# Patient Record
Sex: Male | Born: 1935 | Race: White | Hispanic: No | Marital: Married | Smoking: Current every day smoker
Health system: Southern US, Community
[De-identification: ages and names within clinical notes are randomized; demographics above are authoritative.]

## PROBLEM LIST (undated history)

## (undated) DIAGNOSIS — C801 Malignant (primary) neoplasm, unspecified: Secondary | ICD-10-CM

## (undated) DIAGNOSIS — E119 Type 2 diabetes mellitus without complications: Secondary | ICD-10-CM

## (undated) DIAGNOSIS — A491 Streptococcal infection, unspecified site: Secondary | ICD-10-CM

## (undated) DIAGNOSIS — I10 Essential (primary) hypertension: Secondary | ICD-10-CM

## (undated) DIAGNOSIS — E785 Hyperlipidemia, unspecified: Secondary | ICD-10-CM

## (undated) DIAGNOSIS — E079 Disorder of thyroid, unspecified: Secondary | ICD-10-CM

## (undated) DIAGNOSIS — I219 Acute myocardial infarction, unspecified: Secondary | ICD-10-CM

## (undated) HISTORY — PX: SPLENECTOMY: SUR1306

## (undated) HISTORY — PX: CORONARY ARTERY BYPASS GRAFT: SHX141

## (undated) HISTORY — PX: PANCREAS SURGERY: SHX731

## (undated) HISTORY — PX: CORONARY ANGIOPLASTY WITH STENT PLACEMENT: SHX49

## (undated) HISTORY — PX: CARDIAC SURGERY: SHX584

## (undated) HISTORY — PX: CARDIAC ELECTROPHYSIOLOGY STUDY AND ABLATION: SHX1294

---

## 2004-05-08 ENCOUNTER — Ambulatory Visit: Payer: Self-pay | Admitting: Physician Assistant

## 2004-05-21 ENCOUNTER — Ambulatory Visit: Payer: Self-pay | Admitting: Pain Medicine

## 2004-06-15 ENCOUNTER — Ambulatory Visit: Payer: Self-pay | Admitting: Physician Assistant

## 2004-07-12 ENCOUNTER — Ambulatory Visit: Payer: Self-pay | Admitting: Physician Assistant

## 2004-07-23 ENCOUNTER — Ambulatory Visit: Payer: Self-pay | Admitting: Pain Medicine

## 2004-07-24 ENCOUNTER — Ambulatory Visit: Payer: Self-pay | Admitting: Pain Medicine

## 2004-08-14 ENCOUNTER — Ambulatory Visit: Payer: Self-pay | Admitting: Physician Assistant

## 2004-08-21 ENCOUNTER — Ambulatory Visit: Payer: Self-pay | Admitting: Pain Medicine

## 2004-08-30 ENCOUNTER — Ambulatory Visit: Payer: Self-pay | Admitting: Pain Medicine

## 2004-10-01 ENCOUNTER — Ambulatory Visit: Payer: Self-pay | Admitting: Pain Medicine

## 2005-02-27 ENCOUNTER — Ambulatory Visit: Payer: Self-pay | Admitting: Pain Medicine

## 2005-03-11 ENCOUNTER — Ambulatory Visit: Payer: Self-pay | Admitting: Pain Medicine

## 2005-03-12 ENCOUNTER — Ambulatory Visit: Payer: Self-pay | Admitting: Pain Medicine

## 2005-04-03 ENCOUNTER — Ambulatory Visit: Payer: Self-pay | Admitting: Physician Assistant

## 2005-04-16 ENCOUNTER — Ambulatory Visit: Payer: Self-pay | Admitting: Pain Medicine

## 2005-05-02 ENCOUNTER — Ambulatory Visit: Payer: Self-pay | Admitting: Pain Medicine

## 2005-05-15 ENCOUNTER — Ambulatory Visit: Payer: Self-pay | Admitting: Physician Assistant

## 2005-08-29 ENCOUNTER — Ambulatory Visit: Payer: Self-pay | Admitting: Physician Assistant

## 2005-09-16 ENCOUNTER — Ambulatory Visit: Payer: Self-pay | Admitting: Pain Medicine

## 2005-09-17 ENCOUNTER — Ambulatory Visit: Payer: Self-pay | Admitting: Pain Medicine

## 2005-10-09 ENCOUNTER — Ambulatory Visit: Payer: Self-pay | Admitting: Physician Assistant

## 2006-01-13 ENCOUNTER — Ambulatory Visit: Payer: Self-pay | Admitting: Pain Medicine

## 2006-01-21 ENCOUNTER — Ambulatory Visit: Payer: Self-pay | Admitting: Pain Medicine

## 2006-02-10 ENCOUNTER — Ambulatory Visit: Payer: Self-pay | Admitting: Physician Assistant

## 2006-03-05 ENCOUNTER — Ambulatory Visit: Payer: Self-pay | Admitting: Internal Medicine

## 2006-04-17 ENCOUNTER — Ambulatory Visit: Payer: Self-pay | Admitting: Gastroenterology

## 2006-12-12 ENCOUNTER — Emergency Department: Payer: Self-pay | Admitting: Emergency Medicine

## 2006-12-12 ENCOUNTER — Other Ambulatory Visit: Payer: Self-pay

## 2007-05-12 ENCOUNTER — Other Ambulatory Visit: Payer: Self-pay

## 2007-05-12 ENCOUNTER — Emergency Department: Payer: Self-pay | Admitting: Emergency Medicine

## 2007-06-10 ENCOUNTER — Other Ambulatory Visit: Payer: Self-pay

## 2007-06-10 ENCOUNTER — Emergency Department: Payer: Self-pay

## 2007-06-23 ENCOUNTER — Ambulatory Visit: Payer: Self-pay

## 2007-06-24 ENCOUNTER — Ambulatory Visit: Payer: Self-pay | Admitting: Pain Medicine

## 2007-07-14 ENCOUNTER — Ambulatory Visit: Payer: Self-pay | Admitting: Pain Medicine

## 2007-07-27 ENCOUNTER — Ambulatory Visit: Payer: Self-pay | Admitting: Physician Assistant

## 2007-08-12 ENCOUNTER — Ambulatory Visit: Payer: Self-pay | Admitting: General Practice

## 2007-08-19 ENCOUNTER — Ambulatory Visit: Payer: Self-pay | Admitting: General Practice

## 2007-09-02 ENCOUNTER — Ambulatory Visit: Payer: Self-pay | Admitting: Physician Assistant

## 2007-09-09 IMAGING — CR DG ABDOMEN 3V
1 series · 5 of 5 positions shown · non-contrast
Comparison: none

REASON FOR EXAM: vomiting, hx multiple abd surgery, rm 21
COMMENTS:

[Series 1: view not recorded · 0.17mm/px · 5 of 5 slices shown]
[im 1/5]
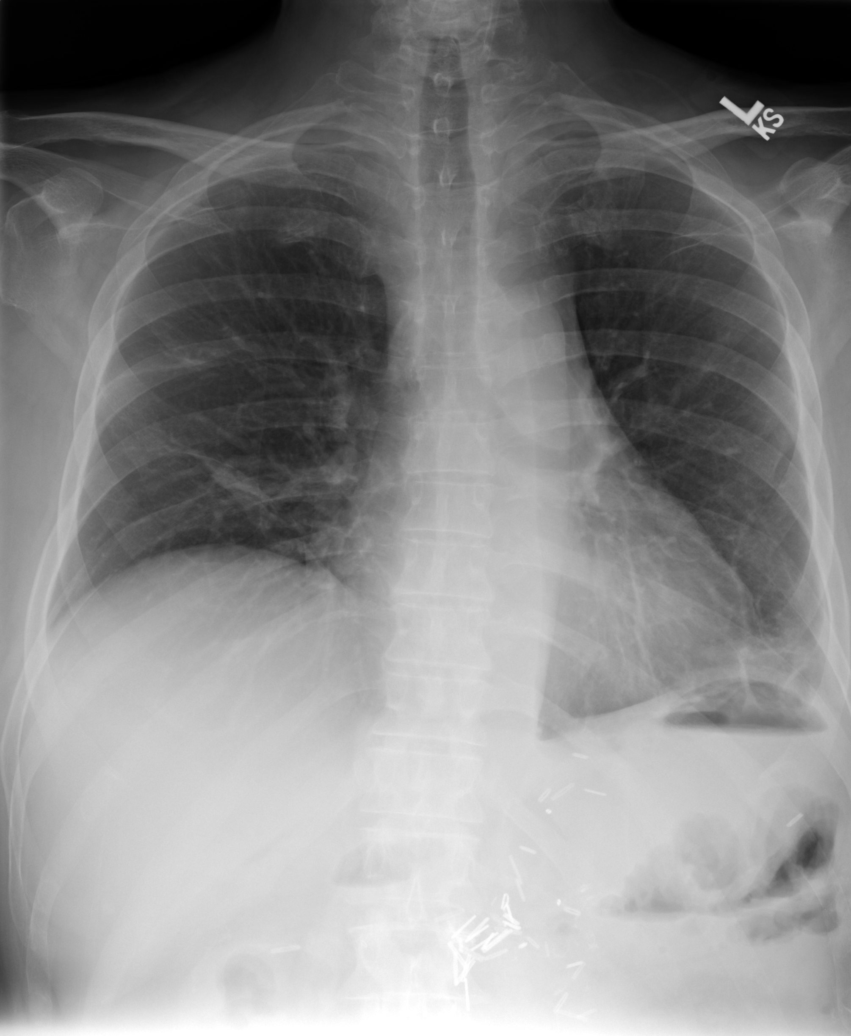
[im 2/5]
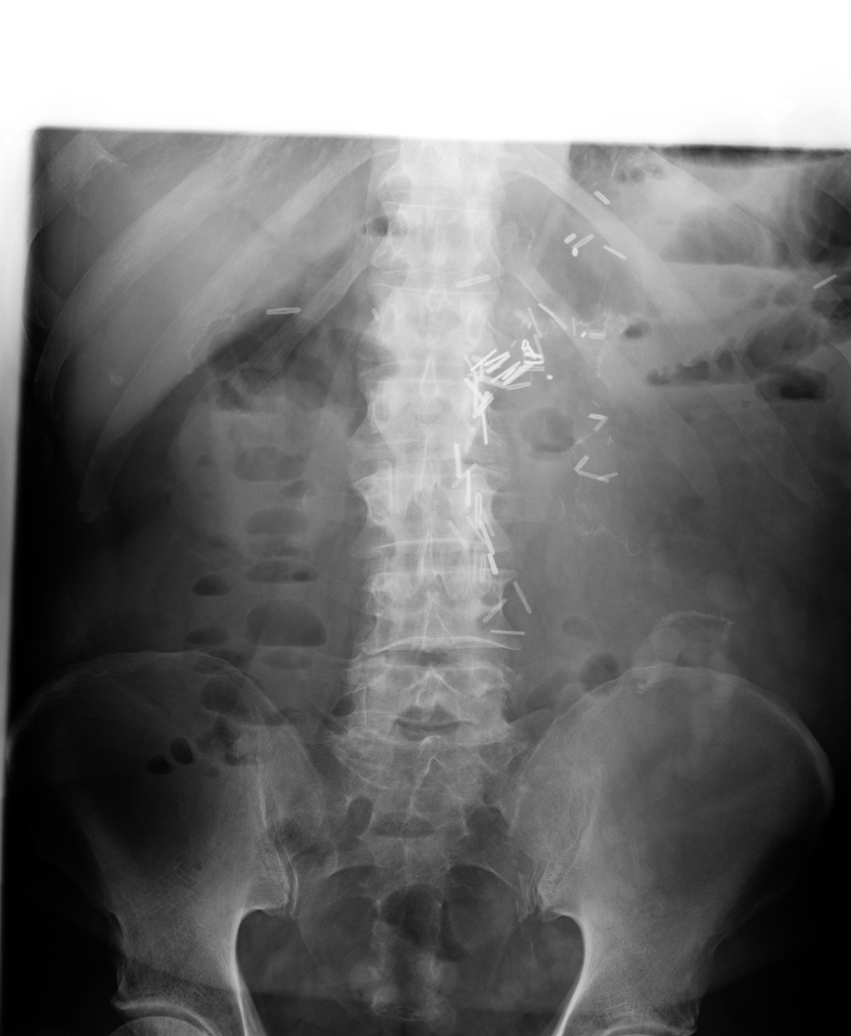
[im 3/5]
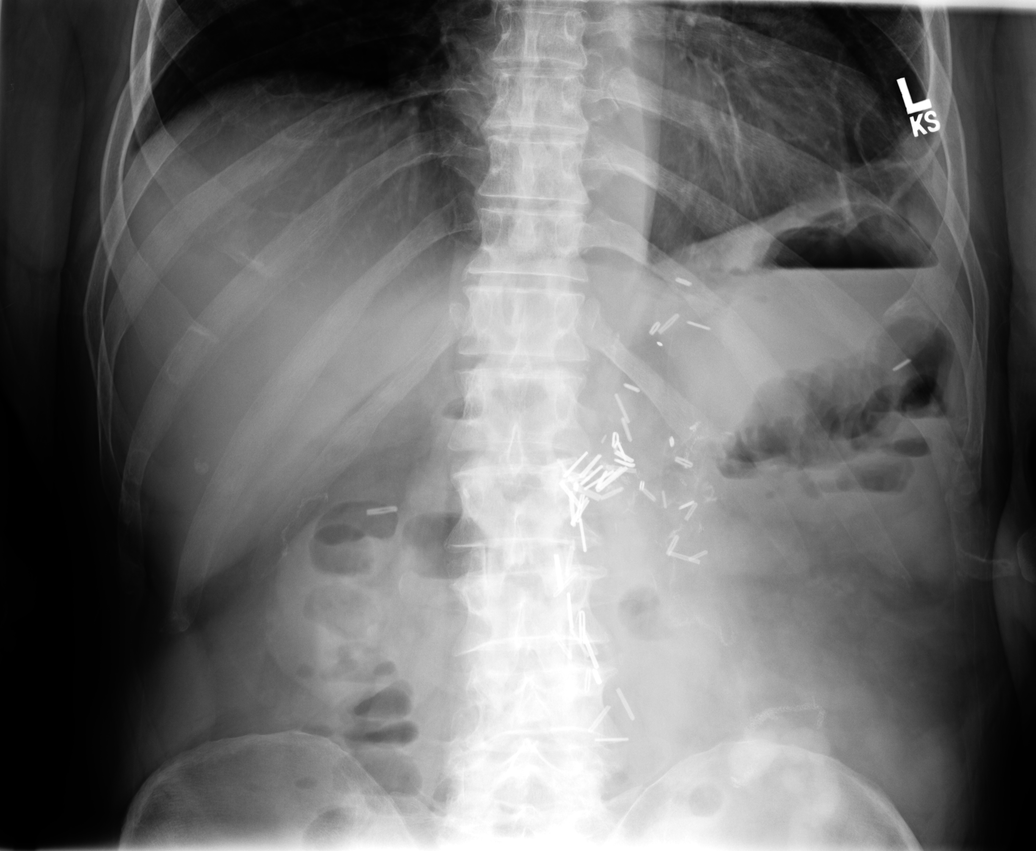
[im 4/5]
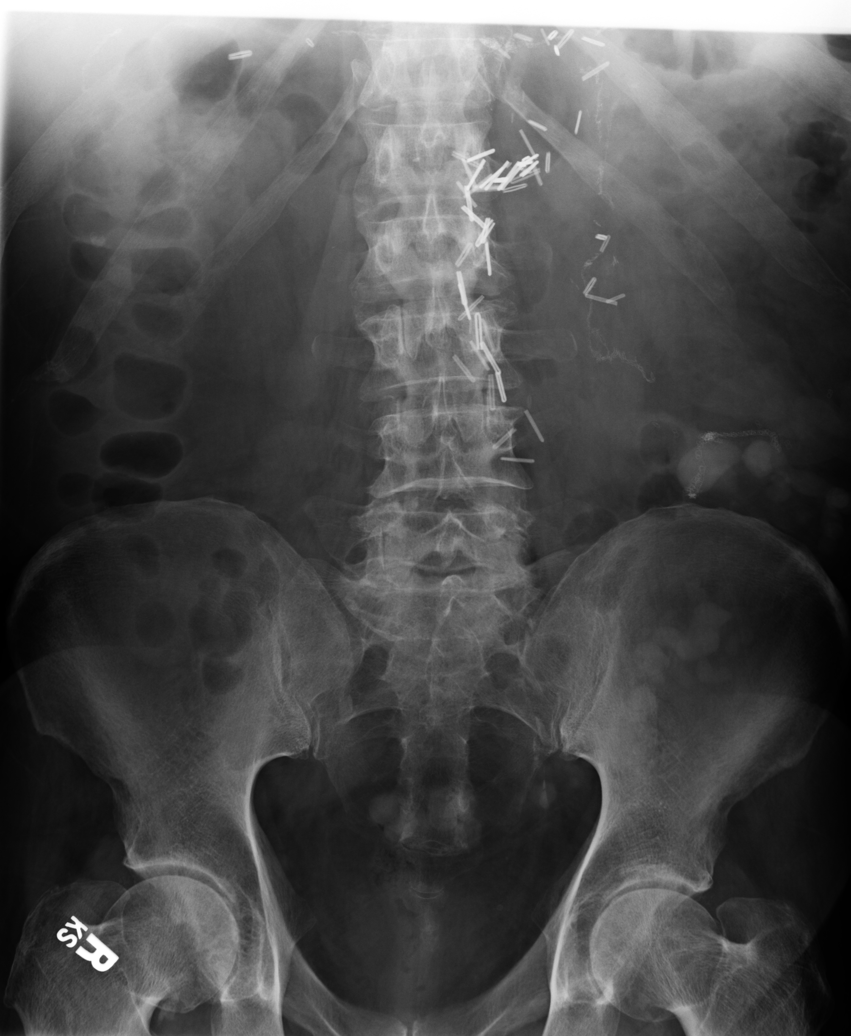
[im 5/5]
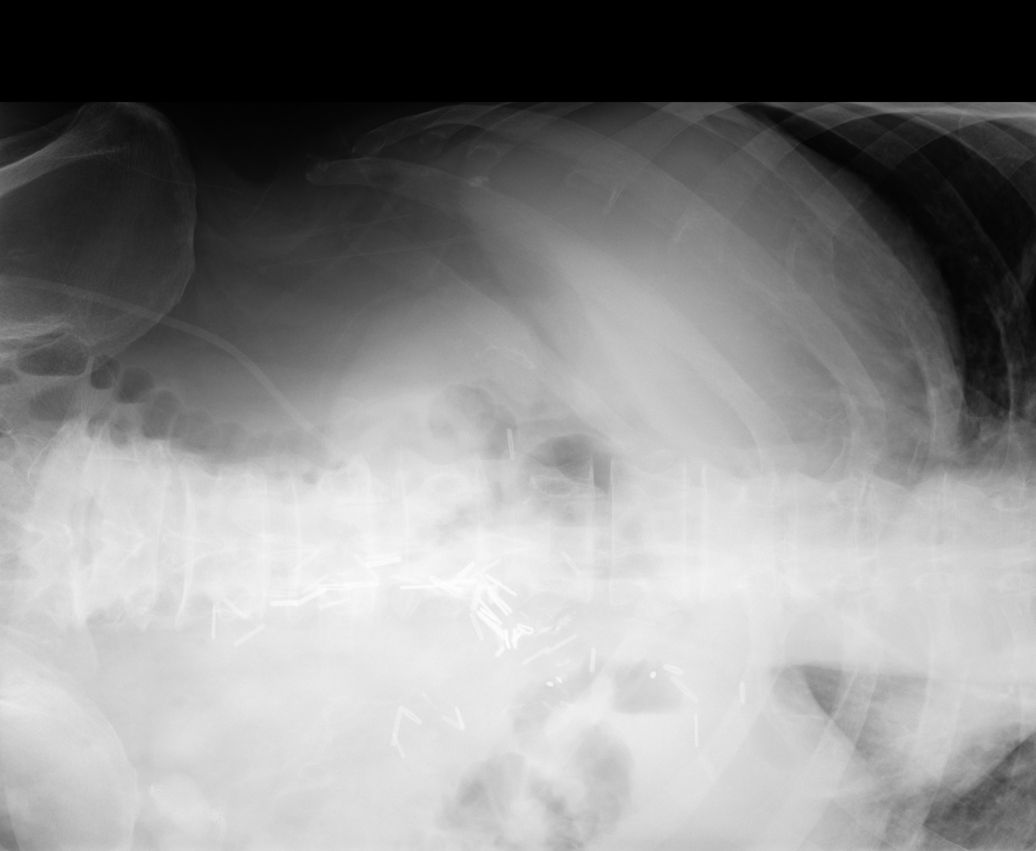

[5 of 5 positions shown; findings below may reference images not displayed]

PROCEDURE:     DXR - DXR ABDOMEN 3-WAY (INCL PA CXR)  - December 12, 2006  [DATE]

RESULT:      A single frontal view of the chest demonstrates elevation of
the RIGHT hemidiaphragm. There is increased density within the RIGHT and
LEFT lung bases.  No focal regions of consolidation are demonstrated.

Air is seen within nondilated loops of large and small bowel though there
are air-fluid levels appreciated. There does appear to be an element of
bowel gas in the region of the distal bowel.  The visualized bony skeleton
demonstrates no evidence of fracture or dislocation. There also appears to
be evidence of residual contrast in the region of the distal bowel. There
does not appear to be evidence of free air.
IMPRESSION: 1. Atelectasis versus infiltrate within the lung bases.
2. Nonspecific bowel gas pattern though there is evidence of air-fluid
levels and an early or mild ileus versus an early or partial small bowel
obstruction cannot be completely excluded and surveillance evaluation
recommended.

## 2007-09-26 ENCOUNTER — Ambulatory Visit: Payer: Self-pay | Admitting: Internal Medicine

## 2010-02-09 ENCOUNTER — Other Ambulatory Visit: Payer: Self-pay | Admitting: Internal Medicine

## 2011-03-12 DIAGNOSIS — I6529 Occlusion and stenosis of unspecified carotid artery: Secondary | ICD-10-CM | POA: Insufficient documentation

## 2011-03-12 DIAGNOSIS — K5909 Other constipation: Secondary | ICD-10-CM | POA: Insufficient documentation

## 2011-03-12 DIAGNOSIS — Z8679 Personal history of other diseases of the circulatory system: Secondary | ICD-10-CM | POA: Insufficient documentation

## 2011-03-12 DIAGNOSIS — I519 Heart disease, unspecified: Secondary | ICD-10-CM | POA: Insufficient documentation

## 2011-03-12 DIAGNOSIS — E7800 Pure hypercholesterolemia, unspecified: Secondary | ICD-10-CM | POA: Insufficient documentation

## 2011-03-12 DIAGNOSIS — E213 Hyperparathyroidism, unspecified: Secondary | ICD-10-CM | POA: Insufficient documentation

## 2011-03-12 DIAGNOSIS — N529 Male erectile dysfunction, unspecified: Secondary | ICD-10-CM | POA: Insufficient documentation

## 2011-12-07 ENCOUNTER — Ambulatory Visit: Payer: Self-pay | Admitting: Medical

## 2013-05-20 DIAGNOSIS — K8681 Exocrine pancreatic insufficiency: Secondary | ICD-10-CM | POA: Insufficient documentation

## 2013-08-12 DIAGNOSIS — M6289 Other specified disorders of muscle: Secondary | ICD-10-CM | POA: Insufficient documentation

## 2014-03-02 DIAGNOSIS — M7061 Trochanteric bursitis, right hip: Secondary | ICD-10-CM | POA: Insufficient documentation

## 2014-05-11 DIAGNOSIS — G2581 Restless legs syndrome: Secondary | ICD-10-CM | POA: Insufficient documentation

## 2014-12-27 DIAGNOSIS — M160 Bilateral primary osteoarthritis of hip: Secondary | ICD-10-CM | POA: Insufficient documentation

## 2015-02-07 ENCOUNTER — Encounter: Payer: Self-pay | Admitting: Emergency Medicine

## 2015-02-07 ENCOUNTER — Emergency Department
Admission: EM | Admit: 2015-02-07 | Discharge: 2015-02-07 | Disposition: A | Discharge status: Home / Self Care | Payer: Medicare Other | Attending: Emergency Medicine | Admitting: Emergency Medicine

## 2015-02-07 ENCOUNTER — Emergency Department: Payer: Medicare Other

## 2015-02-07 ENCOUNTER — Other Ambulatory Visit: Payer: Self-pay

## 2015-02-07 DIAGNOSIS — R55 Syncope and collapse: Secondary | ICD-10-CM | POA: Diagnosis not present

## 2015-02-07 DIAGNOSIS — Z7982 Long term (current) use of aspirin: Secondary | ICD-10-CM | POA: Diagnosis not present

## 2015-02-07 DIAGNOSIS — R531 Weakness: Secondary | ICD-10-CM | POA: Diagnosis present

## 2015-02-07 DIAGNOSIS — Z720 Tobacco use: Secondary | ICD-10-CM | POA: Diagnosis not present

## 2015-02-07 DIAGNOSIS — Z79899 Other long term (current) drug therapy: Secondary | ICD-10-CM | POA: Diagnosis not present

## 2015-02-07 DIAGNOSIS — I10 Essential (primary) hypertension: Secondary | ICD-10-CM | POA: Diagnosis not present

## 2015-02-07 DIAGNOSIS — E119 Type 2 diabetes mellitus without complications: Secondary | ICD-10-CM | POA: Insufficient documentation

## 2015-02-07 HISTORY — DX: Essential (primary) hypertension: I10

## 2015-02-07 HISTORY — DX: Streptococcal infection, unspecified site: A49.1

## 2015-02-07 HISTORY — DX: Acute myocardial infarction, unspecified: I21.9

## 2015-02-07 HISTORY — DX: Type 2 diabetes mellitus without complications: E11.9

## 2015-02-07 HISTORY — DX: Disorder of thyroid, unspecified: E07.9

## 2015-02-07 LAB — CBC WITH DIFFERENTIAL/PLATELET
Basophils Absolute: 0.1 10*3/uL (ref 0–0.1)
Basophils Relative: 1 %
Eosinophils Absolute: 0.1 10*3/uL (ref 0–0.7)
Eosinophils Relative: 1 %
HEMATOCRIT: 37.8 % — AB (ref 40.0–52.0)
HEMOGLOBIN: 12.4 g/dL — AB (ref 13.0–18.0)
LYMPHS ABS: 3.6 10*3/uL (ref 1.0–3.6)
Lymphocytes Relative: 21 %
MCH: 32.3 pg (ref 26.0–34.0)
MCHC: 32.7 g/dL (ref 32.0–36.0)
MCV: 98.8 fL (ref 80.0–100.0)
Monocytes Absolute: 1.7 10*3/uL — ABNORMAL HIGH (ref 0.2–1.0)
Monocytes Relative: 9 %
Neutro Abs: 12.1 10*3/uL — ABNORMAL HIGH (ref 1.4–6.5)
Neutrophils Relative %: 68 %
Platelets: 245 10*3/uL (ref 150–440)
RBC: 3.83 MIL/uL — AB (ref 4.40–5.90)
RDW: 12.9 % (ref 11.5–14.5)
WBC: 17.5 10*3/uL — AB (ref 3.8–10.6)

## 2015-02-07 LAB — URINALYSIS COMPLETE WITH MICROSCOPIC (ARMC ONLY)
BILIRUBIN URINE: NEGATIVE
Bacteria, UA: NONE SEEN
Glucose, UA: NEGATIVE mg/dL
HGB URINE DIPSTICK: NEGATIVE
Ketones, ur: NEGATIVE mg/dL
Leukocytes, UA: NEGATIVE
NITRITE: NEGATIVE
PROTEIN: 30 mg/dL — AB
Specific Gravity, Urine: 1.018 (ref 1.005–1.030)
pH: 5 (ref 5.0–8.0)

## 2015-02-07 LAB — COMPREHENSIVE METABOLIC PANEL
ALBUMIN: 3.6 g/dL (ref 3.5–5.0)
ALK PHOS: 73 U/L (ref 38–126)
ALT: 13 U/L — ABNORMAL LOW (ref 17–63)
ANION GAP: 8 (ref 5–15)
AST: 19 U/L (ref 15–41)
BUN: 28 mg/dL — AB (ref 6–20)
CALCIUM: 9.6 mg/dL (ref 8.9–10.3)
CO2: 23 mmol/L (ref 22–32)
Chloride: 108 mmol/L (ref 101–111)
Creatinine, Ser: 1.91 mg/dL — ABNORMAL HIGH (ref 0.61–1.24)
GFR calc non Af Amer: 32 mL/min — ABNORMAL LOW (ref >60)
GFR, EST AFRICAN AMERICAN: 37 mL/min — AB (ref >60)
GLUCOSE: 113 mg/dL — AB (ref 65–99)
Potassium: 4.2 mmol/L (ref 3.5–5.1)
Sodium: 139 mmol/L (ref 135–145)
Total Bilirubin: 0.6 mg/dL (ref 0.3–1.2)
Total Protein: 6.4 g/dL — ABNORMAL LOW (ref 6.5–8.1)

## 2015-02-07 LAB — TROPONIN I: Troponin I: <0.03 ng/mL (ref <0.031)

## 2015-02-07 LAB — CK: CK TOTAL: 33 U/L — AB (ref 49–397)

## 2015-02-07 MED ORDER — SODIUM CHLORIDE 0.9 % IV SOLN
Freq: Once | INTRAVENOUS | Status: AC
  Administered 2015-02-07: 19:00:00 via INTRAVENOUS

## 2015-02-07 NOTE — ED Provider Notes (Signed)
[facility] Emergency Department Provider Note  ____________________________________________  Time seen: Approximately 1:56 PM  I have reviewed the triage vital signs and the nursing notes.   HISTORY  Chief Complaint Weakness   HPI Daniel Bray is a 80 y.o. male who was mowing the yard. He took a break went up on the porch sat on his rocking chair and passed out. He felt very weak. She denied any nausea vomiting shortness of breath usual sweating or chest discomfort. A reportedly had a very low blood pressure when EMS got there he feels much better now. Fortunately been going for about half an hour. Since past history significant for diabetes hypothyroidism needing hip replacement and he said he had stents and a CABG last April but last month he saw his cardiologist to cleared him told him he was in good shape and could have surgery on his hip. Today it is very hot and humid outside.   Past Medical History  Diagnosis Date  . Diabetes mellitus without complication   . Hypertension   . Thyroid disease   . Streptococcal infection     Strep Bovis  . Myocardial infarction     There are no active problems to display for this patient.   Past Surgical History  Procedure Laterality Date  . Cardiac surgery    . Coronary artery bypass graft      x3  . Coronary angioplasty with stent placement      Current Outpatient Rx  Name  Route  Sig  Dispense  Refill  . amLODipine (NORVASC) 5 MG tablet   Oral   Take 5 mg by mouth daily.         Marland Kitchen aspirin EC 81 MG tablet   Oral   Take 81 mg by mouth daily.         . carvedilol (COREG) 25 MG tablet   Oral   Take 25 mg by mouth 2 (two) times daily.         Marland Kitchen FLORA-Q (FLORA-Q) CAPS capsule   Oral   Take 1 capsule by mouth daily.         Marland Kitchen glipiZIDE (GLUCOTROL) 5 MG tablet   Oral   Take by mouth daily before breakfast.         . levothyroxine (SYNTHROID, LEVOTHROID) 100 MCG tablet   Oral   Take  100 mcg by mouth daily before breakfast.         . lisinopril (PRINIVIL,ZESTRIL) 10 MG tablet   Oral   Take 10 mg by mouth 2 (two) times daily.         . Multiple Vitamin (MULTIVITAMIN) tablet   Oral   Take 1 tablet by mouth daily.         Marland Kitchen omeprazole (PRILOSEC) 40 MG capsule   Oral   Take 40 mg by mouth daily.         . simvastatin (ZOCOR) 40 MG tablet   Oral   Take 40 mg by mouth daily.           Allergies Review of patient's allergies indicates no known allergies.  Family History  Problem Relation Age of Onset  . Family history unknown: Yes    Social History History  Substance Use Topics  . Smoking status: Current Every Day Smoker -- 1.00 packs/day    Types: Cigars  . Smokeless tobacco: Current User    Types: Chew  . Alcohol Use: No    Review of Systems  Constitutional: No fever/chills Eyes: No visual changes. ENT: No sore throat. Cardiovascular: Denies chest pain. Respiratory: Denies shortness of breath. Gastrointestinal: No abdominal pain.  No nausea, no vomiting.  No diarrhea.  No constipation. Genitourinary: Negative for dysuria. Musculoskeletal: Negative for back pain. Skin: Negative for rash. Neurological: Negative for headaches, focal weakness or numbness.  10-point ROS otherwise negative.  ____________________________________________   PHYSICAL EXAM:  VITAL SIGNS: ED Triage Vitals  Enc Vitals Group     BP 02/07/15 1348 121/69 mmHg     Pulse Rate 02/07/15 1348 56     Resp 02/07/15 1348 18     Temp 02/07/15 1348 97.6 F (36.4 C)     Temp Source 02/07/15 1348 Oral     SpO2 02/07/15 1343 99 %     Weight --      Height --      Head Cir --      Peak Flow --      Pain Score --      Pain Loc --      Pain Edu? --      Excl. in [place]? --     Constitutional: Alert and oriented. Well appearing and in no acute distress. Eyes: Conjunctivae are normal. PERRL. EOMI. Head: Atraumatic. Nose: No congestion/rhinnorhea. Mouth/Throat:  Mucous membranes are moist.  Oropharynx non-erythematous. Neck: No stridor. Cardiovascular: Normal rate, regular rhythm. Grossly normal heart sounds.  Good peripheral circulation. Respiratory: Normal respiratory effort.  No retractions. Lungs CTAB. Gastrointestinal: Soft and nontender. No distention. No abdominal bruits. No CVA tenderness. Musculoskeletal: No lower extremity tenderness nor edema.  No joint effusions. Neurologic:  Normal speech and language. No gross focal neurologic deficits are appreciated. Speech is normal. No gait instability. Skin:  Skin is warm, dry and intact. No rash noted. Psychiatric: Mood and affect are normal. Speech and behavior are normal.  ____________________________________________   LABS (all labs ordered are listed, but only abnormal results are displayed)  Labs Reviewed  COMPREHENSIVE METABOLIC PANEL - Abnormal; Notable for the following:    Glucose, Bld 113 (*)    BUN 28 (*)    Creatinine, Ser 1.91 (*)    Total Protein 6.4 (*)    ALT 13 (*)    GFR calc non Af Amer 32 (*)    GFR calc Af Amer 37 (*)    All other components within normal limits  CBC WITH DIFFERENTIAL/PLATELET - Abnormal; Notable for the following:    WBC 17.5 (*)    RBC 3.83 (*)    Hemoglobin 12.4 (*)    HCT 37.8 (*)    Neutro Abs 12.1 (*)    Monocytes Absolute 1.7 (*)    All other components within normal limits  URINALYSIS COMPLETEWITH MICROSCOPIC ([facility] ONLY) - Abnormal; Notable for the following:    Color, Urine YELLOW (*)    APPearance CLEAR (*)    Protein, ur 30 (*)    Squamous Epithelial / LPF 0-5 (*)    All other components within normal limits  CK - Abnormal; Notable for the following:    Total CK 33 (*)    All other components within normal limits  TROPONIN I  TROPONIN I   ____________________________________________  EKG  EKG read and interpreted by me. Sinus bradycardia at a rate of 57 left axis no acute changes normal  intervals ____________________________________________  RADIOLOGY  Chest x-ray read and interpreted by me some atelectasis in the left base otherwise no acute disease ____________________________________________   PROCEDURES   ____________________________________________  INITIAL IMPRESSION / ASSESSMENT AND PLAN / ED COURSE  Pertinent labs & imaging results that were available during my care of the patient were reviewed by me and considered in my medical decision making (see chart for details).   ____________________________________________   FINAL CLINICAL IMPRESSION(S) / ED DIAGNOSES  Final diagnoses:  Collapse      Nena Polio, MD 02/07/15 2350

## 2015-02-07 NOTE — ED Notes (Addendum)
Pt arrived via EMS from home after he was outside mowing the grass.  He started feeling weak and over heated. Patient said he was sitting on the porch and felt weak.  Family reports he passed out. Pt does not remember passing out.  Pt reports blood pressure gets low sometimes and when he sits down it usually will go back up.  Family states he actually passed out x2.  Family reports they had trouble getting a blood pressure initially.  When patient was passed out he started to shake a bit.

## 2015-04-06 DIAGNOSIS — Z96641 Presence of right artificial hip joint: Secondary | ICD-10-CM | POA: Insufficient documentation

## 2015-05-01 DIAGNOSIS — M1711 Unilateral primary osteoarthritis, right knee: Secondary | ICD-10-CM | POA: Insufficient documentation

## 2015-06-28 DIAGNOSIS — M4807 Spinal stenosis, lumbosacral region: Secondary | ICD-10-CM | POA: Insufficient documentation

## 2015-08-06 DIAGNOSIS — R002 Palpitations: Secondary | ICD-10-CM | POA: Insufficient documentation

## 2016-06-24 ENCOUNTER — Emergency Department
Admission: EM | Admit: 2016-06-24 | Discharge: 2016-06-24 | Disposition: A | Discharge status: Home / Self Care | Payer: Medicare HMO | Attending: Emergency Medicine | Admitting: Emergency Medicine

## 2016-06-24 DIAGNOSIS — E119 Type 2 diabetes mellitus without complications: Secondary | ICD-10-CM | POA: Diagnosis not present

## 2016-06-24 DIAGNOSIS — Z7984 Long term (current) use of oral hypoglycemic drugs: Secondary | ICD-10-CM | POA: Diagnosis not present

## 2016-06-24 DIAGNOSIS — F17290 Nicotine dependence, other tobacco product, uncomplicated: Secondary | ICD-10-CM | POA: Insufficient documentation

## 2016-06-24 DIAGNOSIS — Z8679 Personal history of other diseases of the circulatory system: Secondary | ICD-10-CM | POA: Insufficient documentation

## 2016-06-24 DIAGNOSIS — Z79899 Other long term (current) drug therapy: Secondary | ICD-10-CM | POA: Diagnosis not present

## 2016-06-24 DIAGNOSIS — Z7982 Long term (current) use of aspirin: Secondary | ICD-10-CM | POA: Insufficient documentation

## 2016-06-24 DIAGNOSIS — F17220 Nicotine dependence, chewing tobacco, uncomplicated: Secondary | ICD-10-CM | POA: Insufficient documentation

## 2016-06-24 DIAGNOSIS — R55 Syncope and collapse: Secondary | ICD-10-CM | POA: Diagnosis present

## 2016-06-24 DIAGNOSIS — I252 Old myocardial infarction: Secondary | ICD-10-CM | POA: Diagnosis not present

## 2016-06-24 DIAGNOSIS — I959 Hypotension, unspecified: Secondary | ICD-10-CM | POA: Insufficient documentation

## 2016-06-24 LAB — CBC
HCT: 32.1 % — ABNORMAL LOW (ref 40.0–52.0)
Hemoglobin: 10.5 g/dL — ABNORMAL LOW (ref 13.0–18.0)
MCH: 28.3 pg (ref 26.0–34.0)
MCHC: 32.7 g/dL (ref 32.0–36.0)
MCV: 86.5 fL (ref 80.0–100.0)
PLATELETS: 313 10*3/uL (ref 150–440)
RBC: 3.71 MIL/uL — AB (ref 4.40–5.90)
RDW: 16.3 % — ABNORMAL HIGH (ref 11.5–14.5)
WBC: 13.3 10*3/uL — AB (ref 3.8–10.6)

## 2016-06-24 LAB — COMPREHENSIVE METABOLIC PANEL
ALK PHOS: 58 U/L (ref 38–126)
ALT: 13 U/L — ABNORMAL LOW (ref 17–63)
ANION GAP: 8 (ref 5–15)
AST: 24 U/L (ref 15–41)
Albumin: 3.5 g/dL (ref 3.5–5.0)
BILIRUBIN TOTAL: 0.3 mg/dL (ref 0.3–1.2)
BUN: 31 mg/dL — ABNORMAL HIGH (ref 6–20)
CALCIUM: 9.6 mg/dL (ref 8.9–10.3)
CO2: 20 mmol/L — ABNORMAL LOW (ref 22–32)
Chloride: 107 mmol/L (ref 101–111)
Creatinine, Ser: 2.07 mg/dL — ABNORMAL HIGH (ref 0.61–1.24)
GFR, EST AFRICAN AMERICAN: 34 mL/min — AB (ref >60)
GFR, EST NON AFRICAN AMERICAN: 29 mL/min — AB (ref >60)
Glucose, Bld: 152 mg/dL — ABNORMAL HIGH (ref 65–99)
POTASSIUM: 4.7 mmol/L (ref 3.5–5.1)
Sodium: 135 mmol/L (ref 135–145)
TOTAL PROTEIN: 6.3 g/dL — AB (ref 6.5–8.1)

## 2016-06-24 LAB — TROPONIN I
TROPONIN I: 0.03 ng/mL — AB (ref <0.03)
TROPONIN I: 0.03 ng/mL — AB (ref <0.03)

## 2016-06-24 MED ORDER — ONDANSETRON HCL 4 MG/2ML IJ SOLN
4.0000 mg | Freq: Once | INTRAMUSCULAR | Status: AC
  Administered 2016-06-24: 4 mg via INTRAVENOUS

## 2016-06-24 MED ORDER — SODIUM CHLORIDE 0.9 % IV BOLUS (SEPSIS)
500.0000 mL | Freq: Once | INTRAVENOUS | Status: AC
  Administered 2016-06-24: 500 mL via INTRAVENOUS

## 2016-06-24 NOTE — ED Notes (Signed)
Reported elevated troponin to Dr Archie Balboa

## 2016-06-24 NOTE — ED Provider Notes (Signed)
[facility] Emergency Department Provider Note  ____________________________________________   I have reviewed the triage vital signs and the nursing notes.   HISTORY  Chief Complaint Loss of Consciousness   History limited by: Not Limited   HPI Daniel Bray is a 81 y.o. male who presents to the emergency department today after an episode of weakness and hypotension. The patient states that for the past 2-3 weeks he has been having episodes of weakness. They appear to come and go at random. He states that he has seen a doctor since this started. He's been keeping along with his blood pressure. He was started on Flomax last week however these episodes started before he was put on the Flomax. Initially was taken off a number of his blood pressure medications. He was taken off his metoprolol but states he took half a tablet today when his heart rate was high. In addition he was instructed to decrease his dose of Synthroid. Patient denies any chest pain, shortness breath or fevers.   Past Medical History:  Diagnosis Date  . Diabetes mellitus without complication ([place])   . Hypertension   . Myocardial infarction   . Streptococcal infection    Strep Bovis  . Thyroid disease     There are no active problems to display for this patient.   Past Surgical History:  Procedure Laterality Date  . CARDIAC SURGERY    . CORONARY ANGIOPLASTY WITH STENT PLACEMENT    . CORONARY ARTERY BYPASS GRAFT     x3    Prior to Admission medications   Medication Sig Start Date End Date Taking? Authorizing Provider  amLODipine (NORVASC) 5 MG tablet Take 5 mg by mouth daily.    Historical Provider, MD  aspirin EC 81 MG tablet Take 81 mg by mouth daily.    Historical Provider, MD  carvedilol (COREG) 25 MG tablet Take 25 mg by mouth 2 (two) times daily.    Historical Provider, MD  FLORA-Q [facility]) CAPS capsule Take 1 capsule by mouth daily.    Historical Provider, MD   glipiZIDE (GLUCOTROL) 5 MG tablet Take by mouth daily before breakfast.    Historical Provider, MD  levothyroxine (SYNTHROID, LEVOTHROID) 100 MCG tablet Take 100 mcg by mouth daily before breakfast.    Historical Provider, MD  lisinopril (PRINIVIL,ZESTRIL) 10 MG tablet Take 10 mg by mouth 2 (two) times daily.    Historical Provider, MD  Multiple Vitamin (MULTIVITAMIN) tablet Take 1 tablet by mouth daily.    Historical Provider, MD  omeprazole (PRILOSEC) 40 MG capsule Take 40 mg by mouth daily.    Historical Provider, MD  simvastatin (ZOCOR) 40 MG tablet Take 40 mg by mouth daily.    Historical Provider, MD    Allergies Patient has no known allergies.  Family History  Problem Relation Age of Onset  . Family history unknown: Yes    Social History Social History  Substance Use Topics  . Smoking status: Current Every Day Smoker    Packs/day: 1.00    Types: Cigars  . Smokeless tobacco: Current User    Types: Chew  . Alcohol use No    Review of Systems  Constitutional: Negative for fever. Cardiovascular: Negative for chest pain. Respiratory: Negative for shortness of breath. Gastrointestinal: Negative for abdominal pain, vomiting and diarrhea. Neurological: Negative for headaches, focal weakness or numbness.  10-point ROS otherwise negative.  ____________________________________________   PHYSICAL EXAM:  VITAL SIGNS: ED Triage Vitals  Enc Vitals Group  BP 06/24/16 1430 (!) 156/89     Pulse Rate 06/24/16 1430 79     Resp 06/24/16 1430 16     Temp --      Temp src --      SpO2 06/24/16 1425 99 %     Weight 06/24/16 1431 180 lb (81.6 kg)     Height 06/24/16 1431 5\' 9"  (1.753 m)     Head Circumference --      Peak Flow --      Pain Score 06/24/16 1431 0   Constitutional: Alert and oriented. Well appearing and in no distress. Eyes: Conjunctivae are normal. Normal extraocular movements. ENT   Head: Normocephalic and atraumatic.   Nose: No  congestion/rhinnorhea.   Mouth/Throat: Mucous membranes are moist.   Neck: No stridor. Hematological/Lymphatic/Immunilogical: No cervical lymphadenopathy. Cardiovascular: Normal rate, regular rhythm.  No murmurs, rubs, or gallops.  Respiratory: Normal respiratory effort without tachypnea nor retractions. Breath sounds are clear and equal bilaterally. No wheezes/rales/rhonchi. Gastrointestinal: Soft and nontender. No distention.  Genitourinary: Deferred Musculoskeletal: Normal range of motion in all extremities. No lower extremity edema. Neurologic:  Normal speech and language. No gross focal neurologic deficits are appreciated.  Skin:  Skin is warm, dry and intact. No rash noted. Psychiatric: Mood and affect are normal. Speech and behavior are normal. Patient exhibits appropriate insight and judgment.  ____________________________________________    LABS (pertinent positives/negatives)  Labs Reviewed  CBC - Abnormal; Notable for the following:       Result Value   WBC 13.3 (*)    RBC 3.71 (*)    Hemoglobin 10.5 (*)    HCT 32.1 (*)    RDW 16.3 (*)    All other components within normal limits  COMPREHENSIVE METABOLIC PANEL - Abnormal; Notable for the following:    CO2 20 (*)    Glucose, Bld 152 (*)    BUN 31 (*)    Creatinine, Ser 2.07 (*)    Total Protein 6.3 (*)    ALT 13 (*)    GFR calc non Af Amer 29 (*)    GFR calc Af Amer 34 (*)    All other components within normal limits  TROPONIN I - Abnormal; Notable for the following:    Troponin I 0.03 (*)    All other components within normal limits  TROPONIN I - Abnormal; Notable for the following:    Troponin I 0.03 (*)    All other components within normal limits     ____________________________________________   EKG  I, Nance Pear, attending physician, personally viewed and interpreted this EKG  EKG Time: 1436 Rate: 76 Rhythm: normal sinus rhythm with 1st degree AV block Axis: normal Intervals: qtc  438, 1st degree av block QRS: narrow, q waves V1 ST changes: no st elevation Impression: abnormal ekg ____________________________________________    RADIOLOGY  None   ____________________________________________   PROCEDURES  Procedures  ____________________________________________   INITIAL IMPRESSION / ASSESSMENT AND PLAN / ED COURSE  Pertinent labs & imaging results that were available during my care of the patient were reviewed by me and considered in my medical decision making (see chart for details).  Patient here after an episode of weakness and low blood pressure. Patient denies any infectious type symptoms. Patient does have a number of medication issues that could be causing some weakness including Flomax, decrease of his Synthroid and that he took some metoprolol today. Blood work with a mild leukocytosis of unclear significance. Additionally creatinine minimally elevated over  baseline. Troponin 0.03. Will plan on giving small fluid bolus, observing him, checking second troponin.  Clinical Course    Second troponin unchanged. At this point I doubt that it represents ACS. Think likely patient's episode related to medication use. Advised patient to stop flomax. Will have patient follow up with PCP. ____________________________________________   FINAL CLINICAL IMPRESSION(S) / ED DIAGNOSES  Final diagnoses:  Hypotension, unspecified hypotension type     Note: This dictation was prepared with Dragon dictation. Any transcriptional errors that result from this process are unintentional d   Nance Pear, MD 06/24/16 1901

## 2016-06-24 NOTE — ED Notes (Signed)
Pt brought in by ems for c/o syncope - episode lasted approx 15 minutes - pt has significant cardiac history so he takes his BP and pulse frequently - this am HR 120's - when ems arrived BP 80's/-, pulse 40's - pt reports being weak and this is off and on since medication change last Friday - cardiologist decreased Metoprolol from 25mg  to 12.5mg  and placed him on Flomax for prostate swelling - pt states taking these two medications together drops his BP - at this time pt is A&O x4 - respirations even and unlabored - denies any pain

## 2016-06-24 NOTE — ED Triage Notes (Signed)
Pt brought in by ems for c/o syncope - episode lasted approx 15 minutes - pt has significant cardiac history so he takes his BP and pulse frequently - this am HR 120's - when ems arrived BP 80's/-, pulse 40's - pt reports being weak and this is off and on since medication change last Friday - cardiologist decreased Metoprolol from 25mg  to 12.5mg  and placed him on Flomax for prostate swelling - pt states taking these two medications together drops his BP

## 2016-06-24 NOTE — ED Notes (Signed)
Pt given water 

## 2016-06-24 NOTE — Discharge Instructions (Signed)
As we discussed I would stop your Flomax. Instead I would continue the antibiotics (Levaquin). Please seek medical attention for any high fevers, chest pain, shortness of breath, change in behavior, persistent vomiting, bloody stool or any other new or concerning symptoms.

## 2016-08-13 ENCOUNTER — Emergency Department: Payer: Medicare HMO

## 2016-08-13 ENCOUNTER — Encounter: Payer: Self-pay | Admitting: Emergency Medicine

## 2016-08-13 ENCOUNTER — Emergency Department
Admission: EM | Admit: 2016-08-13 | Discharge: 2016-08-13 | Disposition: A | Discharge status: Home / Self Care | Payer: Medicare HMO | Attending: Emergency Medicine | Admitting: Emergency Medicine

## 2016-08-13 DIAGNOSIS — R55 Syncope and collapse: Secondary | ICD-10-CM

## 2016-08-13 DIAGNOSIS — Z7984 Long term (current) use of oral hypoglycemic drugs: Secondary | ICD-10-CM | POA: Insufficient documentation

## 2016-08-13 DIAGNOSIS — Z85528 Personal history of other malignant neoplasm of kidney: Secondary | ICD-10-CM | POA: Insufficient documentation

## 2016-08-13 DIAGNOSIS — I10 Essential (primary) hypertension: Secondary | ICD-10-CM | POA: Diagnosis not present

## 2016-08-13 DIAGNOSIS — E119 Type 2 diabetes mellitus without complications: Secondary | ICD-10-CM | POA: Diagnosis not present

## 2016-08-13 DIAGNOSIS — R197 Diarrhea, unspecified: Secondary | ICD-10-CM

## 2016-08-13 DIAGNOSIS — Z7982 Long term (current) use of aspirin: Secondary | ICD-10-CM | POA: Diagnosis not present

## 2016-08-13 DIAGNOSIS — R112 Nausea with vomiting, unspecified: Secondary | ICD-10-CM

## 2016-08-13 DIAGNOSIS — Z85038 Personal history of other malignant neoplasm of large intestine: Secondary | ICD-10-CM | POA: Diagnosis not present

## 2016-08-13 DIAGNOSIS — F17290 Nicotine dependence, other tobacco product, uncomplicated: Secondary | ICD-10-CM | POA: Diagnosis not present

## 2016-08-13 DIAGNOSIS — E860 Dehydration: Secondary | ICD-10-CM | POA: Diagnosis not present

## 2016-08-13 HISTORY — DX: Malignant (primary) neoplasm, unspecified: C80.1

## 2016-08-13 LAB — URINALYSIS, COMPLETE (UACMP) WITH MICROSCOPIC
BACTERIA UA: NONE SEEN
Bilirubin Urine: NEGATIVE
GLUCOSE, UA: NEGATIVE mg/dL
Hgb urine dipstick: NEGATIVE
KETONES UR: NEGATIVE mg/dL
Leukocytes, UA: NEGATIVE
Nitrite: NEGATIVE
PROTEIN: NEGATIVE mg/dL
SQUAMOUS EPITHELIAL / LPF: NONE SEEN
Specific Gravity, Urine: 1.015 (ref 1.005–1.030)
WBC UA: NONE SEEN WBC/hpf (ref 0–5)
pH: 5 (ref 5.0–8.0)

## 2016-08-13 LAB — CBC
HEMATOCRIT: 37.5 % — AB (ref 40.0–52.0)
HEMOGLOBIN: 11.6 g/dL — AB (ref 13.0–18.0)
MCH: 26.1 pg (ref 26.0–34.0)
MCHC: 30.8 g/dL — AB (ref 32.0–36.0)
MCV: 84.6 fL (ref 80.0–100.0)
Platelets: 319 10*3/uL (ref 150–440)
RBC: 4.43 MIL/uL (ref 4.40–5.90)
RDW: 16.8 % — AB (ref 11.5–14.5)
WBC: 22 10*3/uL — ABNORMAL HIGH (ref 3.8–10.6)

## 2016-08-13 LAB — COMPREHENSIVE METABOLIC PANEL
ALBUMIN: 4 g/dL (ref 3.5–5.0)
ALT: 25 U/L (ref 17–63)
ANION GAP: 9 (ref 5–15)
AST: 27 U/L (ref 15–41)
Alkaline Phosphatase: 48 U/L (ref 38–126)
BILIRUBIN TOTAL: 0.3 mg/dL (ref 0.3–1.2)
BUN: 42 mg/dL — AB (ref 6–20)
CO2: 22 mmol/L (ref 22–32)
Calcium: 9.4 mg/dL (ref 8.9–10.3)
Chloride: 105 mmol/L (ref 101–111)
Creatinine, Ser: 2.48 mg/dL — ABNORMAL HIGH (ref 0.61–1.24)
GFR calc Af Amer: 27 mL/min — ABNORMAL LOW (ref >60)
GFR calc non Af Amer: 23 mL/min — ABNORMAL LOW (ref >60)
GLUCOSE: 188 mg/dL — AB (ref 65–99)
POTASSIUM: 4.3 mmol/L (ref 3.5–5.1)
SODIUM: 136 mmol/L (ref 135–145)
Total Protein: 6.8 g/dL (ref 6.5–8.1)

## 2016-08-13 LAB — TROPONIN I: TROPONIN I: 0.03 ng/mL — AB (ref <0.03)

## 2016-08-13 LAB — LIPASE, BLOOD: Lipase: 14 U/L (ref 11–51)

## 2016-08-13 MED ORDER — GI COCKTAIL ~~LOC~~
30.0000 mL | Freq: Once | ORAL | Status: AC
  Administered 2016-08-13: 30 mL via ORAL
  Filled 2016-08-13: qty 30

## 2016-08-13 MED ORDER — ONDANSETRON 4 MG PO TBDP: 4.0000 mg | ORAL_TABLET | Freq: Three times a day (TID) | ORAL | 0 refills | Status: DC | PRN

## 2016-08-13 MED ORDER — SODIUM CHLORIDE 0.9 % IV BOLUS (SEPSIS)
1000.0000 mL | Freq: Once | INTRAVENOUS | Status: AC
  Administered 2016-08-13: 1000 mL via INTRAVENOUS

## 2016-08-13 NOTE — ED Notes (Signed)
Patient transported to CT 

## 2016-08-13 NOTE — ED Triage Notes (Signed)
Per [facility]: family reported N/V/D since 11p. Family states few moments of "unconscious with low BP and pulse".   4 zofran in route, states feeling better after last vomiting episode  Extensive heart hx

## 2016-08-13 NOTE — ED Provider Notes (Signed)
[facility] Emergency Department Provider Note   ____________________________________________   First MD Initiated Contact with Patient 08/13/16 [phone]     (approximate)  I have reviewed the triage vital signs and the nursing notes.   HISTORY  Chief Complaint Nausea; Emesis; and Diarrhea    HPI Daniel Bray is a 82 y.o. male who comes into the hospital today with some vomiting and diarrhea. The patient thinks it may be something that he ate. He reports that he's been having vomiting, diarrhea, gas and belching which woke him up out of sleep. The patient vomited 3 times and reports it was mostly nonbloody and nonbilious fluid. It was brown and he reports that he did have meatloaf for dinner. The patient reports that he's had 3 episodes of nonbloody diarrhea as well. He denies any abdominal pain. He reports that he was sitting on the toilet seat and felt he was given a pass out and then he just did. His wife did tell me he passed out he is unsure exactly how long he was for and if anything else occurred. The patient denies any chest pain, shortness of breath, dizziness prior to the syncopal event. He is here for evaluation.The patient's family reports that he became unresponsive and his heart rate blood pressure were low.   Past Medical History:  Diagnosis Date  . Cancer ([place])    kidney  . Cancer ([place])    pancreas  . Cancer ([place])    colon   . Diabetes mellitus without complication ([place])   . Hypertension   . Myocardial infarction   . Streptococcal infection    Strep Bovis  . Thyroid disease     There are no active problems to display for this patient.   Past Surgical History:  Procedure Laterality Date  . CARDIAC ELECTROPHYSIOLOGY STUDY AND ABLATION    . CARDIAC SURGERY    . CORONARY ANGIOPLASTY WITH STENT PLACEMENT    . CORONARY ARTERY BYPASS GRAFT     x3  . SPLENECTOMY      Prior to Admission medications   Medication Sig Start Date End  Date Taking? Authorizing Provider  ALPRAZolam (XANAX) 0.25 MG tablet Take 0.25 mg by mouth at bedtime.   Yes Historical Provider, MD  aspirin EC 81 MG tablet Take 81 mg by mouth daily.   Yes Historical Provider, MD  cholecalciferol (VITAMIN D) 1000 units tablet Take 1,000 Units by mouth daily.   Yes Historical Provider, MD  FLORA-Q [facility]) CAPS capsule Take 1 capsule by mouth daily.   Yes Historical Provider, MD  glipiZIDE (GLUCOTROL) 5 MG tablet Take by mouth daily before breakfast.   Yes Historical Provider, MD  levothyroxine (SYNTHROID, LEVOTHROID) 88 MCG tablet Take 88 mcg by mouth daily before breakfast.   Yes Historical Provider, MD  lisinopril (PRINIVIL,ZESTRIL) 5 MG tablet Take 5 mg by mouth 2 (two) times daily.   Yes Historical Provider, MD  omeprazole (PRILOSEC) 40 MG capsule Take 40 mg by mouth daily.   Yes Historical Provider, MD  simvastatin (ZOCOR) 40 MG tablet Take 40 mg by mouth daily.   Yes Historical Provider, MD  sulfamethoxazole-trimethoprim (BACTRIM DS,SEPTRA DS) 800-160 MG tablet Take 1 tablet by mouth once.   Yes Historical Provider, MD  tamsulosin (FLOMAX) 0.4 MG CAPS capsule Take 0.4 mg by mouth daily after supper.   Yes Historical Provider, MD  ondansetron (ZOFRAN ODT) 4 MG disintegrating tablet Take 1 tablet (4 mg total) by mouth every 8 (eight) hours  as needed for nausea or vomiting. 08/13/16   Loney Hering, MD    Allergies Levaquin [levofloxacin in d5w] and Januvia [sitagliptin]  Family History  Problem Relation Age of Onset  . Family history unknown: Yes    Social History Social History  Substance Use Topics  . Smoking status: Current Every Day Smoker    Packs/day: 1.00    Types: Cigars  . Smokeless tobacco: Current User    Types: Chew  . Alcohol use No    Review of Systems Constitutional: No fever/chills Eyes: No visual changes. ENT: No sore throat. Cardiovascular: Denies chest pain. Respiratory: Denies shortness of breath. Gastrointestinal:  Nausea and vomiting with No abdominal pain.    No diarrhea.  No constipation. Genitourinary: Negative for dysuria. Musculoskeletal: Negative for back pain. Skin: Negative for rash. Neurological: Syncope  10-point ROS otherwise negative.  ____________________________________________   PHYSICAL EXAM:  VITAL SIGNS: ED Triage Vitals  Enc Vitals Group     BP 08/13/16 0426 115/63     Pulse Rate 08/13/16 0430 60     Resp 08/13/16 0426 14     Temp 08/13/16 0426 97.4 F (36.3 C)     Temp Source 08/13/16 0426 Oral     SpO2 08/13/16 0420 98 %     Weight 08/13/16 0426 170 lb (77.1 kg)     Height 08/13/16 0426 5\' 10"  (1.778 m)     Head Circumference --      Peak Flow --      Pain Score --      Pain Loc --      Pain Edu? --      Excl. in [place]? --     Constitutional: Alert and oriented. Well appearing and in mild distress. Eyes: Conjunctivae are normal. PERRL. EOMI. Head: Atraumatic. Nose: No congestion/rhinnorhea. Mouth/Throat: Mucous membranes are moist.  Oropharynx non-erythematous. Cardiovascular: Normal rate, regular rhythm. Grossly normal heart sounds.  Good peripheral circulation. Respiratory: Normal respiratory effort.  No retractions. Lungs CTAB. Gastrointestinal: Soft and nontender. No distention. Positive bowel sounds Musculoskeletal: No lower extremity tenderness nor edema.   Neurologic:  Normal speech and language.  Skin:  Skin is warm, dry and intact. Psychiatric: Mood and affect are normal.   ____________________________________________   LABS (all labs ordered are listed, but only abnormal results are displayed)  Labs Reviewed  COMPREHENSIVE METABOLIC PANEL - Abnormal; Notable for the following:       Result Value   Glucose, Bld 188 (*)    BUN 42 (*)    Creatinine, Ser 2.48 (*)    GFR calc non Af Amer 23 (*)    GFR calc Af Amer 27 (*)    All other components within normal limits  CBC - Abnormal; Notable for the following:    WBC 22.0 (*)    Hemoglobin 11.6  (*)    HCT 37.5 (*)    MCHC 30.8 (*)    RDW 16.8 (*)    All other components within normal limits  TROPONIN I - Abnormal; Notable for the following:    Troponin I 0.03 (*)    All other components within normal limits  LIPASE, BLOOD  URINALYSIS, COMPLETE (UACMP) WITH MICROSCOPIC   ____________________________________________  EKG  none ____________________________________________  RADIOLOGY  CT head and cervical spine ____________________________________________   PROCEDURES  Procedure(s) performed: None  Procedures  Critical Care performed: No  ____________________________________________   INITIAL IMPRESSION / ASSESSMENT AND PLAN / ED COURSE  Pertinent labs & imaging results that were  available during my care of the patient were reviewed by me and considered in my medical decision making (see chart for details).  This is a 82 year old male who comes into the hospital today with vomiting and diarrhea. I will give him a liter of normal saline. As the patient passed out I will also send him for a CT scan of his head for evaluation of the trauma.  Clinical Course as of Aug 13 814  Tue Aug 13, 2016  0815 No acute intracranial abnormalities. Chronic atrophy and small vessel ischemic changes.  Nonspecific straightening of usual cervical lordosis. Diffuse degenerative changes in the cervical spine. No acute displaced fractures identified.    CT Head Wo Contrast [AW]    Clinical Course User Index [AW] Loney Hering, MD   The patient received 2 L of normal saline. The patient received some Zofran at home. He does have an elevated white blood cell count but he is adamant that he does not have any abdominal pain. He did state he had some aching to his left upper quadrant but it was not very severe or significant. I did give the patient a GI cocktail and he was able to drink some water without vomiting. The remainder of the patient's blood work is unremarkable. He  has some mild increase in his creatinine but it is not dull. After his fluids the patient reports he feels much better and would like to go home. The patient be discharged to home. I encouraged him to follow back up with his doctor and to return here with any worsening symptoms over the next couple of days.  ____________________________________________   FINAL CLINICAL IMPRESSION(S) / ED DIAGNOSES  Final diagnoses:  Nausea vomiting and diarrhea  Vasovagal syncope  Dehydration      NEW MEDICATIONS STARTED DURING THIS VISIT:  New Prescriptions   ONDANSETRON (ZOFRAN ODT) 4 MG DISINTEGRATING TABLET    Take 1 tablet (4 mg total) by mouth every 8 (eight) hours as needed for nausea or vomiting.     Note:  This document was prepared using Dragon voice recognition software and may include unintentional dictation errors.    Loney Hering, MD 08/13/16 [phone]

## 2016-09-13 DIAGNOSIS — D500 Iron deficiency anemia secondary to blood loss (chronic): Secondary | ICD-10-CM | POA: Insufficient documentation

## 2016-09-15 DIAGNOSIS — M7611 Psoas tendinitis, right hip: Secondary | ICD-10-CM | POA: Insufficient documentation

## 2016-10-03 ENCOUNTER — Emergency Department
Admission: EM | Admit: 2016-10-03 | Discharge: 2016-10-04 | Disposition: A | Discharge status: Other Acute Inpatient Hospital | Payer: Medicare HMO | Attending: Emergency Medicine | Admitting: Emergency Medicine

## 2016-10-03 ENCOUNTER — Emergency Department: Payer: Medicare HMO

## 2016-10-03 DIAGNOSIS — Z7984 Long term (current) use of oral hypoglycemic drugs: Secondary | ICD-10-CM | POA: Diagnosis not present

## 2016-10-03 DIAGNOSIS — Z8507 Personal history of malignant neoplasm of pancreas: Secondary | ICD-10-CM | POA: Diagnosis not present

## 2016-10-03 DIAGNOSIS — R55 Syncope and collapse: Secondary | ICD-10-CM

## 2016-10-03 DIAGNOSIS — Z951 Presence of aortocoronary bypass graft: Secondary | ICD-10-CM | POA: Insufficient documentation

## 2016-10-03 DIAGNOSIS — E119 Type 2 diabetes mellitus without complications: Secondary | ICD-10-CM | POA: Insufficient documentation

## 2016-10-03 DIAGNOSIS — Z7982 Long term (current) use of aspirin: Secondary | ICD-10-CM | POA: Diagnosis not present

## 2016-10-03 DIAGNOSIS — F17220 Nicotine dependence, chewing tobacco, uncomplicated: Secondary | ICD-10-CM | POA: Insufficient documentation

## 2016-10-03 DIAGNOSIS — Z85038 Personal history of other malignant neoplasm of large intestine: Secondary | ICD-10-CM | POA: Diagnosis not present

## 2016-10-03 DIAGNOSIS — Z85528 Personal history of other malignant neoplasm of kidney: Secondary | ICD-10-CM | POA: Insufficient documentation

## 2016-10-03 DIAGNOSIS — F17290 Nicotine dependence, other tobacco product, uncomplicated: Secondary | ICD-10-CM | POA: Diagnosis not present

## 2016-10-03 DIAGNOSIS — I10 Essential (primary) hypertension: Secondary | ICD-10-CM | POA: Diagnosis not present

## 2016-10-03 DIAGNOSIS — Z79899 Other long term (current) drug therapy: Secondary | ICD-10-CM | POA: Diagnosis not present

## 2016-10-03 DIAGNOSIS — D62 Acute posthemorrhagic anemia: Secondary | ICD-10-CM | POA: Diagnosis not present

## 2016-10-03 LAB — CBC WITH DIFFERENTIAL/PLATELET
BAND NEUTROPHILS: 0 %
BASOS PCT: 0 %
BLASTS: 0 %
Basophils Absolute: 0 10*3/uL (ref 0–0.1)
EOS ABS: 0 10*3/uL (ref 0–0.7)
EOS PCT: 0 %
HCT: 25.3 % — ABNORMAL LOW (ref 40.0–52.0)
HEMOGLOBIN: 7.8 g/dL — AB (ref 13.0–18.0)
LYMPHS ABS: 11.3 10*3/uL — AB (ref 1.0–3.6)
LYMPHS PCT: 51 %
MCH: 26.6 pg (ref 26.0–34.0)
MCHC: 30.7 g/dL — AB (ref 32.0–36.0)
MCV: 86.7 fL (ref 80.0–100.0)
MONO ABS: 1.8 10*3/uL — AB (ref 0.2–1.0)
MONOS PCT: 8 %
Metamyelocytes Relative: 0 %
Myelocytes: 0 %
NEUTROS ABS: 9.1 10*3/uL — AB (ref 1.4–6.5)
Neutrophils Relative %: 41 %
OTHER: 0 %
Platelets: 259 10*3/uL (ref 150–440)
Promyelocytes Absolute: 0 %
RBC: 2.92 MIL/uL — ABNORMAL LOW (ref 4.40–5.90)
RDW: 18.5 % — ABNORMAL HIGH (ref 11.5–14.5)
WBC: 22.2 10*3/uL — ABNORMAL HIGH (ref 3.8–10.6)
nRBC: 0 /100 WBC

## 2016-10-03 LAB — COMPREHENSIVE METABOLIC PANEL
ALK PHOS: 40 U/L (ref 38–126)
ALT: 12 U/L — ABNORMAL LOW (ref 17–63)
ANION GAP: 6 (ref 5–15)
AST: 22 U/L (ref 15–41)
Albumin: 3.4 g/dL — ABNORMAL LOW (ref 3.5–5.0)
BUN: 34 mg/dL — ABNORMAL HIGH (ref 6–20)
CALCIUM: 9 mg/dL (ref 8.9–10.3)
CO2: 26 mmol/L (ref 22–32)
Chloride: 105 mmol/L (ref 101–111)
Creatinine, Ser: 1.96 mg/dL — ABNORMAL HIGH (ref 0.61–1.24)
GFR calc non Af Amer: 31 mL/min — ABNORMAL LOW (ref >60)
GFR, EST AFRICAN AMERICAN: 36 mL/min — AB (ref >60)
Glucose, Bld: 209 mg/dL — ABNORMAL HIGH (ref 65–99)
Potassium: 4.3 mmol/L (ref 3.5–5.1)
SODIUM: 137 mmol/L (ref 135–145)
TOTAL PROTEIN: 5.5 g/dL — AB (ref 6.5–8.1)
Total Bilirubin: 0.7 mg/dL (ref 0.3–1.2)

## 2016-10-03 LAB — TROPONIN I: TROPONIN I: 0.05 ng/mL — AB (ref <0.03)

## 2016-10-03 LAB — PREPARE RBC (CROSSMATCH)

## 2016-10-03 LAB — ABO/RH: ABO/RH(D): A POS

## 2016-10-03 MED ORDER — SODIUM CHLORIDE 0.9 % IV SOLN
1000.0000 mL | Freq: Once | INTRAVENOUS | Status: AC
  Administered 2016-10-03: 1000 mL via INTRAVENOUS

## 2016-10-03 MED ORDER — SODIUM CHLORIDE 0.9 % IV SOLN
10.0000 mL/h | Freq: Once | INTRAVENOUS | Status: AC
Start: 2016-10-03 — End: 2016-10-04
  Administered 2016-10-03: 10 mL/h via INTRAVENOUS

## 2016-10-03 NOTE — ED Notes (Signed)
Patient transported to CT at this time. 

## 2016-10-03 NOTE — ED Triage Notes (Signed)
Pt arrives to ED via [facility] from home d/t syncopal episodes following RIGHT hip surgery this morning. EMS reports pt had procedure done to remove scar tissue from his hip. EMS reports pt was clammy with altered alertness and pinpoint pupils upon their arrival to pt's residence; pt denies any use of painkillers besides what was given during the procedure. EMS reports giving 2mg  Narcan intranasal and 1mg  PIV without noticeable improvement. Pt states the last time he was given Fentanyl it took him "5-6 weeks" to fully recover to his baseline mental status. Dr Jimmye Norman at bedside upon pt's arrival to ED.

## 2016-10-03 NOTE — ED Provider Notes (Addendum)
[facility] Emergency Department Provider Note        Time seen: ----------------------------------------- 7:56 PM on 10/03/2016 -----------------------------------------    I have reviewed the triage vital signs and the nursing notes.   HISTORY  Chief Complaint Loss of Consciousness    HPI Daniel Bray is a 82 y.o. male who presents to ER after having had a syncopal event. Patient has syncopal event at home. Patient reports not feeling well, he had right hip surgery this morning for right hip bursitis and to remove scar tissue EMS reports patient was clammy with altered alertness and pinpoint pupils upon their arrival. Patient denies any use of the painkillers besides what he was given during the procedure. He was given Narcan without significant improvement prehospital. Patient notes he was given fentanyl in the past and it took him 5-6 weeks to fully recover after that episode. He denies syncope in the past.   Past Medical History:  Diagnosis Date  . Cancer ([place])    kidney  . Cancer ([place])    pancreas  . Cancer ([place])    colon   . Diabetes mellitus without complication ([place])   . Hypertension   . Myocardial infarction   . Streptococcal infection    Strep Bovis  . Thyroid disease     There are no active problems to display for this patient.   Past Surgical History:  Procedure Laterality Date  . CARDIAC ELECTROPHYSIOLOGY STUDY AND ABLATION    . CARDIAC SURGERY    . CORONARY ANGIOPLASTY WITH STENT PLACEMENT    . CORONARY ARTERY BYPASS GRAFT     x3  . SPLENECTOMY      Allergies Levaquin [levofloxacin in d5w] and Januvia [sitagliptin]  Social History Social History  Substance Use Topics  . Smoking status: Current Every Day Smoker    Packs/day: 1.00    Types: Cigars  . Smokeless tobacco: Current User    Types: Chew  . Alcohol use No    Review of Systems Constitutional: Negative for fever. Cardiovascular: Negative for chest  pain. Respiratory: Negative for shortness of breath. Gastrointestinal: Negative for abdominal pain, vomiting and diarrhea. Genitourinary: Negative for dysuria. Musculoskeletal: Positive for right hip pain Skin: Positive for diaphoresis Neurological: Negative for headaches, focal weakness or numbness.  10-point ROS otherwise negative.  ____________________________________________   PHYSICAL EXAM:  VITAL SIGNS: ED Triage Vitals  Enc Vitals Group     BP 10/03/16 1952 (!) 178/96     Pulse Rate 10/03/16 1952 61     Resp 10/03/16 1952 18     Temp --      Temp src --      SpO2 10/03/16 1945 97 %     Weight 10/03/16 1952 174 lb (78.9 kg)     Height 10/03/16 1952 5\' 10"  (1.778 m)     Head Circumference --      Peak Flow --      Pain Score 10/03/16 1952 0     Pain Loc --      Pain Edu? --      Excl. in [place]? --     Constitutional: Alert and oriented. Lethargy, mild distress Eyes: Conjunctivae are normal. PERRL. Normal extraocular movements. ENT   Head: Normocephalic and atraumatic.   Nose: No congestion/rhinnorhea.   Mouth/Throat: Mucous membranes are moist.   Neck: No stridor. Cardiovascular: Normal rate, regular rhythm. No murmurs, rubs, or gallops. Respiratory: Normal respiratory effort without tachypnea nor retractions. Breath sounds are clear and equal  bilaterally. No wheezes/rales/rhonchi. Gastrointestinal: Soft and nontender. Normal bowel sounds Musculoskeletal:  Mild pain with range of motion of the right hip, wound dressing is clean, no bleeding. Large right hip effusion Neurologic:  Normal speech and language. No gross focal neurologic deficits are appreciated.  Generalized weakness, nothing focal Skin:  Skin is warm, dry  with no rash noted ____________________________________________  EKG: Interpreted by me.Sinus rhythm rate of 56 bpm, prolonged PR interval, normal QT, possible septal infarct age indeterminate  Repeat EKG interpreted by me, sinus rhythm  rate of 61 bpm, prolonged PR interval, possible septal infarct, normal QT, normal axis. ____________________________________________  ED COURSE:  Pertinent labs & imaging results that were available during my care of the patient were reviewed by me and considered in my medical decision making (see chart for details).  Patient presents to ER for syncope and weakness. Symptoms are indeterminate in etiology. We will assess with labs and possible imaging.   Procedures ____________________________________________   LABS (pertinent positives/negatives)  Labs Reviewed  CBC WITH DIFFERENTIAL/PLATELET - Abnormal; Notable for the following:       Result Value   WBC 22.2 (*)    RBC 2.92 (*)    Hemoglobin 7.8 (*)    HCT 25.3 (*)    MCHC 30.7 (*)    RDW 18.5 (*)    Neutro Abs 9.1 (*)    Lymphs Abs 11.3 (*)    Monocytes Absolute 1.8 (*)    All other components within normal limits  COMPREHENSIVE METABOLIC PANEL - Abnormal; Notable for the following:    Glucose, Bld 209 (*)    BUN 34 (*)    Creatinine, Ser 1.96 (*)    Total Protein 5.5 (*)    Albumin 3.4 (*)    ALT 12 (*)    GFR calc non Af Amer 31 (*)    GFR calc Af Amer 36 (*)    All other components within normal limits  TROPONIN I - Abnormal; Notable for the following:    Troponin I 0.05 (*)    All other components within normal limits  URINALYSIS, COMPLETE (UACMP) WITH MICROSCOPIC  TROPONIN I  PREPARE RBC (CROSSMATCH)  TYPE AND SCREEN  ABO/RH    RADIOLOGY Images were viewed by me  CT right hip IMPRESSION: Postoperative change involving the subcutaneous soft tissues overlying the right hip and underlying gluteal muscles status recent right hip surgery. Edema and fluid tracks along the quadriceps muscles. No evidence of active hemorrhage given the limitations due to streak artifacts from the patient's right hip arthroplasty. No evidence of fracture or bone destruction. No  malalignment.  ____________________________________________  FINAL ASSESSMENT AND PLAN  Syncope, anemia, right hip effusion, elevated troponin  Plan: Patient with labs and imaging as dictated above. Patient presented to the ER after a syncopal event. Rectally he was heme negative for blood. No other explanation for blood loss other than due to his operation today. I have ordered a unit of blood for him. He needs to be monitored and have his troponin rechecked. I did discuss with the surgeon has agreed to take him back in transfer to [facility].   Earleen Newport, MD   Note: This note was generated in part or whole with voice recognition software. Voice recognition is usually quite accurate but there are transcription errors that can and very often do occur. I apologize for any typographical errors that were not detected and corrected.     Earleen Newport, MD 10/03/16 2136  Earleen Newport, MD 10/03/16 2200

## 2016-10-04 LAB — URINALYSIS, COMPLETE (UACMP) WITH MICROSCOPIC
BILIRUBIN URINE: NEGATIVE
Bacteria, UA: NONE SEEN
Glucose, UA: NEGATIVE mg/dL
Hgb urine dipstick: NEGATIVE
KETONES UR: NEGATIVE mg/dL
Leukocytes, UA: NEGATIVE
Nitrite: NEGATIVE
Protein, ur: NEGATIVE mg/dL
RBC / HPF: NONE SEEN RBC/hpf (ref 0–5)
SQUAMOUS EPITHELIAL / LPF: NONE SEEN
Specific Gravity, Urine: 1.013 (ref 1.005–1.030)
pH: 6 (ref 5.0–8.0)

## 2016-10-04 LAB — BPAM RBC
BLOOD PRODUCT EXPIRATION DATE: 201803072359
ISSUE DATE / TIME: 201803012201
UNIT TYPE AND RH: 9500

## 2016-10-04 LAB — TYPE AND SCREEN
ABO/RH(D): A POS
Antibody Screen: NEGATIVE
Unit division: 0

## 2016-11-15 DIAGNOSIS — Z9889 Other specified postprocedural states: Secondary | ICD-10-CM | POA: Insufficient documentation

## 2017-12-05 DIAGNOSIS — F458 Other somatoform disorders: Secondary | ICD-10-CM | POA: Insufficient documentation

## 2018-04-23 ENCOUNTER — Emergency Department
Admission: EM | Admit: 2018-04-23 | Discharge: 2018-04-23 | Disposition: A | Discharge status: Home / Self Care | Payer: Medicare HMO | Attending: Emergency Medicine | Admitting: Emergency Medicine

## 2018-04-23 ENCOUNTER — Other Ambulatory Visit: Payer: Self-pay

## 2018-04-23 DIAGNOSIS — Z7984 Long term (current) use of oral hypoglycemic drugs: Secondary | ICD-10-CM | POA: Diagnosis not present

## 2018-04-23 DIAGNOSIS — I10 Essential (primary) hypertension: Secondary | ICD-10-CM | POA: Diagnosis not present

## 2018-04-23 DIAGNOSIS — Z79899 Other long term (current) drug therapy: Secondary | ICD-10-CM | POA: Diagnosis not present

## 2018-04-23 DIAGNOSIS — R197 Diarrhea, unspecified: Secondary | ICD-10-CM | POA: Insufficient documentation

## 2018-04-23 DIAGNOSIS — F17210 Nicotine dependence, cigarettes, uncomplicated: Secondary | ICD-10-CM | POA: Insufficient documentation

## 2018-04-23 DIAGNOSIS — E119 Type 2 diabetes mellitus without complications: Secondary | ICD-10-CM | POA: Insufficient documentation

## 2018-04-23 LAB — COMPREHENSIVE METABOLIC PANEL
ALBUMIN: 4.3 g/dL (ref 3.5–5.0)
ALK PHOS: 62 U/L (ref 38–126)
ALT: 26 U/L (ref 0–44)
ANION GAP: 6 (ref 5–15)
AST: 21 U/L (ref 15–41)
BUN: 47 mg/dL — ABNORMAL HIGH (ref 8–23)
CO2: 20 mmol/L — AB (ref 22–32)
Calcium: 10.5 mg/dL — ABNORMAL HIGH (ref 8.9–10.3)
Chloride: 109 mmol/L (ref 98–111)
Creatinine, Ser: 2 mg/dL — ABNORMAL HIGH (ref 0.61–1.24)
GFR calc Af Amer: 35 mL/min — ABNORMAL LOW (ref >60)
GFR calc non Af Amer: 30 mL/min — ABNORMAL LOW (ref >60)
GLUCOSE: 130 mg/dL — AB (ref 70–99)
POTASSIUM: 4.8 mmol/L (ref 3.5–5.1)
SODIUM: 135 mmol/L (ref 135–145)
Total Bilirubin: 0.7 mg/dL (ref 0.3–1.2)
Total Protein: 7.5 g/dL (ref 6.5–8.1)

## 2018-04-23 LAB — C DIFFICILE QUICK SCREEN W PCR REFLEX
C DIFFICILE (CDIFF) TOXIN: NEGATIVE
C Diff antigen: NEGATIVE
C Diff interpretation: NOT DETECTED

## 2018-04-23 LAB — CBC
HEMATOCRIT: 45.2 % (ref 40.0–52.0)
HEMOGLOBIN: 15.2 g/dL (ref 13.0–18.0)
MCH: 35 pg — AB (ref 26.0–34.0)
MCHC: 33.7 g/dL (ref 32.0–36.0)
MCV: 103.6 fL — ABNORMAL HIGH (ref 80.0–100.0)
Platelets: 211 10*3/uL (ref 150–440)
RBC: 4.36 MIL/uL — ABNORMAL LOW (ref 4.40–5.90)
RDW: 13.3 % (ref 11.5–14.5)
WBC: 16.2 10*3/uL — ABNORMAL HIGH (ref 3.8–10.6)

## 2018-04-23 LAB — URINALYSIS, COMPLETE (UACMP) WITH MICROSCOPIC
BILIRUBIN URINE: NEGATIVE
Bacteria, UA: NONE SEEN
GLUCOSE, UA: 150 mg/dL — AB
Hgb urine dipstick: NEGATIVE
Ketones, ur: NEGATIVE mg/dL
Leukocytes, UA: NEGATIVE
Nitrite: NEGATIVE
PH: 5 (ref 5.0–8.0)
Protein, ur: NEGATIVE mg/dL
SPECIFIC GRAVITY, URINE: 1.02 (ref 1.005–1.030)

## 2018-04-23 LAB — GASTROINTESTINAL PANEL BY PCR, STOOL (REPLACES STOOL CULTURE)

## 2018-04-23 LAB — LIPASE, BLOOD: LIPASE: 25 U/L (ref 11–51)

## 2018-04-23 MED ORDER — SODIUM CHLORIDE 0.9 % IV SOLN
Freq: Once | INTRAVENOUS | Status: AC
Start: 2018-04-23 — End: 2018-04-23
  Administered 2018-04-23: 15:00:00 via INTRAVENOUS

## 2018-04-23 MED ORDER — DIPHENOXYLATE-ATROPINE 2.5-0.025 MG PO TABS: 1.0000 | ORAL_TABLET | Freq: Four times a day (QID) | ORAL | 0 refills | Status: AC | PRN

## 2018-04-23 NOTE — ED Triage Notes (Signed)
Pt c/o watery diarrhea since Sunday night, went to the urgent care on Monday and was given 2 bags of fluid. States he went back today because it was not getting any better and was referred to the ED for eval.

## 2018-04-23 NOTE — ED Provider Notes (Signed)
[facility] Emergency Department Provider Note   ____________________________________________   I have reviewed the triage vital signs and the nursing notes.   HISTORY  Chief Complaint Diarrhea   History limited by: Not Limited   HPI Daniel Bray is a 83 y.o. male who presents to the emergency department today because of concerns for profuse diarrhea.  He states it started 3 days ago.  He had multiple episodes of watery diarrhea.  Went to urgent care and received IV fluids.  He did feel somewhat better however the diarrhea returned.  He has been trying to keep up with his hydration.  He denies similar symptoms in the past.  Denies unusual ingestions.  Denies any recent travel.  He does however state that he was treated with Augmentin last month for small intestine bacterial overgrowth.  He denies any fevers or abdominal pain.   Per medical record review patient has a history of pancreatic cancer s/p resection and colectomy.  Past Medical History:  Diagnosis Date  . Cancer ([place])    kidney  . Cancer ([place])    pancreas  . Cancer ([place])    colon   . Diabetes mellitus without complication ([place])   . Hypertension   . Myocardial infarction ([place])   . Streptococcal infection    Strep Bovis  . Thyroid disease     There are no active problems to display for this patient.   Past Surgical History:  Procedure Laterality Date  . CARDIAC ELECTROPHYSIOLOGY STUDY AND ABLATION    . CARDIAC SURGERY    . CORONARY ANGIOPLASTY WITH STENT PLACEMENT    . CORONARY ARTERY BYPASS GRAFT     x3  . SPLENECTOMY      Prior to Admission medications   Medication Sig Start Date End Date Taking? Authorizing Provider  ALPRAZolam (XANAX) 0.25 MG tablet Take 0.25 mg by mouth at bedtime.    [provider]  aspirin EC 81 MG tablet Take 81 mg by mouth daily.    [provider]  cholecalciferol (VITAMIN D) 1000 units tablet Take 1,000 Units by mouth daily.     [provider]  FLORA-Q [facility]) CAPS capsule Take 1 capsule by mouth daily.    [provider]  glipiZIDE (GLUCOTROL) 5 MG tablet Take by mouth daily before breakfast.    [provider]  levothyroxine (SYNTHROID, LEVOTHROID) 88 MCG tablet Take 88 mcg by mouth daily before breakfast.    [provider]  lisinopril (PRINIVIL,ZESTRIL) 5 MG tablet Take 5 mg by mouth 2 (two) times daily.    [provider]  omeprazole (PRILOSEC) 40 MG capsule Take 40 mg by mouth daily.    [provider]  ondansetron (ZOFRAN ODT) 4 MG disintegrating tablet Take 1 tablet (4 mg total) by mouth every 8 (eight) hours as needed for nausea or vomiting. 08/13/16   Loney Hering, MD  simvastatin (ZOCOR) 40 MG tablet Take 40 mg by mouth daily.    [provider]  sulfamethoxazole-trimethoprim (BACTRIM DS,SEPTRA DS) 800-160 MG tablet Take 1 tablet by mouth once.    [provider]  tamsulosin (FLOMAX) 0.4 MG CAPS capsule Take 0.4 mg by mouth daily after supper.    [provider]    Allergies Levaquin [levofloxacin in d5w] and Januvia [sitagliptin]  Family History  Family history unknown: Yes    Social History Social History   Tobacco Use  . Smoking status: Current Every Day Smoker    Packs/day: 1.00  Types: Cigars  . Smokeless tobacco: Current User    Types: Chew  Substance Use Topics  . Alcohol use: No  . Drug use: No    Review of Systems Constitutional: No fever/chills Eyes: No visual changes. ENT: No sore throat. Cardiovascular: Denies chest pain. Respiratory: Denies shortness of breath. Gastrointestinal: No abdominal pain. Positive for diarrhea. Genitourinary: Negative for dysuria. Musculoskeletal: Negative for back pain. Skin: Negative for rash. Neurological: Negative for headaches, focal weakness or numbness.  ____________________________________________   PHYSICAL EXAM:  VITAL SIGNS: ED Triage Vitals   Enc Vitals Group     BP 04/23/18 1254 107/62     Pulse Rate 04/23/18 1254 68     Resp 04/23/18 1254 17     Temp --      Temp Source 04/23/18 1254 Oral     SpO2 04/23/18 1254 98 %     Weight 04/23/18 1255 160 lb (72.6 kg)     Height 04/23/18 1255 5\' 10"  (1.778 m)     Head Circumference --      Peak Flow --      Pain Score 04/23/18 1255 0   Constitutional: Alert and oriented.  Eyes: Conjunctivae are normal.  ENT      Head: Normocephalic and atraumatic.      Nose: No congestion/rhinnorhea.      Mouth/Throat: Mucous membranes are moist.      Neck: No stridor. Hematological/Lymphatic/Immunilogical: No cervical lymphadenopathy. Cardiovascular: Normal rate, regular rhythm.  No murmurs, rubs, or gallops.  Respiratory: Normal respiratory effort without tachypnea nor retractions. Breath sounds are clear and equal bilaterally. No wheezes/rales/rhonchi. Gastrointestinal: Soft and non tender. No rebound. No guarding.  Genitourinary: Deferred Musculoskeletal: Normal range of motion in all extremities. No lower extremity edema. Neurologic:  Normal speech and language. No gross focal neurologic deficits are appreciated.  Skin:  Skin is warm, dry and intact. No rash noted. Psychiatric: Mood and affect are normal. Speech and behavior are normal. Patient exhibits appropriate insight and judgment.  ____________________________________________    LABS (pertinent positives/negatives)  Lipase 25 CBC wbc 16.2, hgb 15.2, plt 211 CMP na 135, k 4.8, glu 130, cr 2.00 UA clear, glu 150 otherwise unremarkable C.dif and GI panel pending ____________________________________________   EKG  None  ____________________________________________    RADIOLOGY  None  ____________________________________________   PROCEDURES  Procedures  ____________________________________________   INITIAL IMPRESSION / ASSESSMENT AND PLAN / ED COURSE  Pertinent labs & imaging results that were available  during my care of the patient were reviewed by me and considered in my medical decision making (see chart for details).   Patient presents to the emergency department today because of concerns for watery diarrhea.  The patient did have a recent antibiotic use.  Patient with a mild leukocytosis.  Do have concerns for C. difficile.  Will start IV fluids and check for C. difficile as well as GI panel.   ____________________________________________   FINAL CLINICAL IMPRESSION(S) / ED DIAGNOSES  Diarrhea  Note: This dictation was prepared with Dragon dictation. Any transcriptional errors that result from this process are unintentional     Nance Pear, MD 04/23/18 1440

## 2018-04-23 NOTE — ED Provider Notes (Signed)
Signout from Dr. Archie Balboa in this 83 year old male with recent antibiotic use and diarrhea.  Pending stool studies at this time.  Physical Exam  BP 122/74   Pulse (!) 55   Resp 16   Ht 5\' 10"  (1.778 m)   Wt 72.6 kg   SpO2 (!) 88%   BMI 22.96 kg/m  ----------------------------------------- 6:30 PM on 04/23/2018 -----------------------------------------   Physical Exam Patient at this time tolerating food and drink.  Abdomen is soft and nontender throughout. ED Course/Procedures     Procedures  MDM  Patient with negative C. difficile as well as stool PCR panel.  To be discharged with Lomotil.  Patient advised to stay well-hydrated.  We will follow-up as an outpatient.       Daniel Pyo, MD 04/23/18 [phone]

## 2018-04-23 NOTE — ED Notes (Signed)
Pt placed in enteric precautions.

## 2018-06-08 ENCOUNTER — Other Ambulatory Visit: Payer: Self-pay | Admitting: Physician Assistant

## 2018-06-08 DIAGNOSIS — M5441 Lumbago with sciatica, right side: Principal | ICD-10-CM

## 2018-06-08 DIAGNOSIS — G8929 Other chronic pain: Secondary | ICD-10-CM

## 2018-06-24 ENCOUNTER — Ambulatory Visit
Admission: RE | Admit: 2018-06-24 | Discharge: 2018-06-24 | Disposition: A | Discharge status: Home / Self Care | Payer: Medicare HMO | Source: Ambulatory Visit | Attending: Physician Assistant | Admitting: Physician Assistant

## 2018-06-24 DIAGNOSIS — G8929 Other chronic pain: Secondary | ICD-10-CM | POA: Diagnosis present

## 2018-06-24 DIAGNOSIS — M5137 Other intervertebral disc degeneration, lumbosacral region: Secondary | ICD-10-CM | POA: Insufficient documentation

## 2018-06-24 DIAGNOSIS — M25561 Pain in right knee: Secondary | ICD-10-CM | POA: Insufficient documentation

## 2018-06-24 DIAGNOSIS — M5441 Lumbago with sciatica, right side: Secondary | ICD-10-CM | POA: Insufficient documentation

## 2018-06-24 DIAGNOSIS — M5136 Other intervertebral disc degeneration, lumbar region: Secondary | ICD-10-CM | POA: Diagnosis not present

## 2018-06-24 DIAGNOSIS — M48061 Spinal stenosis, lumbar region without neurogenic claudication: Secondary | ICD-10-CM | POA: Diagnosis not present

## 2018-06-24 DIAGNOSIS — M5126 Other intervertebral disc displacement, lumbar region: Secondary | ICD-10-CM | POA: Insufficient documentation

## 2019-07-18 ENCOUNTER — Ambulatory Visit
Admission: EM | Admit: 2019-07-18 | Discharge: 2019-07-18 | Disposition: A | Discharge status: Home / Self Care | Payer: Medicare HMO | Attending: Family Medicine | Admitting: Family Medicine

## 2019-07-18 ENCOUNTER — Other Ambulatory Visit: Payer: Self-pay

## 2019-07-18 ENCOUNTER — Encounter: Payer: Self-pay | Admitting: Emergency Medicine

## 2019-07-18 DIAGNOSIS — R5383 Other fatigue: Secondary | ICD-10-CM | POA: Diagnosis not present

## 2019-07-18 DIAGNOSIS — R519 Headache, unspecified: Secondary | ICD-10-CM | POA: Diagnosis not present

## 2019-07-18 DIAGNOSIS — Z20828 Contact with and (suspected) exposure to other viral communicable diseases: Secondary | ICD-10-CM | POA: Diagnosis not present

## 2019-07-18 DIAGNOSIS — Z20822 Contact with and (suspected) exposure to covid-19: Secondary | ICD-10-CM

## 2019-07-18 NOTE — Discharge Instructions (Signed)
It was very nice seeing you today in clinic. Thank you for entrusting me with your care.   Rest and make sure you are staying hydrated. May use Tylenol and/or Ibuprofen as needed for pain/fever.   You were tested for SARS-CoV-2 (novel coronavirus) today. Testing is performed by an outside lab ([facility]) and has variable turn around times ranging between 2-5 days. Current recommendations from the the CDC and [place] DHHS require that you remain out of work in order to quarantine at home until negative test results are have been received. In the event that your test results are positive, you will be contacted with further directives. These measures are being implemented out of an abundance of caution to prevent transmission and spread during the current SARS-CoV-2 pandemic.  Make arrangements to follow up with your regular doctor in 1 week for re-evaluation if not improving.  If your symptoms/condition worsens, please seek follow up care either here or in the ER. Please remember, our [facility] providers are "right here with you" when you need [place].   Again, it was my pleasure to take care of you today. Thank you for choosing our clinic. I hope that you start to feel better quickly.   Honor Loh, MSN, APRN, FNP-C, CEN Advanced Practice Provider [facility]

## 2019-07-18 NOTE — ED Triage Notes (Signed)
Patient c/o nasal congestion, fatigue and headaches for the past 3 days.  Patient denies fevers.  Patient states that his wife and daughter have tested positive for COVID.

## 2019-07-19 LAB — NOVEL CORONAVIRUS, NAA (HOSP ORDER, SEND-OUT TO REF LAB; TAT 18-24 HRS): SARS-CoV-2, NAA: NOT DETECTED

## 2019-07-19 NOTE — ED Provider Notes (Signed)
[place], [place]   Name: Daniel Bray DOB: [birth year] MRN: [id number] CSN: [id number] PCP: Hortencia Pilar, MD  Arrival date and time:  07/18/19 1329  Chief Complaint:  Headache, Nasal Congestion, and Fatigue   NOTE: Prior to seeing the patient today, I have reviewed the triage nursing documentation and vital signs. Clinical staff has updated patient's PMH/PSHx, current medication list, and drug allergies/intolerances to ensure comprehensive history available to assist in medical decision making.   History:   HPI: Daniel Bray is a 84 y.o. male who presents today with complaints of congestion, generalized headache, and fatigue that started approximately 3 - 5 days ago. Patient denies fevers. Cough is non-productive and reported to be mild. He denies any shortness of breath or wheezing. He has not had any sore throat, facial pain, or otalgia. He does not have a PMH significant for seasonal allergies. He denies that he has experienced any nausea, vomiting, diarrhea, or abdominal pain. He is eating and drinking well. Patient denies any perceived alterations to his sense of taste or smell. Patient presents today with concerns for his personal health as his wife and daughter both tested positive for SARS-CoV-2 (novel coronavirus) yesterday; has daily contact. He has never been tested for SARS-CoV-2 (novel coronavirus) in the past per his report. Patient has been vaccinated for influenza this season. Despite his symptoms, patient has not taken any over the counter interventions to help improve/relieve his reported symptoms at home.    Past Medical History:  Diagnosis Date  . Cancer ([place])    kidney  . Cancer ([place])    pancreas  . Cancer ([place])    colon   . Diabetes mellitus without complication ([place])   . Hypertension   . Myocardial infarction ([place])   . Streptococcal infection    Strep Bovis  . Thyroid disease     Past Surgical History:  Procedure Laterality Date  . CARDIAC  ELECTROPHYSIOLOGY STUDY AND ABLATION    . CARDIAC SURGERY    . CORONARY ANGIOPLASTY WITH STENT PLACEMENT    . CORONARY ARTERY BYPASS GRAFT     x3  . SPLENECTOMY      Family History  Family history unknown: Yes    Social History   Tobacco Use  . Smoking status: Current Every Day Smoker    Packs/day: 1.00    Types: Cigars  . Smokeless tobacco: Current User    Types: Chew  Substance Use Topics  . Alcohol use: No  . Drug use: No    There are no problems to display for this patient.   Home Medications:    Current Meds  Medication Sig  . aspirin EC 81 MG tablet Take 81 mg by mouth daily.  . cholecalciferol (VITAMIN D) 1000 units tablet Take 1,000 Units by mouth daily.  Marland Kitchen glipiZIDE (GLUCOTROL) 5 MG tablet Take by mouth daily before breakfast.  . levothyroxine (SYNTHROID, LEVOTHROID) 88 MCG tablet Take 88 mcg by mouth daily before breakfast.  . lisinopril (PRINIVIL,ZESTRIL) 5 MG tablet Take 5 mg by mouth 2 (two) times daily.  Marland Kitchen omeprazole (PRILOSEC) 40 MG capsule Take 40 mg by mouth daily.  . simvastatin (ZOCOR) 40 MG tablet Take 40 mg by mouth daily.  . tamsulosin (FLOMAX) 0.4 MG CAPS capsule Take 0.4 mg by mouth daily after supper.    Allergies:   Levaquin [levofloxacin in d5w] and Januvia [sitagliptin]  Review of Systems (ROS): Review of Systems  Constitutional: Positive for fatigue. Negative for fever.  HENT: Positive for  congestion. Negative for ear pain, postnasal drip, rhinorrhea, sinus pressure, sinus pain, sneezing and sore throat.   Eyes: Negative for pain, discharge and redness.  Respiratory: Negative for cough, chest tightness and shortness of breath.   Cardiovascular: Negative for chest pain and palpitations.  Gastrointestinal: Negative for abdominal pain, diarrhea, nausea and vomiting.  Musculoskeletal: Negative for arthralgias, back pain, myalgias and neck pain.  Skin: Negative for color change, pallor and rash.  Neurological: Positive for headaches.  Negative for dizziness, syncope and weakness.  Hematological: Negative for adenopathy.     Vital Signs: Today's Vitals   07/18/19 1343 07/18/19 1347 07/18/19 1404  BP:  (!) 151/80   Pulse:  73   Resp:  16   Temp:  98.4 F (36.9 C)   TempSrc:  Oral   SpO2:  99%   Weight: 162 lb (73.5 kg)    Height: 5' 10.5" (1.791 m)    PainSc: 0-No pain  0-No pain    Physical Exam: Physical Exam  Constitutional: He is oriented to person, place, and time and well-developed, well-nourished, and in no distress.  HENT:  Head: Normocephalic and atraumatic.  Nose: Mucosal edema (minor) and rhinorrhea present. No sinus tenderness.  Mouth/Throat: Uvula is midline and mucous membranes are normal. Posterior oropharyngeal erythema (mild with (+) clear PND) present. No oropharyngeal exudate or posterior oropharyngeal edema.  Eyes: Pupils are equal, round, and reactive to light.  Cardiovascular: Normal rate, regular rhythm, normal heart sounds and intact distal pulses.  Pulmonary/Chest: Effort normal and breath sounds normal.  Musculoskeletal:     Cervical back: Normal range of motion and neck supple.  Neurological: He is alert and oriented to person, place, and time. Gait normal.  Skin: Skin is warm and dry. No rash noted. He is not diaphoretic.  Psychiatric: Mood, memory, affect and judgment normal.  Nursing note and vitals reviewed.   Urgent Care Treatments / Results:  LABS: PLEASE NOTE: all labs that were ordered this encounter are listed, however only abnormal results are displayed. Labs Reviewed  NOVEL CORONAVIRUS, NAA (HOSP ORDER, SEND-OUT TO REF LAB; TAT 18-24 HRS)    EKG: -None  RADIOLOGY: No results found.  PROCEDURES: Procedures  MEDICATIONS RECEIVED THIS VISIT: Medications - No data to display  PERTINENT CLINICAL COURSE NOTES/UPDATES:   Initial Impression / Assessment and Plan / Urgent Care Course:  Pertinent labs & imaging results that were available during my care of the  patient were personally reviewed by me and considered in my medical decision making (see lab/imaging section of note for values and interpretations).  Daniel Bray is a 84 y.o. male who presents to [facility] today with complaints of Headache, Nasal Congestion, and Fatigue   Patient overall well appearing and in no acute distress today in clinic. Presenting symptoms (see HPI) and exam as documented above. He presents with symptoms associated with SARS-CoV-2 (novel coronavirus). He has had close exposure with two individuals (wife and daughter) who tested positive for the virus yesterday. Patient has had daily contact, and has had mild symptoms for the last 3-5 days. Discussed typical symptom constellation. Reviewed potential for infection and need for testing. Patient amenable to being tested. SARS-CoV-2 swab collected by certified clinical staff. Discussed variable turn around times associated with testing, as swabs are being processed at [facility], and have been taking between 2-5 days to come back. He was advised to self quarantine, per [facility] DHHS guidelines, until negative results received. These measures are being implemented out of an  abundance of caution to prevent transmission and spread during the current SARS-CoV-2 pandemic.  Presenting symptoms consistent with acute viral illness. Until ruled out with confirmatory lab testing, SARS-CoV-2 remains part of the differential. His testing is pending at this time. I discussed with him that his symptoms are felt to be viral in nature, thus antibiotics would not offer him any relief or improve his symptoms any faster than conservative symptomatic management. Discussed supportive care measures at home during acute phase of illness. Patient to rest as much as possible. He was encouraged to ensure adequate hydration (water and ORS) to prevent dehydration and electrolyte derangements. Patient may use APAP and/or IBU on an as needed basis for  pain/fever.    Discussed follow up with primary care physician in 1 week for re-evaluation. I have reviewed the follow up and strict return precautions for any new or worsening symptoms. Patient is aware of symptoms that would be deemed urgent/emergent, and would thus require further evaluation either here or in the emergency department. At the time of discharge, he verbalized understanding and consent with the discharge plan as it was reviewed with him. All questions were fielded by provider and/or clinic staff prior to patient discharge.    Final Clinical Impressions / Urgent Care Diagnoses:   Final diagnoses:  Fatigue, unspecified type  Exposure to COVID-19 virus  Encounter for laboratory testing for COVID-19 virus    New Prescriptions:  [place] Controlled Substance Registry consulted? Not Applicable  No orders of the defined types were placed in this encounter.   Recommended Follow up Care:  Patient encouraged to follow up with the following provider within the specified time frame, or sooner as dictated by the severity of his symptoms. As always, he was instructed that for any urgent/emergent care needs, he should seek care either here or in the emergency department for more immediate evaluation.  Follow-up Information    Hortencia Pilar, MD In 1 week.   Specialty: Family Medicine Why: General reassessment of symptoms if not improving Contact information: [place] [address] [phone]         NOTE: This note was prepared using Dragon dictation software along with smaller phrase technology. Despite my best ability to proofread, there is the potential that transcriptional errors may still occur from this process, and are completely unintentional.    Karen Kitchens, NP 07/19/19 1229

## 2019-09-07 ENCOUNTER — Emergency Department: Payer: Medicare HMO

## 2019-09-07 ENCOUNTER — Other Ambulatory Visit: Payer: Self-pay

## 2019-09-07 ENCOUNTER — Encounter: Payer: Self-pay | Admitting: Intensive Care

## 2019-09-07 ENCOUNTER — Emergency Department
Admission: EM | Admit: 2019-09-07 | Discharge: 2019-09-07 | Disposition: A | Discharge status: Home / Self Care | Payer: Medicare HMO | Attending: Emergency Medicine | Admitting: Emergency Medicine

## 2019-09-07 DIAGNOSIS — I10 Essential (primary) hypertension: Secondary | ICD-10-CM | POA: Diagnosis not present

## 2019-09-07 DIAGNOSIS — R0789 Other chest pain: Secondary | ICD-10-CM | POA: Insufficient documentation

## 2019-09-07 DIAGNOSIS — I2510 Atherosclerotic heart disease of native coronary artery without angina pectoris: Secondary | ICD-10-CM | POA: Diagnosis not present

## 2019-09-07 DIAGNOSIS — Z951 Presence of aortocoronary bypass graft: Secondary | ICD-10-CM | POA: Diagnosis not present

## 2019-09-07 DIAGNOSIS — F17220 Nicotine dependence, chewing tobacco, uncomplicated: Secondary | ICD-10-CM | POA: Insufficient documentation

## 2019-09-07 DIAGNOSIS — Z7984 Long term (current) use of oral hypoglycemic drugs: Secondary | ICD-10-CM | POA: Insufficient documentation

## 2019-09-07 DIAGNOSIS — E119 Type 2 diabetes mellitus without complications: Secondary | ICD-10-CM | POA: Diagnosis not present

## 2019-09-07 DIAGNOSIS — F17210 Nicotine dependence, cigarettes, uncomplicated: Secondary | ICD-10-CM | POA: Insufficient documentation

## 2019-09-07 DIAGNOSIS — Z79899 Other long term (current) drug therapy: Secondary | ICD-10-CM | POA: Diagnosis not present

## 2019-09-07 HISTORY — DX: Hyperlipidemia, unspecified: E78.5

## 2019-09-07 LAB — CBC
HCT: 45.1 % (ref 39.0–52.0)
Hemoglobin: 14.3 g/dL (ref 13.0–17.0)
MCH: 32.1 pg (ref 26.0–34.0)
MCHC: 31.7 g/dL (ref 30.0–36.0)
MCV: 101.3 fL — ABNORMAL HIGH (ref 80.0–100.0)
Platelets: 245 10*3/uL (ref 150–400)
RBC: 4.45 MIL/uL (ref 4.22–5.81)
RDW: 13.2 % (ref 11.5–15.5)
WBC: 18.3 10*3/uL — ABNORMAL HIGH (ref 4.0–10.5)
nRBC: 0 % (ref 0.0–0.2)

## 2019-09-07 LAB — BASIC METABOLIC PANEL
Anion gap: 8 (ref 5–15)
BUN: 44 mg/dL — ABNORMAL HIGH (ref 8–23)
CO2: 24 mmol/L (ref 22–32)
Calcium: 10.4 mg/dL — ABNORMAL HIGH (ref 8.9–10.3)
Chloride: 104 mmol/L (ref 98–111)
Creatinine, Ser: 1.88 mg/dL — ABNORMAL HIGH (ref 0.61–1.24)
GFR calc Af Amer: 38 mL/min — ABNORMAL LOW (ref >60)
GFR calc non Af Amer: 33 mL/min — ABNORMAL LOW (ref >60)
Glucose, Bld: 165 mg/dL — ABNORMAL HIGH (ref 70–99)
Potassium: 4.2 mmol/L (ref 3.5–5.1)
Sodium: 136 mmol/L (ref 135–145)

## 2019-09-07 LAB — TROPONIN I (HIGH SENSITIVITY)
Troponin I (High Sensitivity): 26 ng/L — ABNORMAL HIGH (ref <18)
Troponin I (High Sensitivity): 23 ng/L — ABNORMAL HIGH (ref <18)

## 2019-09-07 MED ORDER — LIDOCAINE 5 % EX PTCH
1.0000 | MEDICATED_PATCH | CUTANEOUS | Status: DC
  Administered 2019-09-07: 15:00:00 1 via TRANSDERMAL
  Filled 2019-09-07: qty 1

## 2019-09-07 NOTE — ED Triage Notes (Signed)
Patient reports left sided chest "soreness" X2 weeks that radiates into left arm. A&O x4 in triage. NAD noted

## 2019-09-07 NOTE — Discharge Instructions (Addendum)
You should follow-up with your cardiologist as scheduled for Monday.  Your cardiac markers were slightly elevated but stable most likely they were elevated from your kidney disease as we discussed.  I do not think that this is a sign of a heart attack.  You can take the Tylenol 1 g every 8 hours and use patches to help with the pain.  Return to the ER for shortness of breath, worsening pain or any other concerns

## 2019-09-07 NOTE — ED Triage Notes (Signed)
FIRST NURSE NOTE- here for chest pain.  Pulled for EKG

## 2019-09-07 NOTE — ED Provider Notes (Signed)
[facility] Emergency Department Provider Note  ____________________________________________   First MD Initiated Contact with Patient 09/07/19 1412     (approximate)  I have reviewed the triage vital signs and the nursing notes.   HISTORY  Chief Complaint Chest Pain    HPI Daniel Bray is a 85 y.o. male with kidney disease, diabetes, hypertension, hyperlipidemia CAD who comes in with chest discomfort.  Patient states that he has been caring 2 large bags of trash out 2 weeks ago and the next day he started having some point tenderness on his left chest wall.  He can point to the spot with one finger.  Although the triage note states that he said the pain radiates down his left arm he is adamant that it does not.  He states he can point to it with one finger.  The pain has been constant, worse with pushing on it, worse with coughing, has been taking Tylenol for the pain which helps, mild in nature, worse with certain movements.  He denies any shortness of breath, leg swelling, history of blood clots.  Patient had prior stents as well as prior CABG.  Patient states that this does not feel anything like when he had his prior heart attacks given that was more pain in his jaw.  He denies any exertional chest pain.          Past Medical History:  Diagnosis Date  . Cancer ([place])    kidney  . Cancer ([place])    pancreas  . Cancer ([place])    colon   . Diabetes mellitus without complication ([place])   . Hyperlipidemia   . Hypertension   . Myocardial infarction ([place])   . Streptococcal infection    Strep Bovis  . Thyroid disease     There are no problems to display for this patient.   Past Surgical History:  Procedure Laterality Date  . CARDIAC ELECTROPHYSIOLOGY STUDY AND ABLATION    . CARDIAC SURGERY    . CORONARY ANGIOPLASTY WITH STENT PLACEMENT    . CORONARY ARTERY BYPASS GRAFT     x3  . SPLENECTOMY      Prior to Admission medications   Medication  Sig Start Date End Date Taking? Authorizing Provider  ALPRAZolam (XANAX) 0.25 MG tablet Take 0.25 mg by mouth at bedtime.    [provider]  aspirin EC 81 MG tablet Take 81 mg by mouth daily.    [provider]  cholecalciferol (VITAMIN D) 1000 units tablet Take 1,000 Units by mouth daily.    [provider]  FLORA-Q [place] Mirza) CAPS capsule Take 1 capsule by mouth daily.    [provider]  glipiZIDE (GLUCOTROL) 5 MG tablet Take by mouth daily before breakfast.    [provider]  levothyroxine (SYNTHROID, LEVOTHROID) 88 MCG tablet Take 88 mcg by mouth daily before breakfast.    [provider]  lisinopril (PRINIVIL,ZESTRIL) 5 MG tablet Take 5 mg by mouth 2 (two) times daily.    [provider]  omeprazole (PRILOSEC) 40 MG capsule Take 40 mg by mouth daily.    [provider]  ondansetron (ZOFRAN ODT) 4 MG disintegrating tablet Take 1 tablet (4 mg total) by mouth every 8 (eight) hours as needed for nausea or vomiting. 08/13/16   Loney Hering, MD  simvastatin (ZOCOR) 40 MG tablet Take 40 mg by mouth daily.    [provider]  tamsulosin (FLOMAX) 0.4 MG CAPS capsule Take 0.4 mg  by mouth daily after supper.    [provider]    Allergies Levaquin [levofloxacin in d5w] and Januvia [sitagliptin]  Family History  Family history unknown: Yes    Social History Social History   Tobacco Use  . Smoking status: Current Every Day Smoker    Packs/day: 1.00    Types: Cigars  . Smokeless tobacco: Current User    Types: Chew  Substance Use Topics  . Alcohol use: No  . Drug use: No      Review of Systems Constitutional: No fever/chills Eyes: No visual changes. ENT: No sore throat. Cardiovascular: Positive chest pain Respiratory: Denies shortness of breath. Gastrointestinal: No abdominal pain.  No nausea, no vomiting.  No diarrhea.  No constipation. Genitourinary: Negative for  dysuria. Musculoskeletal: Negative for back pain. Skin: Negative for rash. Neurological: Negative for headaches, focal weakness or numbness. All other ROS negative ____________________________________________   PHYSICAL EXAM:  VITAL SIGNS: ED Triage Vitals  Enc Vitals Group     BP 09/07/19 1230 128/73     Pulse Rate 09/07/19 1230 60     Resp 09/07/19 1230 14     Temp 09/07/19 1230 98.5 F (36.9 C)     Temp Source 09/07/19 1230 Oral     SpO2 09/07/19 1230 98 %     Weight 09/07/19 1231 155 lb (70.3 kg)     Height 09/07/19 1231 5' 10.5" (1.791 m)     Head Circumference --      Peak Flow --      Pain Score 09/07/19 1231 0     Pain Loc --      Pain Edu? --      Excl. in [place]? --     Constitutional: Alert and oriented. Well appearing and in no acute distress. Eyes: Conjunctivae are normal. EOMI. Head: Atraumatic. Nose: No congestion/rhinnorhea. Mouth/Throat: Mucous membranes are moist.   Neck: No stridor. Trachea Midline. FROM Cardiovascular: Normal rate, regular rhythm. Grossly normal heart sounds.  Good peripheral circulation.  Point tenderness on his chest wall. Respiratory: Normal respiratory effort.  No retractions. Lungs CTAB. Gastrointestinal: Soft and nontender. No distention. No abdominal bruits.  Musculoskeletal: No lower extremity tenderness nor edema.  No joint effusions. Neurologic:  Normal speech and language. No gross focal neurologic deficits are appreciated.  Skin:  Skin is warm, dry and intact. No rash noted. Psychiatric: Mood and affect are normal. Speech and behavior are normal. GU: Deferred   ____________________________________________   LABS (all labs ordered are listed, but only abnormal results are displayed)  Labs Reviewed  BASIC METABOLIC PANEL - Abnormal; Notable for the following components:      Result Value   Glucose, Bld 165 (*)    BUN 44 (*)    Creatinine, Ser 1.88 (*)    Calcium 10.4 (*)    GFR calc non Af Amer 33 (*)    GFR calc Af  Amer 38 (*)    All other components within normal limits  CBC - Abnormal; Notable for the following components:   WBC 18.3 (*)    MCV 101.3 (*)    All other components within normal limits  TROPONIN I (HIGH SENSITIVITY) - Abnormal; Notable for the following components:   Troponin I (High Sensitivity) 26 (*)    All other components within normal limits  TROPONIN I (HIGH SENSITIVITY)   ____________________________________________   ED ECG REPORT I, Vanessa [place], the attending physician, personally viewed and interpreted this ECG.  EKG is normal sinus  rate of 61, no ST elevation, no T wave inversions, normal intervals ____________________________________________  RADIOLOGY Robert Bellow, personally viewed and evaluated these images (plain radiographs) as part of my medical decision making, as well as reviewing the written report by the radiologist.  ED MD interpretation: No acute findings.  Official radiology report(s): DG Chest 2 View  Result Date: 09/07/2019 CLINICAL DATA:  Chest pain. EXAM: CHEST - 2 VIEW COMPARISON:  February 07, 2015. FINDINGS: The heart size and mediastinal contours are within normal limits. Status post coronary bypass graft. No pneumothorax or pleural effusion is noted. Both lungs are clear. The visualized skeletal structures are unremarkable. IMPRESSION: No active cardiopulmonary disease. Electronically Signed   By: Marijo Conception M.D.   On: 09/07/2019 13:15    ____________________________________________   PROCEDURES  Procedure(s) performed (including Critical Care):  Procedures   ____________________________________________   INITIAL IMPRESSION / ASSESSMENT AND PLAN / ED COURSE   Daniel Bray was evaluated in Emergency Department on 09/07/2019 for the symptoms described in the history of present illness. He was evaluated in the context of the global COVID-19 pandemic, which necessitated consideration that the patient might be at risk for  infection with the SARS-CoV-2 virus that causes COVID-19. Institutional protocols and algorithms that pertain to the evaluation of patients at risk for COVID-19 are in a state of rapid change based on information released by regulatory bodies including the CDC and federal and state organizations. These policies and algorithms were followed during the patient's care in the ED.    Most Likely DDx:  -MSK (atypical chest pain) but will get cardiac markers to evaluate for ACS given risk factors/age   DDx that was also considered d/t potential to cause harm, but was found less likely based on history and physical (as detailed above): -PNA (no fevers, cough but CXR to evaluate) -PNX (reassured with equal b/l breath sounds, CXR to evaluate) -Symptomatic anemia (will get H&H) -Pulmonary embolism as no sob at rest, not pleuritic in nature, no hypoxia -Aortic Dissection as no tearing pain and no radiation to the mid back, pulses equal -Pericarditis no rub on exam, EKG changes or hx to suggest dx -Tamponade (no notable SOB, tachycardic, hypotensive) -Esophageal rupture (no h/o diffuse vomitting/no crepitus) -No pain in his abdomen to suggest abdominal process.   Discussed with patient that he does have risk factors for cardiac disease but his story does not sound consistent with pain from his heart.  Patient states that he was hoping that he could go home.  I did explain to patient that his for troponin was slightly elevated most likely from his ckd.  Patient is okay with repeating this marker to make sure it staying stable.  Patient states that the pain is very minimal in nature.  Will provide a lidocaine patch.  Patient's kidney functions at baseline.  No evidence of anemia.  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    Repeat troponin is stable.  Patient states he is follow-up with his cardiologist on Monday and would prefer to  be able to follow-up with him.  I discussed the provisional nature of ED diagnosis, the treatment so far, the ongoing plan of care, follow up appointments and return precautions with the patient and any family or support people present. They expressed understanding and agreed with the plan, discharged home.   ____________________________________________   FINAL CLINICAL IMPRESSION(S) / ED DIAGNOSES  Final diagnoses:  Chest wall pain     MEDICATIONS GIVEN DURING THIS VISIT:  Medications  lidocaine (LIDODERM) 5 % 1 patch (1 patch Transdermal Patch Applied 09/07/19 1526)     ED Discharge Orders    None       Note:  This document was prepared using Dragon voice recognition software and may include unintentional dictation errors.   Vanessa [place], MD 09/07/19 [phone]

## 2019-10-12 ENCOUNTER — Emergency Department: Payer: Medicare HMO

## 2019-10-12 ENCOUNTER — Observation Stay
Admission: EM | Admit: 2019-10-12 | Discharge: 2019-10-13 | Disposition: A | Discharge status: Home / Self Care | Payer: Medicare HMO | Attending: Internal Medicine | Admitting: Internal Medicine

## 2019-10-12 ENCOUNTER — Other Ambulatory Visit: Payer: Self-pay

## 2019-10-12 DIAGNOSIS — D72829 Elevated white blood cell count, unspecified: Secondary | ICD-10-CM | POA: Insufficient documentation

## 2019-10-12 DIAGNOSIS — S0181XA Laceration without foreign body of other part of head, initial encounter: Secondary | ICD-10-CM

## 2019-10-12 DIAGNOSIS — I2510 Atherosclerotic heart disease of native coronary artery without angina pectoris: Secondary | ICD-10-CM | POA: Diagnosis not present

## 2019-10-12 DIAGNOSIS — Z85528 Personal history of other malignant neoplasm of kidney: Secondary | ICD-10-CM

## 2019-10-12 DIAGNOSIS — I10 Essential (primary) hypertension: Secondary | ICD-10-CM | POA: Diagnosis present

## 2019-10-12 DIAGNOSIS — I252 Old myocardial infarction: Secondary | ICD-10-CM | POA: Insufficient documentation

## 2019-10-12 DIAGNOSIS — Z881 Allergy status to other antibiotic agents status: Secondary | ICD-10-CM | POA: Insufficient documentation

## 2019-10-12 DIAGNOSIS — R112 Nausea with vomiting, unspecified: Secondary | ICD-10-CM | POA: Diagnosis not present

## 2019-10-12 DIAGNOSIS — Z7984 Long term (current) use of oral hypoglycemic drugs: Secondary | ICD-10-CM | POA: Insufficient documentation

## 2019-10-12 DIAGNOSIS — I700 Atherosclerosis of aorta: Secondary | ICD-10-CM | POA: Diagnosis not present

## 2019-10-12 DIAGNOSIS — R197 Diarrhea, unspecified: Secondary | ICD-10-CM | POA: Insufficient documentation

## 2019-10-12 DIAGNOSIS — Z888 Allergy status to other drugs, medicaments and biological substances status: Secondary | ICD-10-CM | POA: Insufficient documentation

## 2019-10-12 DIAGNOSIS — Z955 Presence of coronary angioplasty implant and graft: Secondary | ICD-10-CM | POA: Insufficient documentation

## 2019-10-12 DIAGNOSIS — Z7982 Long term (current) use of aspirin: Secondary | ICD-10-CM | POA: Insufficient documentation

## 2019-10-12 DIAGNOSIS — E039 Hypothyroidism, unspecified: Secondary | ICD-10-CM | POA: Diagnosis not present

## 2019-10-12 DIAGNOSIS — W19XXXA Unspecified fall, initial encounter: Secondary | ICD-10-CM | POA: Diagnosis not present

## 2019-10-12 DIAGNOSIS — Z79899 Other long term (current) drug therapy: Secondary | ICD-10-CM | POA: Insufficient documentation

## 2019-10-12 DIAGNOSIS — E785 Hyperlipidemia, unspecified: Secondary | ICD-10-CM | POA: Diagnosis present

## 2019-10-12 DIAGNOSIS — Z96641 Presence of right artificial hip joint: Secondary | ICD-10-CM | POA: Insufficient documentation

## 2019-10-12 DIAGNOSIS — Z9081 Acquired absence of spleen: Secondary | ICD-10-CM | POA: Insufficient documentation

## 2019-10-12 DIAGNOSIS — N183 Chronic kidney disease, stage 3 unspecified: Secondary | ICD-10-CM | POA: Diagnosis not present

## 2019-10-12 DIAGNOSIS — R531 Weakness: Secondary | ICD-10-CM | POA: Diagnosis not present

## 2019-10-12 DIAGNOSIS — Z20822 Contact with and (suspected) exposure to covid-19: Secondary | ICD-10-CM | POA: Insufficient documentation

## 2019-10-12 DIAGNOSIS — I493 Ventricular premature depolarization: Secondary | ICD-10-CM | POA: Diagnosis not present

## 2019-10-12 DIAGNOSIS — K56609 Unspecified intestinal obstruction, unspecified as to partial versus complete obstruction: Secondary | ICD-10-CM

## 2019-10-12 DIAGNOSIS — K56600 Partial intestinal obstruction, unspecified as to cause: Secondary | ICD-10-CM | POA: Diagnosis present

## 2019-10-12 DIAGNOSIS — Z90411 Acquired partial absence of pancreas: Secondary | ICD-10-CM | POA: Insufficient documentation

## 2019-10-12 DIAGNOSIS — I255 Ischemic cardiomyopathy: Secondary | ICD-10-CM | POA: Insufficient documentation

## 2019-10-12 DIAGNOSIS — I6782 Cerebral ischemia: Secondary | ICD-10-CM | POA: Insufficient documentation

## 2019-10-12 DIAGNOSIS — F17290 Nicotine dependence, other tobacco product, uncomplicated: Secondary | ICD-10-CM | POA: Insufficient documentation

## 2019-10-12 DIAGNOSIS — R55 Syncope and collapse: Secondary | ICD-10-CM | POA: Diagnosis present

## 2019-10-12 DIAGNOSIS — I129 Hypertensive chronic kidney disease with stage 1 through stage 4 chronic kidney disease, or unspecified chronic kidney disease: Secondary | ICD-10-CM | POA: Insufficient documentation

## 2019-10-12 DIAGNOSIS — I4510 Unspecified right bundle-branch block: Secondary | ICD-10-CM | POA: Insufficient documentation

## 2019-10-12 DIAGNOSIS — E1169 Type 2 diabetes mellitus with other specified complication: Secondary | ICD-10-CM | POA: Diagnosis present

## 2019-10-12 DIAGNOSIS — K3189 Other diseases of stomach and duodenum: Secondary | ICD-10-CM | POA: Insufficient documentation

## 2019-10-12 DIAGNOSIS — Z951 Presence of aortocoronary bypass graft: Secondary | ICD-10-CM | POA: Insufficient documentation

## 2019-10-12 DIAGNOSIS — K4020 Bilateral inguinal hernia, without obstruction or gangrene, not specified as recurrent: Secondary | ICD-10-CM | POA: Diagnosis not present

## 2019-10-12 DIAGNOSIS — E1122 Type 2 diabetes mellitus with diabetic chronic kidney disease: Secondary | ICD-10-CM | POA: Insufficient documentation

## 2019-10-12 DIAGNOSIS — I672 Cerebral atherosclerosis: Secondary | ICD-10-CM | POA: Insufficient documentation

## 2019-10-12 DIAGNOSIS — Z905 Acquired absence of kidney: Secondary | ICD-10-CM | POA: Insufficient documentation

## 2019-10-12 LAB — COMPREHENSIVE METABOLIC PANEL
ALT: 37 U/L (ref 0–44)
AST: 37 U/L (ref 15–41)
Albumin: 4.4 g/dL (ref 3.5–5.0)
Alkaline Phosphatase: 61 U/L (ref 38–126)
Anion gap: 10 (ref 5–15)
BUN: 34 mg/dL — ABNORMAL HIGH (ref 8–23)
CO2: 24 mmol/L (ref 22–32)
Calcium: 10.4 mg/dL — ABNORMAL HIGH (ref 8.9–10.3)
Chloride: 101 mmol/L (ref 98–111)
Creatinine, Ser: 1.71 mg/dL — ABNORMAL HIGH (ref 0.61–1.24)
GFR calc Af Amer: 42 mL/min — ABNORMAL LOW (ref >60)
GFR calc non Af Amer: 36 mL/min — ABNORMAL LOW (ref >60)
Glucose, Bld: 226 mg/dL — ABNORMAL HIGH (ref 70–99)
Potassium: 4.8 mmol/L (ref 3.5–5.1)
Sodium: 135 mmol/L (ref 135–145)
Total Bilirubin: 1 mg/dL (ref 0.3–1.2)
Total Protein: 7.8 g/dL (ref 6.5–8.1)

## 2019-10-12 LAB — CBC WITH DIFFERENTIAL/PLATELET
Abs Immature Granulocytes: 0.22 10*3/uL — ABNORMAL HIGH (ref 0.00–0.07)
Basophils Absolute: 0.1 10*3/uL (ref 0.0–0.1)
Basophils Relative: 0 %
Eosinophils Absolute: 0.2 10*3/uL (ref 0.0–0.5)
Eosinophils Relative: 1 %
HCT: 48.9 % (ref 39.0–52.0)
Hemoglobin: 15.8 g/dL (ref 13.0–17.0)
Immature Granulocytes: 1 %
Lymphocytes Relative: 22 %
Lymphs Abs: 6.1 10*3/uL — ABNORMAL HIGH (ref 0.7–4.0)
MCH: 33.4 pg (ref 26.0–34.0)
MCHC: 32.3 g/dL (ref 30.0–36.0)
MCV: 103.4 fL — ABNORMAL HIGH (ref 80.0–100.0)
Monocytes Absolute: 2.6 10*3/uL — ABNORMAL HIGH (ref 0.1–1.0)
Monocytes Relative: 10 %
Neutro Abs: 18 10*3/uL — ABNORMAL HIGH (ref 1.7–7.7)
Neutrophils Relative %: 66 %
Platelets: 226 10*3/uL (ref 150–400)
RBC: 4.73 MIL/uL (ref 4.22–5.81)
RDW: 13.6 % (ref 11.5–15.5)
Smear Review: NORMAL
WBC Morphology: ABNORMAL
WBC: 27.2 10*3/uL — ABNORMAL HIGH (ref 4.0–10.5)
nRBC: 0 % (ref 0.0–0.2)

## 2019-10-12 LAB — LACTIC ACID, PLASMA
Lactic Acid, Venous: 1.6 mmol/L (ref 0.5–1.9)
Lactic Acid, Venous: 1.2 mmol/L (ref 0.5–1.9)

## 2019-10-12 LAB — URINALYSIS, COMPLETE (UACMP) WITH MICROSCOPIC
Bacteria, UA: NONE SEEN
Bilirubin Urine: NEGATIVE
Glucose, UA: 500 mg/dL — AB
Hgb urine dipstick: NEGATIVE
Ketones, ur: NEGATIVE mg/dL
Leukocytes,Ua: NEGATIVE
Nitrite: NEGATIVE
Protein, ur: 30 mg/dL — AB
Specific Gravity, Urine: 1.015 (ref 1.005–1.030)
Squamous Epithelial / HPF: NONE SEEN (ref 0–5)
pH: 6 (ref 5.0–8.0)

## 2019-10-12 LAB — HEMOGLOBIN A1C
Hgb A1c MFr Bld: 7.6 % — ABNORMAL HIGH (ref 4.8–5.6)
Mean Plasma Glucose: 171.42 mg/dL

## 2019-10-12 LAB — RESPIRATORY PANEL BY RT PCR (FLU A&B, COVID)
Influenza A by PCR: NEGATIVE
Influenza B by PCR: NEGATIVE
SARS Coronavirus 2 by RT PCR: NEGATIVE

## 2019-10-12 LAB — GLUCOSE, CAPILLARY
Glucose-Capillary: 195 mg/dL — ABNORMAL HIGH (ref 70–99)
Glucose-Capillary: 173 mg/dL — ABNORMAL HIGH (ref 70–99)
Glucose-Capillary: 219 mg/dL — ABNORMAL HIGH (ref 70–99)

## 2019-10-12 LAB — LIPASE, BLOOD: Lipase: 15 U/L (ref 11–51)

## 2019-10-12 LAB — TROPONIN I (HIGH SENSITIVITY)
Troponin I (High Sensitivity): 28 ng/L — ABNORMAL HIGH (ref <18)
Troponin I (High Sensitivity): 25 ng/L — ABNORMAL HIGH (ref <18)

## 2019-10-12 MED ORDER — ONDANSETRON HCL 4 MG/2ML IJ SOLN: 4.0000 mg | Freq: Four times a day (QID) | INTRAMUSCULAR | Status: DC | PRN

## 2019-10-12 MED ORDER — LABETALOL HCL 5 MG/ML IV SOLN
10.0000 mg | Freq: Four times a day (QID) | INTRAVENOUS | Status: DC | PRN
  Administered 2019-10-12: 10 mg via INTRAVENOUS
  Filled 2019-10-12: qty 4

## 2019-10-12 MED ORDER — CARVEDILOL 6.25 MG PO TABS
6.2500 mg | ORAL_TABLET | Freq: Two times a day (BID) | ORAL | Status: DC
  Administered 2019-10-13: 6.25 mg via ORAL
  Filled 2019-10-12: qty 1

## 2019-10-12 MED ORDER — LISINOPRIL 10 MG PO TABS
20.0000 mg | ORAL_TABLET | Freq: Every day | ORAL | Status: DC
  Administered 2019-10-12 – 2019-10-13 (×2): 20 mg via ORAL
  Filled 2019-10-12 (×2): qty 2

## 2019-10-12 MED ORDER — SODIUM CHLORIDE 0.9 % IV BOLUS
500.0000 mL | Freq: Once | INTRAVENOUS | Status: AC
  Administered 2019-10-12: 500 mL via INTRAVENOUS

## 2019-10-12 MED ORDER — SODIUM CHLORIDE 0.9% FLUSH
3.0000 mL | Freq: Two times a day (BID) | INTRAVENOUS | Status: DC
  Administered 2019-10-12: 3 mL via INTRAVENOUS

## 2019-10-12 MED ORDER — INSULIN ASPART 100 UNIT/ML ~~LOC~~ SOLN
0.0000 [IU] | Freq: Three times a day (TID) | SUBCUTANEOUS | Status: DC
  Administered 2019-10-12 – 2019-10-13 (×3): 3 [IU] via SUBCUTANEOUS
  Filled 2019-10-12 (×3): qty 1

## 2019-10-12 MED ORDER — HEPARIN SODIUM (PORCINE) 5000 UNIT/ML IJ SOLN
5000.0000 [IU] | Freq: Three times a day (TID) | INTRAMUSCULAR | Status: DC
  Administered 2019-10-12 – 2019-10-13 (×3): 5000 [IU] via SUBCUTANEOUS
  Filled 2019-10-12 (×3): qty 1

## 2019-10-12 MED ORDER — DEXTROSE-NACL 5-0.9 % IV SOLN: INTRAVENOUS | Status: DC

## 2019-10-12 MED ORDER — ONDANSETRON HCL 4 MG/2ML IJ SOLN
4.0000 mg | Freq: Once | INTRAMUSCULAR | Status: AC
  Administered 2019-10-12: 4 mg via INTRAVENOUS
  Filled 2019-10-12: qty 2

## 2019-10-12 MED ORDER — PANTOPRAZOLE SODIUM 40 MG IV SOLR
40.0000 mg | INTRAVENOUS | Status: DC
  Administered 2019-10-12 – 2019-10-13 (×2): 40 mg via INTRAVENOUS
  Filled 2019-10-12 (×2): qty 40

## 2019-10-12 MED ORDER — ONDANSETRON HCL 4 MG PO TABS: 4.0000 mg | ORAL_TABLET | Freq: Four times a day (QID) | ORAL | Status: DC | PRN

## 2019-10-12 NOTE — ED Triage Notes (Addendum)
Pt comes via [facility] from home with c/o LOC and fall. Pt states he had went to [place] and was sitting on toilet when he passed out and fell forward hitting his hand.  Pt states D/V that started this am. EMS stated he had another syncopal episode upon their arrival. EMS reports VSS, pt on 2L [place]. Pt is not on blood thinners. Pt has 2 inch laceration noted to mid of forehead.   Pt denies any pain at this time.

## 2019-10-12 NOTE — ED Notes (Signed)
Attempted report. Floor states will call this RN back once room ready. Name and ascom number left.

## 2019-10-12 NOTE — H&P (Signed)
History and Physical    Daniel Bray [id number] DOB: 1934/08/15 DOA: 10/12/2019  PCP: Hortencia Pilar, MD   Patient coming from: Home  I have personally briefly reviewed patient's old medical records in [place]  Chief Complaint: "I passed out"  HPI: Daniel Bray is a 85 y.o. male with medical history significant for renal cell carcinoma s/p left kidney resection, s/p resection of a portion of the pancreas and colon , diabetes mellitus with complications of stage III chronic kidney disease, hypertension, coronary artery disease and dyslipidemia who presented to the emergency room after he had a syncopal episode this morning.  Patient stated that he woke up around 4:30 AM with abdominal discomfort and diarrhea.  He was on the commode around 7 AM when he felt clammy and weak and then lost consciousness.  He fell and hit his head has a laceration on his forehead.  Abdominal discomfort is associated with nausea and he had one episode of emesis in the emergency room. He denies having any chest pain, shortness of breath, urinary symptoms, headache, fever or chills  ED Course: Patient presented to the emergency room after he had a syncopal episode at home.  He complained of feeling lightheaded and clammy prior to losing consciousness.  Patient had a CT scan of abdomen and pelvis which showed partial small bowel obstruction or ileus with gradual transition occurring in the posterior aspect of the splenectomy bed.  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  Patient will be admitted to the hospital for further evaluation  Review of Systems: As per HPI otherwise 10 point review of systems negative.    Past Medical History:  Diagnosis Date  . Cancer ([place])    kidney  . Cancer ([place])    pancreas  . Cancer ([place])    colon   . Diabetes mellitus without  complication ([place])   . Hyperlipidemia   . Hypertension   . Myocardial infarction ([place])   . Streptococcal infection    Strep Bovis  . Thyroid disease     Past Surgical History:  Procedure Laterality Date  . CARDIAC ELECTROPHYSIOLOGY STUDY AND ABLATION    . CARDIAC SURGERY    . CORONARY ANGIOPLASTY WITH STENT PLACEMENT    . CORONARY ARTERY BYPASS GRAFT     x3  . SPLENECTOMY       reports that he has been smoking cigars. He has been smoking about 1.00 pack per day. His smokeless tobacco use includes chew. He reports that he does not drink alcohol or use drugs.  Allergies  Allergen Reactions  . Levaquin [Levofloxacin In D5w]     Weakness, low blood pressure  . Januvia [Sitagliptin] Rash    Family History  Family history unknown: Yes     Prior to Admission medications   Medication Sig Start Date End Date Taking? Authorizing Provider  acetaminophen (TYLENOL) 650 MG CR tablet Take 650 mg by mouth 2 (two) times daily.   Yes [provider]  ALPRAZolam (XANAX) 0.25 MG tablet Take 0.125 mg by mouth at bedtime as needed for sleep.    Yes [provider]  aspirin EC 81 MG tablet Take 81 mg by mouth daily.   Yes [provider]  cholecalciferol (VITAMIN D) 1000 units tablet Take 2,500 Units by mouth daily.    Yes [provider]  DM-APAP-CPM (CORICIDIN HBP) 10-325-2 MG TABS  Take 10 mg by mouth 2 (two) times daily.   Yes [provider]  Dulaglutide (TRULICITY) A999333 [id number] SOPN Inject 0.75 mg into the skin once a week.   Yes [provider]  empagliflozin (JARDIANCE) 10 MG TABS tablet Take 10 mg by mouth daily.   Yes [provider]  gabapentin (NEURONTIN) 300 MG capsule Take 300 mg by mouth at bedtime.   Yes [provider]  hydrALAZINE (APRESOLINE) 25 MG tablet Take 25 mg by mouth 2 (two) times daily.   Yes [provider]  omeprazole (PRILOSEC) 40 MG capsule Take 20 mg by mouth daily.    Yes [provider]  pyridoxine (B-6) 100 MG tablet Take 100 mg by mouth daily.   Yes [provider]  rosuvastatin (CRESTOR) 40 MG tablet Take 40 mg by mouth daily.   Yes [provider]  glipiZIDE (GLUCOTROL) 5 MG tablet Take 10 mg by mouth 2 (two) times daily with a meal.     [provider]  levothyroxine (SYNTHROID) 75 MCG tablet Take 75 mcg by mouth daily before breakfast.     [provider]  lisinopril (PRINIVIL,ZESTRIL) 5 MG tablet Take 20 mg by mouth 2 (two) times daily.     [provider]    Physical Exam: Vitals:   10/12/19 1145 10/12/19 1200 10/12/19 1205 10/12/19 1215  BP:      Pulse: 71 70  68  Resp:   14 19  Temp:      SpO2: 100% 99%  99%  Weight:      Height:         Vitals:   10/12/19 1145 10/12/19 1200 10/12/19 1205 10/12/19 1215  BP:      Pulse: 71 70  68  Resp:   14 19  Temp:      SpO2: 100% 99%  99%  Weight:      Height:        Constitutional: NAD, alert and oriented to person, place and time.  Laceration on the forehead sutured in the ER Eyes: PERRL, lids and conjunctivae normal ENMT: Mucous membranes are moist.  Neck: normal, supple, no masses, no thyromegaly Respiratory: clear to auscultation bilaterally, no wheezing, no crackles. Normal respiratory effort. No accessory muscle use.  Cardiovascular: Regular rate and rhythm,no murmurs / rubs / gallops. No extremity edema. 2+ pedal pulses. No carotid bruits.  Abdomen: no tenderness, no masses palpated. No hepatosplenomegaly. Bowel sounds positive.  Musculoskeletal: no clubbing / cyanosis. No joint deformity upper and lower extremities.  Skin: no rashes, lesions, ulcers.  Neurologic: No gross focal neurologic deficit. Psychiatric: Normal mood and affect.   Labs on Admission: I have personally reviewed following labs and imaging studies  CBC: Recent Labs  Lab 10/12/19 0739  WBC 27.2*  NEUTROABS 18.0*  HGB 15.8  HCT 48.9  MCV 103.4*  PLT [id number]   Basic  Metabolic Panel: Recent Labs  Lab 10/12/19 0739  NA 135  K 4.8  CL 101  CO2 24  GLUCOSE 226*  BUN 34*  CREATININE 1.71*  CALCIUM 10.4*   GFR: Estimated Creatinine Clearance: 31.7 mL/min (A) (by C-G formula based on SCr of 1.71 mg/dL (H)). Liver Function Tests: Recent Labs  Lab 10/12/19 0739  AST 37  ALT 37  ALKPHOS 61  BILITOT 1.0  PROT 7.8  ALBUMIN 4.4   Recent Labs  Lab 10/12/19 0739  LIPASE 15   No results for input(s): AMMONIA in the last 168 hours. Coagulation  Profile: No results for input(s): INR, PROTIME in the last 168 hours. Cardiac Enzymes: No results for input(s): CKTOTAL, CKMB, CKMBINDEX, TROPONINI in the last 168 hours. BNP (last 3 results) No results for input(s): PROBNP in the last 8760 hours. HbA1C: No results for input(s): HGBA1C in the last 72 hours. CBG: No results for input(s): GLUCAP in the last 168 hours. Lipid Profile: No results for input(s): CHOL, HDL, LDLCALC, TRIG, CHOLHDL, LDLDIRECT in the last 72 hours. Thyroid Function Tests: No results for input(s): TSH, T4TOTAL, FREET4, T3FREE, THYROIDAB in the last 72 hours. Anemia Panel: No results for input(s): VITAMINB12, FOLATE, FERRITIN, TIBC, IRON, RETICCTPCT in the last 72 hours. Urine analysis:    Component Value Date/Time   COLORURINE YELLOW 10/12/2019 [place] 10/12/2019 0949   LABSPEC 1.015 10/12/2019 0949   PHURINE 6.0 10/12/2019 0949   GLUCOSEU 500 (A) 10/12/2019 0949   HGBUR NEGATIVE 10/12/2019 0949   BILIRUBINUR NEGATIVE 10/12/2019 [place] 10/12/2019 0949   PROTEINUR 30 (A) 10/12/2019 0949   NITRITE NEGATIVE 10/12/2019 0949   LEUKOCYTESUR NEGATIVE 10/12/2019 0949    Radiological Exams on Admission: CT ABDOMEN PELVIS WO CONTRAST  Result Date: 10/12/2019 CLINICAL DATA:  Left upper quadrant pain elevated white blood cell count EXAM: CT ABDOMEN AND PELVIS WITHOUT CONTRAST TECHNIQUE: Multidetector CT imaging of the abdomen and pelvis was  performed following the standard protocol without IV contrast. COMPARISON:  05/12/2007 FINDINGS: Lower chest: Lung bases with basilar atelectasis. Coronary artery disease with calcification, three-vessel disease. Post median sternotomy. Heart is incompletely imaged. No signs of pericardial or pleural effusion. Hepatobiliary: Signs of hepatic cysts. Mildly lobular hepatic contours. No focal, suspicious hepatic lesion. Gallbladder is normal without biliary ductal distension. Pancreas: Mass lesion in the pancreatic head. Postoperative changes of subtotal pancreatectomy and splenectomy also associated with atrophy of the pancreas. Mass lesion measuring 3.6 x 3.2 cm. Spleen: Surgically absent. Adrenals/Urinary Tract: A portion of the left adrenal may remain in place following left nephrectomy. Right adrenal is normal. Scarring of the right kidney, no signs of hydronephrosis. No significant perinephric stranding. Stomach/Bowel: Moderate gastric distension. No perigastric stranding. Small bowel with dilation in the left upper quadrant, dilated to approximately 3-1/2 cm transition occurring in the posterior aspect of the splenectomy bed. Distal small bowel loops are decompressed. Signs of subtotal colectomy with colonic anastomosis in the lower abdomen. Signs of colonic diverticulosis. Some gas and stool in the colon. Vascular/Lymphatic: Calcified atherosclerotic changes throughout the abdominal aorta. Postoperative changes of retroperitoneal lymph node dissection along the left periaortic chain. Reproductive: Prostate unremarkable by CT. Other: Fat containing small bilateral inguinal hernias. No signs of pelvic lymphadenopathy. Limited assessment of the pelvis due to streak artifact from hip arthroplasty changes on the right. Musculoskeletal: Post median sternotomy. No acute bone process. Spinal degenerative changes. Right hip arthroplasty. IMPRESSION: 1. Partial small bowel obstruction or ileus with gradual transition  occurring in the posterior aspect of the splenectomy bed. 2. 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 3. Signs of subtotal colectomy with small bowel to colonic anastomosis in the lower abdomen. 4. Fat containing bilateral inguinal hernias. 5. Coronary artery disease. Aortic Atherosclerosis (ICD10-I70.0). Electronically Signed   By: Zetta Bills M.D.   On: 10/12/2019 11:09   CT Head Wo Contrast  Result Date: 10/12/2019 CLINICAL DATA:  Syncope, fall, head injury EXAM: CT HEAD WITHOUT CONTRAST CT CERVICAL SPINE  WITHOUT CONTRAST TECHNIQUE: Multidetector CT imaging of the head and cervical spine was performed following the standard protocol without intravenous contrast. Multiplanar CT image reconstructions of the cervical spine were also generated. COMPARISON:  08/13/2016 FINDINGS: CT HEAD FINDINGS Brain: No evidence of acute infarction, hemorrhage, hydrocephalus, extra-axial collection or mass lesion/mass effect. Mild subcortical white matter and periventricular small vessel ischemic changes. Vascular: Mild intracranial atherosclerosis. Skull: Normal. Negative for fracture or focal lesion. Sinuses/Orbits: Partial opacification of the right ethmoid air cells. Visualized paranasal sinuses and mastoid air cells are otherwise clear. Other: None. CT CERVICAL SPINE FINDINGS Alignment: Straightening of the cervical spine. Skull base and vertebrae: No acute fracture. No primary bone lesion or focal pathologic process. Soft tissues and spinal canal: No prevertebral fluid or swelling. No visible canal hematoma. Disc levels: Moderate degenerative changes of the mid/lower cervical spine. Spinal canal is patent. Upper chest: Visualized lung apices are clear. Other: Visualized thyroid is unremarkable. IMPRESSION: No evidence of acute intracranial abnormality. Atrophy with small vessel ischemic changes. No  evidence of traumatic injury to the cervical spine. Moderate degenerative changes. Electronically Signed   By: Julian Hy M.D.   On: 10/12/2019 08:57   CT Cervical Spine Wo Contrast  Result Date: 10/12/2019 CLINICAL DATA:  Syncope, fall, head injury EXAM: CT HEAD WITHOUT CONTRAST CT CERVICAL SPINE WITHOUT CONTRAST TECHNIQUE: Multidetector CT imaging of the head and cervical spine was performed following the standard protocol without intravenous contrast. Multiplanar CT image reconstructions of the cervical spine were also generated. COMPARISON:  08/13/2016 FINDINGS: CT HEAD FINDINGS Brain: No evidence of acute infarction, hemorrhage, hydrocephalus, extra-axial collection or mass lesion/mass effect. Mild subcortical white matter and periventricular small vessel ischemic changes. Vascular: Mild cranial atherosclerosis. Skull: Normal. Negative for fracture or focal lesion. Sinuses/Orbits: Partial opacification of the right ethmoid air cells. Visualized paranasal sinuses and mastoid air cells are otherwise clear. Other: None. CT CERVICAL SPINE FINDINGS Alignment: Straightening of the cervical spine. Skull base and vertebrae: No acute fracture. No primary bone lesion or focal pathologic process. Soft tissues and spinal canal: No prevertebral fluid or swelling. No visible canal hematoma. Disc levels: Moderate degenerative changes of the mid/lower cervical spine. Spinal canal is patent. Upper chest: Visualized lung apices are clear. Other: Visualized thyroid is unremarkable. IMPRESSION: No evidence of acute intracranial abnormality. Atrophy with small vessel ischemic changes. No evidence of traumatic injury to the cervical spine. Moderate degenerative changes. Electronically Signed   By: Julian Hy M.D.   On: 10/12/2019 09:11    EKG: Independently reviewed.  Sinus rhythm Incomplete right bundle branch block PVCs  Assessment/Plan Active Problems:   Syncope and collapse   Type 2 diabetes mellitus  with stage 3 chronic kidney disease ([facility])   Essential hypertension   History of renal cell carcinoma   SBO (small bowel obstruction) ([facility])   Syncope and collapse Most likely vasovagal Patient with complaints of crampy abdominal discomfort, nausea, vomiting and diarrhea Orthostatic blood pressure checks 2D Echocardiogram to assess LVEF   Partial small bowel obstruction May be secondary to adhesions from prior abdominal surgery Keep patient n.p.o. Supportive care with IV fluids, pain control and antiemetics Gastric decompression if emesis becomes persistent Consult surgery   Type 2 diabetes mellitus with complications of stage III chronic kidney disease Renal function appears stable Keep patient n.p.o. for now IV fluid hydration Sliding scale coverage   Hypertension Place patient on IV labetalol for SBP > 14mmHg   History of renal cell carcinoma Patient is status post left nephrectomy  in 1987 and in 2007 had resection of metastatic disease to the pancreas well as well as a portion of the colon CT scan of the abdomen and pelvis shows mass in the pancreatic head consistent with renal cell carcinoma.  Findings discussed with patient, is to follow-up with his oncologist/GI as an outpatient   Leukocytosis ?? Leukemoid reaction Patient has leukocytosis but worse today and may be stress-induced No evidence of an acute infectious process at this time Will monitor   DVT prophylaxis: Heparin Code Status: Full code Family Communication: Plan of care discussed with patient and his wife at the bedside.  They verbalize understanding and agree with the plan Disposition Plan: Back to previous home environment Consults called: Cardiology    Lanora Reveron MD [facility]     10/12/2019, 12:33 PM

## 2019-10-12 NOTE — ED Notes (Signed)
Pt requesting food/water. Educated that he is to have nothing by mouth currently per EDP Siadecki verbal order.

## 2019-10-12 NOTE — ED Notes (Signed)
EDP Siadecki at bedside with lidocaine w/ epi. Pt pulled BP cuff off. Will collect covid swab once EDP done.

## 2019-10-12 NOTE — ED Notes (Signed)
Provider at bedside. Will check BG and give labetalol and heparin once provider finished.

## 2019-10-12 NOTE — ED Notes (Signed)
Introduced self to pt and family at bedside. Pt denies any needs currently. Given another warm blanket. Bed locked low. Rails up. Call bell within reach. Denies pain.

## 2019-10-12 NOTE — ED Notes (Signed)
Pt denies pain and nausea. Admitting provider at bedside.

## 2019-10-12 NOTE — ED Notes (Signed)
Pt's laceration bleeding at this time. 2x2 gauze and tape applied .bleeding controlled.

## 2019-10-12 NOTE — Consult Note (Signed)
CARDIOLOGY CONSULT NOTE               Patient ID: Daniel Bray MRN: [id number] DOB/AGE: 1935/03/28 85 y.o.  Admit date: 10/12/2019 Referring Physician Collier Bullock, MD Primary Physician Hortencia Pilar, MD Primary Cardiologist Mikle Bosworth, MD Reason for Consultation syncope and collapse  HPI: 85 year old male referred for evaluation of syncope and collapse. The patient has a history of coronary artery disease status post coronary stents and CABG x 3 in 2015, ischemic cardiomyopathy with recovery of EF to 50%, VT status post ablation, hypertension, hyperlipidemia, carotid artery disease with 50-69% stenosis bilateral ICA per carotid ultrasound in 04/2019, and renal cell carcinoma. The patient reports experiencing crampy abdominal discomfort and significant flatulence starting yesterday evening. In the morning, the patient had continued symptoms with vomiting and diarrhea. While having diarrhea on the toilet, the patient reports that he had brief prodromal lightheadedness, vision changes, and felt like he was going to pass out. He then awoke on the floor, and his wife came to his aid. The patient denies any chest pain, worsening shortness of breath, or palpitations. EMS was called, and the patient reportedly had recurrent syncope. In the ER, ECG revealed sinus rhythm with frequent PVCs with nonspecific ST-T wave abnormalities. Admission labs were notable for high sensitivity troponin 28 followed by 23, WBC 27K, creatinine 1.71, BUN 34. 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 2D echocardiogram in 2017 revealed LVEF 50% with global hypercontractility with trivial valvular insufficiencies. Currently, the patient denies chest pain, shortness of breath, palpitations, recent  peripheral edema, presyncope, lightheadedness, dizziness, but does complain of mild abdominal discomfort.      Review of systems complete and found to be negative unless listed above     Past Medical History:  Diagnosis Date  . Cancer ([place])    kidney  . Cancer ([place])    pancreas  . Cancer ([place])    colon   . Diabetes mellitus without complication ([place])   . Hyperlipidemia   . Hypertension   . Myocardial infarction ([place])   . Streptococcal infection    Strep Bovis  . Thyroid disease     Past Surgical History:  Procedure Laterality Date  . CARDIAC ELECTROPHYSIOLOGY STUDY AND ABLATION    . CARDIAC SURGERY    . CORONARY ANGIOPLASTY WITH STENT PLACEMENT    . CORONARY ARTERY BYPASS GRAFT     x3  . SPLENECTOMY      (Not in a hospital admission)  Social History   Socioeconomic History  . Marital status: Married    Spouse name: Not on file  . Number of children: Not on file  . Years of education: Not on file  . Highest education level: Not on file  Occupational History  . Not on file  Tobacco Use  . Smoking status: Current Every Day Smoker    Packs/day: 1.00    Types: Cigars  . Smokeless tobacco: Current User    Types: Chew  Substance and Sexual Activity  . Alcohol use: No  . Drug use: No  . Sexual activity: Not on file  Other Topics Concern  . Not on file  Social History Narrative  . Not on file   Social Determinants of Health   Financial Resource Strain:   . Difficulty of Paying Living Expenses: Not on  file  Food Insecurity:   . Worried About Charity fundraiser in the Last Year: Not on file  . Ran Out of Food in the Last Year: Not on file  Transportation Needs:   . Lack of Transportation (Medical): Not on file  . Lack of Transportation (Non-Medical): Not on file  Physical Activity:   . Days of Exercise per Week: Not on file  . Minutes of Exercise per Session: Not on file  Stress:   . Feeling of Stress : Not on file  Social Connections:   . Frequency  of Communication with Friends and Family: Not on file  . Frequency of Social Gatherings with Friends and Family: Not on file  . Attends Religious Services: Not on file  . Active Member of Clubs or Organizations: Not on file  . Attends Archivist Meetings: Not on file  . Marital Status: Not on file  Intimate Partner Violence:   . Fear of Current or Ex-Partner: Not on file  . Emotionally Abused: Not on file  . Physically Abused: Not on file  . Sexually Abused: Not on file    Family History  Family history unknown: Yes      Review of systems complete and found to be negative unless listed above      PHYSICAL EXAM  General: Well developed, well nourished, appears mildly uncomfortable, in no acute distress HEENT:  Normocephalic and atramatic Neck:  No JVD.  Lungs: Clear bilaterally to auscultation, normal effort of breathing on room air. Heart: HRRR . Normal S1 and S2 without gallops or murmurs.  Abdomen: not obviously distended Msk:  Back normal, gait not assessed. No obvious deformities. Extremities: No clubbing, cyanosis or edema.   Neuro: Alert and oriented X 3. Psych:  Good affect, responds appropriately  Labs:   Lab Results  Component Value Date   WBC 27.2 (H) 10/12/2019   HGB 15.8 10/12/2019   HCT 48.9 10/12/2019   MCV 103.4 (H) 10/12/2019   PLT 226 10/12/2019    Recent Labs  Lab 10/12/19 0739  NA 135  K 4.8  CL 101  CO2 24  BUN 34*  CREATININE 1.71*  CALCIUM 10.4*  PROT 7.8  BILITOT 1.0  ALKPHOS 61  ALT 37  AST 37  GLUCOSE 226*   Lab Results  Component Value Date   CKTOTAL 33 (L) 02/07/2015   TROPONINI 0.05 (HH) 10/03/2016   No results found for: CHOL No results found for: HDL No results found for: LDLCALC No results found for: TRIG No results found for: CHOLHDL No results found for: LDLDIRECT    Radiology: CT ABDOMEN PELVIS WO CONTRAST  Result Date: 10/12/2019 CLINICAL DATA:  Left upper quadrant pain elevated white blood cell  count EXAM: CT ABDOMEN AND PELVIS WITHOUT CONTRAST TECHNIQUE: Multidetector CT imaging of the abdomen and pelvis was performed following the standard protocol without IV contrast. COMPARISON:  05/12/2007 FINDINGS: Lower chest: Lung bases with basilar atelectasis. Coronary artery disease with calcification, three-vessel disease. Post median sternotomy. Heart is incompletely imaged. No signs of pericardial or pleural effusion. Hepatobiliary: Signs of hepatic cysts. Mildly lobular hepatic contours. No focal, suspicious hepatic lesion. Gallbladder is normal without biliary ductal distension. Pancreas: Mass lesion in the pancreatic head. Postoperative changes of subtotal pancreatectomy and splenectomy also associated with atrophy of the pancreas. Mass lesion measuring 3.6 x 3.2 cm. Spleen: Surgically absent. Adrenals/Urinary Tract: A portion of the left adrenal may remain in place following left nephrectomy. Right adrenal is normal. Scarring  of the right kidney, no signs of hydronephrosis. No significant perinephric stranding. Stomach/Bowel: Moderate gastric distension. No perigastric stranding. Small bowel with dilation in the left upper quadrant, dilated to approximately 3-1/2 cm transition occurring in the posterior aspect of the splenectomy bed. Distal small bowel loops are decompressed. Signs of subtotal colectomy with colonic anastomosis in the lower abdomen. Signs of colonic diverticulosis. Some gas and stool in the colon. Vascular/Lymphatic: Calcified atherosclerotic changes throughout the abdominal aorta. Postoperative changes of retroperitoneal lymph node dissection along the left periaortic chain. Reproductive: Prostate unremarkable by CT. Other: Fat containing small bilateral inguinal hernias. No signs of pelvic lymphadenopathy. Limited assessment of the pelvis due to streak artifact from hip arthroplasty changes on the right. Musculoskeletal: Post median sternotomy. No acute bone process. Spinal degenerative  changes. Right hip arthroplasty. IMPRESSION: 1. Partial small bowel obstruction or ileus with gradual transition occurring in the posterior aspect of the splenectomy bed. 2. 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 3. Signs of subtotal colectomy with small bowel to colonic anastomosis in the lower abdomen. 4. Fat containing bilateral inguinal hernias. 5. Coronary artery disease. Aortic Atherosclerosis (ICD10-I70.0). Electronically Signed   By: Zetta Bills M.D.   On: 10/12/2019 11:09   CT Head Wo Contrast  Result Date: 10/12/2019 CLINICAL DATA:  Syncope, fall, head injury EXAM: CT HEAD WITHOUT CONTRAST CT CERVICAL SPINE WITHOUT CONTRAST TECHNIQUE: Multidetector CT imaging of the head and cervical spine was performed following the standard protocol without intravenous contrast. Multiplanar CT image reconstructions of the cervical spine were also generated. COMPARISON:  08/13/2016 FINDINGS: CT HEAD FINDINGS Brain: No evidence of acute infarction, hemorrhage, hydrocephalus, extra-axial collection or mass lesion/mass effect. Mild subcortical white matter and periventricular small vessel ischemic changes. Vascular: Mild intracranial atherosclerosis. Skull: Normal. Negative for fracture or focal lesion. Sinuses/Orbits: Partial opacification of the right ethmoid air cells. Visualized paranasal sinuses and mastoid air cells are otherwise clear. Other: None. CT CERVICAL SPINE FINDINGS Alignment: Straightening of the cervical spine. Skull base and vertebrae: No acute fracture. No primary bone lesion or focal pathologic process. Soft tissues and spinal canal: No prevertebral fluid or swelling. No visible canal hematoma. Disc levels: Moderate degenerative changes of the mid/lower cervical spine. Spinal canal is patent. Upper chest: Visualized lung apices are clear. Other: Visualized thyroid is  unremarkable. IMPRESSION: No evidence of acute intracranial abnormality. Atrophy with small vessel ischemic changes. No evidence of traumatic injury to the cervical spine. Moderate degenerative changes. Electronically Signed   By: Julian Hy M.D.   On: 10/12/2019 08:57   CT Cervical Spine Wo Contrast  Result Date: 10/12/2019 CLINICAL DATA:  Syncope, fall, head injury EXAM: CT HEAD WITHOUT CONTRAST CT CERVICAL SPINE WITHOUT CONTRAST TECHNIQUE: Multidetector CT imaging of the head and cervical spine was performed following the standard protocol without intravenous contrast. Multiplanar CT image reconstructions of the cervical spine were also generated. COMPARISON:  08/13/2016 FINDINGS: CT HEAD FINDINGS Brain: No evidence of acute infarction, hemorrhage, hydrocephalus, extra-axial collection or mass lesion/mass effect. Mild subcortical white matter and periventricular small vessel ischemic changes. Vascular: Mild cranial atherosclerosis. Skull: Normal. Negative for fracture or focal lesion. Sinuses/Orbits: Partial opacification of the right ethmoid air cells. Visualized paranasal sinuses and mastoid air cells are otherwise clear. Other: None. CT CERVICAL SPINE FINDINGS Alignment: Straightening of the cervical spine. Skull base and vertebrae: No acute fracture. No primary bone lesion or focal pathologic process.  Soft tissues and spinal canal: No prevertebral fluid or swelling. No visible canal hematoma. Disc levels: Moderate degenerative changes of the mid/lower cervical spine. Spinal canal is patent. Upper chest: Visualized lung apices are clear. Other: Visualized thyroid is unremarkable. IMPRESSION: No evidence of acute intracranial abnormality. Atrophy with small vessel ischemic changes. No evidence of traumatic injury to the cervical spine. Moderate degenerative changes. Electronically Signed   By: Julian Hy M.D.   On: 10/12/2019 09:11    EKG: sinus rhythm, nonspecific ST-T wave abnormalities  with frequent PVCs  ASSESSMENT AND PLAN:  1. Syncope and collapse, likely vasovagal in nature in the setting of abdominal pain, vomiting, diarrhea, and small bowel obstructions with leukocytosis of 27,000. Prodromal lightheadedness and vision changes without chest pain, shortness of breath, or palpitations. 2. Small bowel obstruction or ileus with leukocytosis 3. Coronary artery disease, with history of 2 coronary stents and status post CABG x 3 in 2015, without recent chest pain, with nonspecific ST-T wave abnormalities on ECG without acute ischemia, with flat troponin without delta. 4. Ischemic cardiomyopathy, improved to 50%, currently on carvedilol and lisinopril as outpatient. 5. Hypertension, history of poorly controlled hypertension and medication intolerances. Was on hydralazine and amlodipine as outpatient, but those were reportedly discontinued recently. Blood pressure adequately controlled at this time. 6. Carotid artery disease with 50-69% stenosis bilateral ICA per carotid ultrasound in 04/2019, currently on Crestor and aspirin as outpatient  Plan: 1. Agree with current therapy 2. Obtain 2D echocardiogram 3. Continue to monitor on telemetry 4. Recommend resuming home carvedilol, lisinopril, aspirin, and Crestor 5. Recommend deferring repeat carotid ultrasound as patient recently had one in 04/2019. 6. Management of SBO per surgical team 7. Further recommendations pending patient's initial course  Signed: Sharolyn Douglas 10/12/2019, 5:12 PM

## 2019-10-12 NOTE — ED Notes (Signed)
MD at bedside. 

## 2019-10-12 NOTE — Consult Note (Signed)
[facility] SURGICAL CONSULTATION NOTE (initial) - [id number]   HISTORY OF PRESENT ILLNESS (HPI):  85 y.o. male presented to [facility] ED today for evaluation following a reported episode of syncope. Patient reports that he was on the toilet this morning and felt clammy and weak. He next thing he knew he had passed out and woke up on the bathroom floor. He believes he hit his head. He reports that prior to using the bathroom he noticed the acute onset of crampy abdominal pain and diarrhea. Once he arrived in the ED he noted resolution of his abdominal pain but he did have another syncopal episode. No history of similar in the past. Previous abdominal surgeries including splenectomy, left nephrectomy, and partial pancreatectomy. Work up in the ED revealed significant leukocytosis to 27k although history of splenectomy likely contributing to this, stable CKD, and SBO and pancreatic mass on CT Scan. He was admitted to medicine service.   Surgery is consulted by emergency medicine physician Dr. Cherylann Banas, MD in this context for evaluation and management of possible small bowel obstruction.   PAST MEDICAL HISTORY (PMH):  Past Medical History:  Diagnosis Date  . Cancer ([place])    kidney  . Cancer ([place])    pancreas  . Cancer ([place])    colon   . Diabetes mellitus without complication ([place])   . Hyperlipidemia   . Hypertension   . Myocardial infarction ([place])   . Streptococcal infection    Strep Bovis  . Thyroid disease      PAST SURGICAL HISTORY ([place]):  Past Surgical History:  Procedure Laterality Date  . CARDIAC ELECTROPHYSIOLOGY STUDY AND ABLATION    . CARDIAC SURGERY    . CORONARY ANGIOPLASTY WITH STENT PLACEMENT    . CORONARY ARTERY BYPASS GRAFT     x3  . SPLENECTOMY       MEDICATIONS:  Prior to Admission medications   Medication Sig Start Date End Date Taking? Authorizing Provider  acetaminophen (TYLENOL) 650 MG CR tablet Take 650 mg by mouth 2 (two) times daily.   Yes  [provider]  ALPRAZolam (XANAX) 0.25 MG tablet Take 0.125 mg by mouth at bedtime as needed for sleep.    Yes [provider]  aspirin EC 81 MG tablet Take 81 mg by mouth daily.   Yes [provider]  cholecalciferol (VITAMIN D) 1000 units tablet Take 2,500 Units by mouth daily.    Yes [provider]  DM-APAP-CPM (CORICIDIN HBP) 10-325-2 MG TABS Take 10 mg by mouth 2 (two) times daily.   Yes [provider]  Dulaglutide (TRULICITY) [id number] 0000000 SOPN Inject 0.75 mg into the skin once a week.   Yes [provider]  empagliflozin (JARDIANCE) 10 MG TABS tablet Take 10 mg by mouth daily.   Yes [provider]  gabapentin (NEURONTIN) 300 MG capsule Take 300 mg by mouth at bedtime.   Yes [provider]  hydrALAZINE (APRESOLINE) 25 MG tablet Take 25 mg by mouth 2 (two) times daily.   Yes [provider]  omeprazole (PRILOSEC) 40 MG capsule Take 20 mg by mouth daily.    Yes [provider]  pyridoxine (B-6) 100 MG tablet Take 100 mg by mouth daily.   Yes [provider]  rosuvastatin (CRESTOR) 40 MG tablet Take 40 mg by mouth daily.   Yes [provider]  glipiZIDE (GLUCOTROL) 5 MG tablet Take 10 mg by mouth 2 (two) times daily with a meal.  [provider]  levothyroxine (SYNTHROID) 75 MCG tablet Take 75 mcg by mouth daily before breakfast.     [provider]  lisinopril (PRINIVIL,ZESTRIL) 5 MG tablet Take 20 mg by mouth 2 (two) times daily.     [provider]     ALLERGIES:  Allergies  Allergen Reactions  . Levaquin [Levofloxacin In D5w]     Weakness, low blood pressure  . Januvia [Sitagliptin] Rash     SOCIAL HISTORY:  Social History   Socioeconomic History  . Marital status: Married    Spouse name: Not on file  . Number of children: Not on file  . Years of education: Not on file  . Highest education level: Not on file  Occupational History  .  Not on file  Tobacco Use  . Smoking status: Current Every Day Smoker    Packs/day: 1.00    Types: Cigars  . Smokeless tobacco: Current User    Types: Chew  Substance and Sexual Activity  . Alcohol use: No  . Drug use: No  . Sexual activity: Not on file  Other Topics Concern  . Not on file  Social History Narrative  . Not on file   Social Determinants of Health   Financial Resource Strain:   . Difficulty of Paying Living Expenses: Not on file  Food Insecurity:   . Worried About Charity fundraiser in the Last Year: Not on file  . Ran Out of Food in the Last Year: Not on file  Transportation Needs:   . Lack of Transportation (Medical): Not on file  . Lack of Transportation (Non-Medical): Not on file  Physical Activity:   . Days of Exercise per Week: Not on file  . Minutes of Exercise per Session: Not on file  Stress:   . Feeling of Stress : Not on file  Social Connections:   . Frequency of Communication with Friends and Family: Not on file  . Frequency of Social Gatherings with Friends and Family: Not on file  . Attends Religious Services: Not on file  . Active Member of Clubs or Organizations: Not on file  . Attends Archivist Meetings: Not on file  . Marital Status: Not on file  Intimate Partner Violence:   . Fear of Current or Ex-Partner: Not on file  . Emotionally Abused: Not on file  . Physically Abused: Not on file  . Sexually Abused: Not on file     FAMILY HISTORY:  Family History  Family history unknown: Yes      REVIEW OF SYSTEMS:  Review of Systems  Constitutional: Negative for chills and fever.  HENT: Negative for congestion and sore throat.   Respiratory: Negative for cough and shortness of breath.   Cardiovascular: Negative for chest pain and palpitations.  Gastrointestinal: Positive for abdominal pain, diarrhea and nausea. Negative for blood in stool, constipation and vomiting.  Genitourinary: Negative for dysuria and urgency.  All  other systems reviewed and are negative.   VITAL SIGNS:  Temp:  [97.6 F (36.4 C)] 97.6 F (36.4 C) (03/09 0738) Pulse Rate:  [62-87] 84 (03/09 1313) Resp:  [10-25] 21 (03/09 1313) BP: (143-157)/(65-96) 155/91 (03/09 1313) SpO2:  [89 %-100 %] 98 % (03/09 1313) Weight:  [70.3 kg] 70.3 kg (03/09 0740)     Height: 5' 7.5" (171.5 cm) Weight: 70.3 kg BMI (Calculated): 23.9   INTAKE/OUTPUT:  No intake/output data recorded.  PHYSICAL EXAM:  Physical Exam Vitals and nursing note reviewed.  Constitutional:  General: He is not in acute distress.    Appearance: Normal appearance. He is normal weight. He is not ill-appearing.  HENT:     Head: Normocephalic and atraumatic.  Eyes:     General: No scleral icterus.    Conjunctiva/sclera: Conjunctivae normal.  Cardiovascular:     Rate and Rhythm: Normal rate and regular rhythm.     Pulses: Normal pulses.     Heart sounds: No murmur.  Pulmonary:     Effort: Pulmonary effort is normal. No respiratory distress.     Breath sounds: Normal breath sounds.  Abdominal:     General: Abdomen is flat. A surgical scar is present. There is no distension.     Palpations: Abdomen is soft.     Tenderness: There is no abdominal tenderness. There is no guarding or rebound.     Comments: Multiple previous surgical scars including previous splenectomy, non-tender, non-distended, tympany to percussion in LUQ, no peritonitis   Genitourinary:    Comments: Deferred Musculoskeletal:     Right lower leg: No edema.     Left lower leg: No edema.  Skin:    General: Skin is warm and dry.     Coloration: Skin is not pale.     Findings: No erythema.  Neurological:     General: No focal deficit present.     Mental Status: He is alert and oriented to person, place, and time.  Psychiatric:        Mood and Affect: Mood normal.        Behavior: Behavior normal.      Labs:  CBC Latest Ref Rng & Units 10/12/2019 09/07/2019 04/23/2018  WBC 4.0 - 10.5 K/uL 27.2(H)  18.3(H) 16.2(H)  Hemoglobin 13.0 - 17.0 g/dL 15.8 14.3 15.2  Hematocrit 39.0 - 52.0 % 48.9 45.1 45.2  Platelets 150 - 400 K/uL 226 245 211   CMP Latest Ref Rng & Units 10/12/2019 09/07/2019 04/23/2018  Glucose 70 - 99 mg/dL 226(H) 165(H) 130(H)  BUN 8 - 23 mg/dL 34(H) 44(H) 47(H)  Creatinine 0.61 - 1.24 mg/dL 1.71(H) 1.88(H) 2.00(H)  Sodium 135 - 145 mmol/L 135 136 135  Potassium 3.5 - 5.1 mmol/L 4.8 4.2 4.8  Chloride 98 - 111 mmol/L 101 104 109  CO2 22 - 32 mmol/L 24 24 20(L)  Calcium 8.9 - 10.3 mg/dL 10.4(H) 10.4(H) 10.5(H)  Total Protein 6.5 - 8.1 g/dL 7.8 - 7.5  Total Bilirubin 0.3 - 1.2 mg/dL 1.0 - 0.7  Alkaline Phos 38 - 126 U/L 61 - 62  AST 15 - 41 U/L 37 - 21  ALT 0 - 44 U/L 37 - 26     Imaging studies:   CT Abdomen/Pelvis (10/12/2019) personally reviewed showing small bowel dilation primarily near previous splenectomy, and radiologist report reviewed:  IMPRESSION: 1. Partial small bowel obstruction or ileus with gradual transition occurring in the posterior aspect of the splenectomy bed. 2. 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 3. Signs of subtotal colectomy with small bowel to colonic anastomosis in the lower abdomen. 4. Fat containing bilateral inguinal hernias. 5. Coronary artery disease.    Assessment/Plan: (ICD-10's: K61.609) 85 y.o. male with syncopal episode following crampy abdominal pain and nausea which may be attributable to possible SBO from post-surgical adhesions -vs- possible gastroenteritis given diarrhea and nausea.   - Admit to medicine service  - Agree with holding off on NGT for  no given no nausea/emesis/pain. He understands if these were to develop then we would recommend NGT placement  - No surgical intervention. He should be referred back to his surgeon at [facility] at discharge for evaluation of this pancreatic mass  - NPO +  IVF for now  - pain control prn; antiemetics prn    - monitor abdominal pain; on-going bowel function  - can consider serial KUBs to assess for improvement if needed  - mobilization encouraged  - further management per primary service   All of the above findings and recommendations were discussed with the patient and his wife, and all of patient's questions were answered to his expressed satisfaction.  Thank you for the opportunity to participate in this patient's care.   -- Edison Simon, PA-C [facility] 10/12/2019, 1:24 PM [phone] M-F: 7am - 4pm

## 2019-10-12 NOTE — ED Provider Notes (Signed)
[facility] Emergency Department Provider Note ____________________________________________   First MD Initiated Contact with Patient 10/12/19 [phone]     (approximate)  I have reviewed the triage vital signs and the nursing notes.   HISTORY  Chief Complaint Fall and Loss of Consciousness    HPI Daniel Bray is a 85 y.o. male with PMH as noted below who presents with an episode of syncope, acute onset this morning when the patient was on the toilet.  Patient states around 4:30 AM he woke up with crampy abdominal pain and diarrhea.  He states when he was on the toilet around 7, he felt clammy and weak, and then passed out, causing him to hit his head.  Per EMS he passed out a second time while with them.  Patient denies any abdominal pain currently but does still have some nausea.  He denies any blood in stool.  Past Medical History:  Diagnosis Date  . Cancer ([place])    kidney  . Cancer ([place])    pancreas  . Cancer ([place])    colon   . Diabetes mellitus without complication ([place])   . Hyperlipidemia   . Hypertension   . Myocardial infarction ([place])   . Streptococcal infection    Strep Bovis  . Thyroid disease     Patient Active Problem List   Diagnosis Date Noted  . Syncope and collapse 10/12/2019  . Type 2 diabetes mellitus with stage 3 chronic kidney disease ([place]) 10/12/2019  . Essential hypertension 10/12/2019  . History of renal cell carcinoma 10/12/2019  . SBO (small bowel obstruction) ([place]) 10/12/2019  . Hypothyroidism 10/12/2019    Past Surgical History:  Procedure Laterality Date  . CARDIAC ELECTROPHYSIOLOGY STUDY AND ABLATION    . CARDIAC SURGERY    . CORONARY ANGIOPLASTY WITH STENT PLACEMENT    . CORONARY ARTERY BYPASS GRAFT     x3  . SPLENECTOMY      Prior to Admission medications   Medication Sig Start Date End Date Taking? Authorizing Provider  acetaminophen (TYLENOL) 650 MG CR tablet Take 650 mg by mouth 2 (two) times daily.    Yes [provider]  ALPRAZolam (XANAX) 0.25 MG tablet Take 0.125 mg by mouth at bedtime as needed for sleep.    Yes [provider]  aspirin EC 81 MG tablet Take 81 mg by mouth daily.   Yes [provider]  cholecalciferol (VITAMIN D) 1000 units tablet Take 2,500 Units by mouth daily.    Yes [provider]  DM-APAP-CPM (CORICIDIN HBP) 10-325-2 MG TABS Take 10 mg by mouth 2 (two) times daily.   Yes [provider]  Dulaglutide (TRULICITY) [id number] [id number] SOPN Inject 0.75 mg into the skin once a week.   Yes [provider]  empagliflozin (JARDIANCE) 10 MG TABS tablet Take 10 mg by mouth daily.   Yes [provider]  gabapentin (NEURONTIN) 300 MG capsule Take 300 mg by mouth at bedtime.   Yes [provider]  hydrALAZINE (APRESOLINE) 25 MG tablet Take 25 mg by mouth 2 (two) times daily.   Yes [provider]  omeprazole (PRILOSEC) 40 MG capsule Take 20 mg by mouth daily.    Yes [provider]  pyridoxine (B-6) 100 MG tablet Take 100 mg by mouth daily.   Yes [provider]  rosuvastatin (CRESTOR) 40 MG tablet Take 40 mg by mouth daily.   Yes [provider]  glipiZIDE (GLUCOTROL) 5 MG tablet Take 10 mg  by mouth 2 (two) times daily with a meal.     [provider]  levothyroxine (SYNTHROID) 75 MCG tablet Take 75 mcg by mouth daily before breakfast.     [provider]  lisinopril (PRINIVIL,ZESTRIL) 5 MG tablet Take 20 mg by mouth 2 (two) times daily.     [provider]    Allergies Levaquin [levofloxacin in d5w] and Januvia [sitagliptin]  Family History  Family history unknown: Yes    Social History Social History   Tobacco Use  . Smoking status: Current Every Day Smoker    Packs/day: 1.00    Types: Cigars  . Smokeless tobacco: Current User    Types: Chew  Substance Use Topics  . Alcohol use: No  . Drug use: No    Review of  Systems  Constitutional: No fever.  Positive for weakness. Eyes: No redness. ENT: No sore throat. Cardiovascular: Denies chest pain. Respiratory: Denies shortness of breath. Gastrointestinal: Positive for nausea, vomiting, diarrhea. Genitourinary: Negative for dysuria.  Musculoskeletal: Negative for back pain. Skin: Negative for rash. Neurological: Negative for headache.   ____________________________________________   PHYSICAL EXAM:  VITAL SIGNS: ED Triage Vitals  Enc Vitals Group     BP 10/12/19 0738 (!) 152/84     Pulse Rate 10/12/19 0738 62     Resp 10/12/19 0738 18     Temp 10/12/19 0738 97.6 F (36.4 C)     Temp src --      SpO2 10/12/19 0738 97 %     Weight 10/12/19 0740 155 lb (70.3 kg)     Height 10/12/19 0740 5' 7.5" (1.715 m)     Head Circumference --      Peak Flow --      Pain Score 10/12/19 0740 0     Pain Loc --      Pain Edu? --      Excl. in [place]? --     Constitutional: Alert and oriented.  Uncomfortable appearing but in no acute distress. Eyes: Conjunctivae are normal.  No scleral icterus. Head: 3 cm superficial laceration. Nose: No congestion/rhinnorhea. Mouth/Throat: Mucous membranes are moist.   Neck: Normal range of motion.  No midline cervical spinal tenderness. Cardiovascular: Normal rate, regular rhythm. Grossly normal heart sounds.  Good peripheral circulation. Respiratory: Normal respiratory effort.  No retractions. Lungs CTAB. Gastrointestinal: Soft and nontender. No distention.  Genitourinary: No flank tenderness. Musculoskeletal: No lower extremity edema.  Extremities warm and well perfused.  Neurologic:  Normal speech and language.  Motor and sensory intact in all extremities.  Normal coordination. Skin:  Skin is warm and dry. No rash noted. Psychiatric: Mood and affect are normal. Speech and behavior are normal.  ____________________________________________   LABS (all labs ordered are listed, but only abnormal results are  displayed)  Labs Reviewed  COMPREHENSIVE METABOLIC PANEL - Abnormal; Notable for the following components:      Result Value   Glucose, Bld 226 (*)    BUN 34 (*)    Creatinine, Ser 1.71 (*)    Calcium 10.4 (*)    GFR calc non Af Amer 36 (*)    GFR calc Af Amer 42 (*)    All other components within normal limits  CBC WITH DIFFERENTIAL/PLATELET - Abnormal; Notable for the following components:   WBC 27.2 (*)    MCV 103.4 (*)    Neutro Abs 18.0 (*)    Lymphs Abs 6.1 (*)    Monocytes Absolute 2.6 (*)    Abs  Immature Granulocytes 0.22 (*)    All other components within normal limits  URINALYSIS, COMPLETE (UACMP) WITH MICROSCOPIC - Abnormal; Notable for the following components:   Glucose, UA 500 (*)    Protein, ur 30 (*)    All other components within normal limits  TROPONIN I (HIGH SENSITIVITY) - Abnormal; Notable for the following components:   Troponin I (High Sensitivity) 28 (*)    All other components within normal limits  TROPONIN I (HIGH SENSITIVITY) - Abnormal; Notable for the following components:   Troponin I (High Sensitivity) 25 (*)    All other components within normal limits  RESPIRATORY PANEL BY RT PCR (FLU A&B, COVID)  LACTIC ACID, PLASMA  LACTIC ACID, PLASMA  LIPASE, BLOOD  CBC  HEMOGLOBIN A1C   ____________________________________________  EKG  ED ECG REPORT I, Arta Silence, the attending physician, personally viewed and interpreted this ECG.  Date: 10/12/2019 EKG Time: 0739 Rate: 65 Rhythm: normal sinus rhythm frequent PVCs QRS Axis: normal Intervals: Incomplete RBBB ST/T Wave abnormalities: LVH with repolarization abnormality, nonspecific T wave abnormalities inferior and lateral Narrative Interpretation: Nonspecific abnormalities with no significant change when compared to EKG of 09/07/2019; no evidence of acute  ischemia  ____________________________________________  RADIOLOGY    ____________________________________________   PROCEDURES  Procedure(s) performed: Yes   .Marland KitchenLaceration Repair  Date/Time: 10/12/2019 1:47 PM Performed by: Arta Silence, MD Authorized by: Arta Silence, MD   Consent:    Consent obtained:  Verbal   Consent given by:  Patient   Risks discussed:  Infection, pain, poor cosmetic result and retained foreign body   Alternatives discussed:  No treatment Anesthesia (see MAR for exact dosages):    Anesthesia method:  Local infiltration   Local anesthetic:  Lidocaine 2% WITH epi Laceration details:    Location:  Face   Face location:  Forehead   Length (cm):  3   Depth (mm):  3 Repair type:    Repair type:  Simple Exploration:    Hemostasis achieved with:  Direct pressure   Wound exploration: entire depth of wound probed and visualized     Wound extent: no fascia violation noted     Contaminated: no   Treatment:    Area cleansed with:  Betadine   Amount of cleaning:  Extensive   Irrigation method:  Syringe Skin repair:    Repair method:  Sutures   Suture size:  6-0   Suture material:  Nylon   Suture technique:  Simple interrupted   Number of sutures:  6 Approximation:    Approximation:  Close Post-procedure details:    Dressing:  Sterile dressing   Patient tolerance of procedure:  Tolerated well, no immediate complications    Critical Care performed: Yes  CRITICAL CARE Performed by: Arta Silence   Total critical care time: 30 minutes  Critical care time was exclusive of separately billable procedures and treating other patients.  Critical care was necessary to treat or prevent imminent or life-threatening deterioration.  Critical care was time spent personally by me on the following activities: development of treatment plan with patient and/or surrogate as well as nursing, discussions with consultants, evaluation of  patient's response to treatment, examination of patient, obtaining history from patient or surrogate, ordering and performing treatments and interventions, ordering and review of laboratory studies, ordering and review of radiographic studies, pulse oximetry and re-evaluation of patient's condition. ____________________________________________   INITIAL IMPRESSION / ASSESSMENT AND PLAN / ED COURSE  Pertinent labs & imaging results that were available during  my care of the patient were reviewed by me and considered in my medical decision making (see chart for details).  85 year old male with PMH as noted above including hypertension, diabetes, CAD, and cancer presents with syncope while on the toilet this morning after having diarrhea and crampy abdominal pain for several hours.  The patient did have a prodrome of feeling lightheaded and clammy.  The syncope resulted in a fall off the toilet and a laceration to the forehead.  I reviewed the past medical records in [place].  The patient was most recently seen in the ED for atypical chest pain with a negative work-up 1 month ago.  He has no recent admissions.  On exam, the patient is uncomfortable and slightly weak appearing but in no acute distress.  His vital signs are normal except for mild hypertension.  The abdomen is soft and nontender.  Neurologic exam is nonfocal.  The physical exam is otherwise unremarkable.  EKG shows LVH with repolarization abnormality and some nonspecific T wave findings with no significant change when compared with his most recent EKG, however the patient is having frequent PVCs around every 4 beats.  Overall presentation is most consistent with a vasovagal syncope related to the patient's diarrhea and abdominal cramping.  I suspect gastroenteritis.  However given the patient's history, differential includes ACS, gastroparesis, gastritis, or metabolic etiology.  We will give fluids, obtain a lab work-up, and reassess.  We  will also obtain a CT of the head and C-spine given the fall and repair the laceration.  ----------------------------------------- 1:42 PM on 10/12/2019 -----------------------------------------  CT of the head and C-spine were negative for acute traumatic findings.  I agree.  Laceration.  Patient was chronic leukocytosis, but his WBC count was more elevated than previously.  Because of the vomiting diarrhea and elevated WBC count, I obtained a CT of the abdomen which is concerning for partial small bowel obstruction.  At this time, the patient does not have further vomiting.  I sent a page to the general surgeon on-call for consultation, although he is currently in the operating room.  The patient will require admission, so I discussed his case with the hospitalist Dr. Francine Graven.  Since the patient is not currently vomiting anymore, I will hold off on an NG tube for now.  However, if he does have recurrent vomiting he will need one.  _________________________  Hubbard Robinson was evaluated in Emergency Department on 10/12/2019 for the symptoms described in the history of present illness. He was evaluated in the context of the global COVID-19 pandemic, which necessitated consideration that the patient might be at risk for infection with the SARS-CoV-2 virus that causes COVID-19. Institutional protocols and algorithms that pertain to the evaluation of patients at risk for COVID-19 are in a state of rapid change based on information released by regulatory bodies including the CDC and federal and state organizations. These policies and algorithms were followed during the patient's care in the ED.  ____________________________________________   FINAL CLINICAL IMPRESSION(S) / ED DIAGNOSES  Final diagnoses:  Partial small bowel obstruction (HCC)  Syncope, unspecified syncope type  Laceration of forehead, initial encounter      NEW MEDICATIONS STARTED DURING THIS VISIT:  New Prescriptions   No  medications on file     Note:  This document was prepared using Dragon voice recognition software and may include unintentional dictation errors.   Arta Silence, MD 10/12/19 1348

## 2019-10-12 NOTE — ED Notes (Signed)
Verbal okay from surgery to give pt "clears". Pt given cup of water. Delay on room assignment explained to wife.

## 2019-10-12 NOTE — ED Notes (Addendum)
Pt requesting food/drink again. Explained that surgery's note states to keep pt nothing by mouth for now. Pt given more warm blankets. Wife remains at bedside.

## 2019-10-12 NOTE — Progress Notes (Addendum)
Patient admitted to floor. Alert and oriented x4, no complaints of pain, nausea, emesis. No complaints of lightheaded or dizziness when standing up. Verified diet order with MD and General Surgery. Verbal orders from General Surgery to put patient on clear liquids. Orthostatics done on admission, box verified with [facility] NT, yellow socks on, bed in lowest position, call bell in reach.

## 2019-10-13 ENCOUNTER — Inpatient Hospital Stay
Admit: 2019-10-13 | Discharge: 2019-10-13 | Disposition: A | Discharge status: Home / Self Care | Payer: Medicare HMO | Attending: Internal Medicine | Admitting: Internal Medicine

## 2019-10-13 ENCOUNTER — Inpatient Hospital Stay: Payer: Medicare HMO

## 2019-10-13 DIAGNOSIS — Z85528 Personal history of other malignant neoplasm of kidney: Secondary | ICD-10-CM | POA: Diagnosis not present

## 2019-10-13 DIAGNOSIS — S0181XA Laceration without foreign body of other part of head, initial encounter: Secondary | ICD-10-CM | POA: Diagnosis not present

## 2019-10-13 DIAGNOSIS — R55 Syncope and collapse: Secondary | ICD-10-CM

## 2019-10-13 DIAGNOSIS — K56600 Partial intestinal obstruction, unspecified as to cause: Secondary | ICD-10-CM

## 2019-10-13 LAB — BASIC METABOLIC PANEL
Anion gap: 6 (ref 5–15)
BUN: 28 mg/dL — ABNORMAL HIGH (ref 8–23)
CO2: 23 mmol/L (ref 22–32)
Calcium: 8.9 mg/dL (ref 8.9–10.3)
Chloride: 106 mmol/L (ref 98–111)
Creatinine, Ser: 1.43 mg/dL — ABNORMAL HIGH (ref 0.61–1.24)
GFR calc Af Amer: 52 mL/min — ABNORMAL LOW (ref >60)
GFR calc non Af Amer: 45 mL/min — ABNORMAL LOW (ref >60)
Glucose, Bld: 190 mg/dL — ABNORMAL HIGH (ref 70–99)
Potassium: 4.6 mmol/L (ref 3.5–5.1)
Sodium: 135 mmol/L (ref 135–145)

## 2019-10-13 LAB — GLUCOSE, CAPILLARY
Glucose-Capillary: 152 mg/dL — ABNORMAL HIGH (ref 70–99)
Glucose-Capillary: 191 mg/dL — ABNORMAL HIGH (ref 70–99)
Glucose-Capillary: 153 mg/dL — ABNORMAL HIGH (ref 70–99)

## 2019-10-13 LAB — CBC
HCT: 40 % (ref 39.0–52.0)
Hemoglobin: 12.8 g/dL — ABNORMAL LOW (ref 13.0–17.0)
MCH: 32.8 pg (ref 26.0–34.0)
MCHC: 32 g/dL (ref 30.0–36.0)
MCV: 102.6 fL — ABNORMAL HIGH (ref 80.0–100.0)
Platelets: 188 10*3/uL (ref 150–400)
RBC: 3.9 MIL/uL — ABNORMAL LOW (ref 4.22–5.81)
RDW: 13.8 % (ref 11.5–15.5)
WBC: 26.2 10*3/uL — ABNORMAL HIGH (ref 4.0–10.5)
nRBC: 0 % (ref 0.0–0.2)

## 2019-10-13 LAB — PROTIME-INR
INR: 1.1 (ref 0.8–1.2)
Prothrombin Time: 14.1 seconds (ref 11.4–15.2)

## 2019-10-13 LAB — ECHOCARDIOGRAM COMPLETE
Height: 70 in
Weight: 2489.6 oz

## 2019-10-13 MED ORDER — SALINE SPRAY 0.65 % NA SOLN
1.0000 | NASAL | Status: DC | PRN
  Administered 2019-10-13: 1 via NASAL
  Filled 2019-10-13: qty 44

## 2019-10-13 MED ORDER — CARVEDILOL 6.25 MG PO TABS: 6.2500 mg | ORAL_TABLET | Freq: Two times a day (BID) | ORAL | 0 refills | Status: DC

## 2019-10-13 NOTE — Care Management Obs Status (Signed)
[facility] NOTIFICATION   Patient Details  Name: Daniel Bray MRN: [id number] Date of Birth: 10/06/1934   Medicare Observation Status Notification Given:  Yes    Gerrianne Scale Berdine Rasmusson, LCSW 10/13/2019, 2:47 PM

## 2019-10-13 NOTE — Progress Notes (Signed)
[facility] SURGICAL PROGRESS NOTE (cpt [phone])  Hospital Day(s): 1.   Interval History: Patient seen and examined, no acute events or new complaints overnight. Patient reports he is feeling better this morning. No abdominal pain, nausea,or emesis. Renal function improving, sCr - 1.43 this morning. Leukocytosis stable at 26.2, likely secondary to history of splenectomy, no fevers. Tolerated CLD, + Flatus.   Review of Systems:  Constitutional: denies fever, chills  HEENT: denies cough or congestion  Respiratory: denies any shortness of breath  Cardiovascular: denies chest pain or palpitations  Gastrointestinal: denies abdominal pain, N/V, or diarrhea/and bowel function as per interval history Genitourinary: denies burning with urination or urinary frequency   Vital signs in last 24 hours: [min-max] current  Temp:  [98.2 F (36.8 C)-99.1 F (37.3 C)] 98.3 F (36.8 C) (03/10 0738) Pulse Rate:  [62-96] 70 (03/10 0738) Resp:  [10-25] 19 (03/10 0738) BP: (89-165)/(58-96) 126/70 (03/10 0738) SpO2:  [95 %-100 %] 96 % (03/10 0738) Weight:  [69.7 kg-70.6 kg] 70.6 kg (03/10 0021)     Height: 5\' 10"  (177.8 cm) Weight: 70.6 kg BMI (Calculated): 22.33   Intake/Output last 2 shifts:  03/09 0701 - 03/10 0700 In: 1694 [I.V.:1194; IV Piggyback:500] Out: 425 [Urine:425]   Physical Exam:  Constitutional: alert, cooperative and no distress  HENT: normocephalic without obvious abnormality  Respiratory: breathing non-labored at rest  Cardiovascular: regular rate and sinus rhythm  Gastrointestinal: soft, non-tender, and non-distended, no rebound/guarding Musculoskeletal: no edema or wounds, motor and sensation grossly intact, NT    Labs:  CBC Latest Ref Rng & Units 10/13/2019 10/12/2019 09/07/2019  WBC 4.0 - 10.5 K/uL 26.2(H) 27.2(H) 18.3(H)  Hemoglobin 13.0 - 17.0 g/dL 12.8(L) 15.8 14.3  Hematocrit 39.0 - 52.0 % 40.0 48.9 45.1  Platelets 150 - 400 K/uL 188 226 245   CMP Latest Ref  Rng & Units 10/13/2019 10/12/2019 09/07/2019  Glucose 70 - 99 mg/dL 190(H) 226(H) 165(H)  BUN 8 - 23 mg/dL 28(H) 34(H) 44(H)  Creatinine 0.61 - 1.24 mg/dL 1.43(H) 1.71(H) 1.88(H)  Sodium 135 - 145 mmol/L 135 135 136  Potassium 3.5 - 5.1 mmol/L 4.6 4.8 4.2  Chloride 98 - 111 mmol/L 106 101 104  CO2 22 - 32 mmol/L 23 24 24   Calcium 8.9 - 10.3 mg/dL 8.9 10.4(H) 10.4(H)  Total Protein 6.5 - 8.1 g/dL - 7.8 -  Total Bilirubin 0.3 - 1.2 mg/dL - 1.0 -  Alkaline Phos 38 - 126 U/L - 61 -  AST 15 - 41 U/L - 37 -  ALT 0 - 44 U/L - 37 -     Imaging studies:   KUB (10/13/2019) personally reviewed improvement in small bowel dilation, and radiologist report pending     Assessment/Plan: (ICD-10's: K55.609) 85 y.o. male with return of bowel function attributable to possible partial SBO from post-surgical adhesions -vs- ileus -vs- possible gastroenteritis given diarrhea and nausea.   - ADAT  - No surgical intervention. He should be referred back to his surgeon at [facility] at discharge for evaluation of this pancreatic mass             - pain control prn; antiemetics prn               - monitor abdominal pain; on-going bowel function             - mobilization encouraged             - further management per primary service; no further surgical issues;  general surgery will sign off; discharge per pmedicine  All of the above findings and recommendations were discussed with the patient, and the medical team, and all of patient's questions were answered to his expressed satisfaction.  -- Edison Simon, PA-C [facility] 10/13/2019, 8:14 AM [phone] M-F: 7am - 4pm

## 2019-10-13 NOTE — Discharge Summary (Signed)
Physician Discharge Summary  PERCIVAL KURATA [id number] DOB: [birth year] DOA: 10/12/2019  PCP: Hortencia Pilar, MD  Admit date: 10/12/2019 Discharge date: 10/13/2019  Admitted From: Home Disposition: Home  Recommendations for Outpatient Follow-up:  1. Follow up with PCP in 1-2 weeks 2. Follow-up with your surgeon and oncologist at [facility]. 3. Please obtain BMP/CBC in one week 4. Please follow up on the following pending results: None  Home Health: No Equipment/Devices: None Discharge Condition: Stable CODE STATUS: Full Diet recommendation: Heart Healthy / Carb Modified    Brief/Interim Summary: TAIQUAN FRONDA is a 85 y.o. male with medical history significant for renal cell carcinoma s/p left kidney resection, s/p resection of a portion of the pancreas and colon , diabetes mellitus with complications of stage III chronic kidney disease, hypertension, coronary artery disease and dyslipidemia who presented to the emergency room after he had a syncopal episode.  He felt clammy and weak after having a bowel movement.  Having some abdominal discomfort and diarrhea.  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 Surgery was consulted and patient was managed conservatively, does not require any NG tube as he was not having any nausea or vomiting.  Symptoms improved and he was able to tolerate diet next day.  Patient was discharged home with instructions to follow-up with his oncologist and surgeon at [facility] for this possible recurrence of renal cell carcinoma. Patient also found to have leukocytosis which seems chronic.  He has an history of splenectomy, remained afebrile and no other sign of infection. His orthostatic vital signs were positive on admission which improved next day with IV fluid. Echocardiogram without any regional wall motion abnormalities, EF of 50 to 55% with grade 1 diastolic dysfunction.  Cardiology was  consulted and he can follow-up with them as an outpatient, and no need for further work-up at this time.  He will continue rest of his home meds.  Discharge Diagnoses:  Active Problems:   Syncope and collapse   Type 2 diabetes mellitus with stage 3 chronic kidney disease (HCC)   Essential hypertension   History of renal cell carcinoma   SBO (small bowel obstruction) (HCC)   Hypothyroidism  Discharge Instructions  Discharge Instructions    Diet - low sodium heart healthy   Complete by: As directed    Discharge instructions   Complete by: As directed    It was pleasure taking care of you. Please follow-up with your oncologist and surgeon at [facility] for this possible reoccurrence of your cancer. Keep yourself well-hydrated as your blood pressure was dropping before on admission with change in position and it has improved with some IV fluid which is an indication of dehydration.   Increase activity slowly   Complete by: As directed      Allergies as of 10/13/2019      Reactions   Levaquin [levofloxacin In D5w]    Weakness, low blood pressure   Januvia [sitagliptin] Rash      Medication List    TAKE these medications   acetaminophen 650 MG CR tablet Commonly known as: TYLENOL Take 650 mg by mouth 2 (two) times daily.   ALPRAZolam 0.25 MG tablet Commonly known as: XANAX Take 0.125 mg by mouth at bedtime as needed for sleep.   aspirin EC 81 MG tablet Take 81 mg by mouth daily.   carvedilol 6.25 MG tablet Commonly known as: COREG Take 1 tablet (6.25 mg total) by mouth  2 (two) times daily with a meal.   cholecalciferol 1000 units tablet Commonly known as: VITAMIN D Take 2,500 Units by mouth daily.   Coricidin HBP 10-325-2 MG Tabs Generic drug: DM-APAP-CPM Take 10 mg by mouth 2 (two) times daily.   gabapentin 300 MG capsule Commonly known as: NEURONTIN Take 300 mg by mouth at bedtime.   glipiZIDE 5 MG tablet Commonly known as: GLUCOTROL Take 10 mg by mouth 2 (two)  times daily with a meal.   hydrALAZINE 25 MG tablet Commonly known as: APRESOLINE Take 25 mg by mouth 2 (two) times daily.   Jardiance 10 MG Tabs tablet Generic drug: empagliflozin Take 10 mg by mouth daily.   levothyroxine 75 MCG tablet Commonly known as: SYNTHROID Take 75 mcg by mouth daily before breakfast.   lisinopril 5 MG tablet Commonly known as: ZESTRIL Take 20 mg by mouth 2 (two) times daily.   omeprazole 40 MG capsule Commonly known as: PRILOSEC Take 20 mg by mouth daily.   pyridoxine 100 MG tablet Commonly known as: B-6 Take 100 mg by mouth daily.   rosuvastatin 40 MG tablet Commonly known as: CRESTOR Take 40 mg by mouth daily.   Trulicity [id number] Sopn Generic drug: Dulaglutide Inject 0.75 mg into the skin once a week.       Allergies  Allergen Reactions  . Levaquin [Levofloxacin In D5w]     Weakness, low blood pressure  . Januvia [Sitagliptin] Rash    Consultations:  Cardiology  Surgery  Procedures/Studies: CT ABDOMEN PELVIS WO CONTRAST  Result Date: 10/12/2019 CLINICAL DATA:  Left upper quadrant pain elevated white blood cell count EXAM: CT ABDOMEN AND PELVIS WITHOUT CONTRAST TECHNIQUE: Multidetector CT imaging of the abdomen and pelvis was performed following the standard protocol without IV contrast. COMPARISON:  05/12/2007 FINDINGS: Lower chest: Lung bases with basilar atelectasis. Coronary artery disease with calcification, three-vessel disease. Post median sternotomy. Heart is incompletely imaged. No signs of pericardial or pleural effusion. Hepatobiliary: Signs of hepatic cysts. Mildly lobular hepatic contours. No focal, suspicious hepatic lesion. Gallbladder is normal without biliary ductal distension. Pancreas: Mass lesion in the pancreatic head. Postoperative changes of subtotal pancreatectomy and splenectomy also associated with atrophy of the pancreas. Mass lesion measuring 3.6 x 3.2 cm. Spleen: Surgically absent. Adrenals/Urinary  Tract: A portion of the left adrenal may remain in place following left nephrectomy. Right adrenal is normal. Scarring of the right kidney, no signs of hydronephrosis. No significant perinephric stranding. Stomach/Bowel: Moderate gastric distension. No perigastric stranding. Small bowel with dilation in the left upper quadrant, dilated to approximately 3-1/2 cm transition occurring in the posterior aspect of the splenectomy bed. Distal small bowel loops are decompressed. Signs of subtotal colectomy with colonic anastomosis in the lower abdomen. Signs of colonic diverticulosis. Some gas and stool in the colon. Vascular/Lymphatic: Calcified atherosclerotic changes throughout the abdominal aorta. Postoperative changes of retroperitoneal lymph node dissection along the left periaortic chain. Reproductive: Prostate unremarkable by CT. Other: Fat containing small bilateral inguinal hernias. No signs of pelvic lymphadenopathy. Limited assessment of the pelvis due to streak artifact from hip arthroplasty changes on the right. Musculoskeletal: Post median sternotomy. No acute bone process. Spinal degenerative changes. Right hip arthroplasty. IMPRESSION: 1. Partial small bowel obstruction or ileus with gradual transition occurring in the posterior aspect of the splenectomy bed. 2. 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 3. Signs of subtotal colectomy  with small bowel to colonic anastomosis in the lower abdomen. 4. Fat containing bilateral inguinal hernias. 5. Coronary artery disease. Aortic Atherosclerosis (ICD10-I70.0). Electronically Signed   By: Zetta Bills M.D.   On: 10/12/2019 11:09   CT Head Wo Contrast  Result Date: 10/12/2019 CLINICAL DATA:  Syncope, fall, head injury EXAM: CT HEAD WITHOUT CONTRAST CT CERVICAL SPINE WITHOUT CONTRAST TECHNIQUE: Multidetector CT imaging of the head and  cervical spine was performed following the standard protocol without intravenous contrast. Multiplanar CT image reconstructions of the cervical spine were also generated. COMPARISON:  08/13/2016 FINDINGS: CT HEAD FINDINGS Brain: No evidence of acute infarction, hemorrhage, hydrocephalus, extra-axial collection or mass lesion/mass effect. Mild subcortical white matter and periventricular small vessel ischemic changes. Vascular: Mild intracranial atherosclerosis. Skull: Normal. Negative for fracture or focal lesion. Sinuses/Orbits: Partial opacification of the right ethmoid air cells. Visualized paranasal sinuses and mastoid air cells are otherwise clear. Other: None. CT CERVICAL SPINE FINDINGS Alignment: Straightening of the cervical spine. Skull base and vertebrae: No acute fracture. No primary bone lesion or focal pathologic process. Soft tissues and spinal canal: No prevertebral fluid or swelling. No visible canal hematoma. Disc levels: Moderate degenerative changes of the mid/lower cervical spine. Spinal canal is patent. Upper chest: Visualized lung apices are clear. Other: Visualized thyroid is unremarkable. IMPRESSION: No evidence of acute intracranial abnormality. Atrophy with small vessel ischemic changes. No evidence of traumatic injury to the cervical spine. Moderate degenerative changes. Electronically Signed   By: Julian Hy M.D.   On: 10/12/2019 08:57   CT Cervical Spine Wo Contrast  Result Date: 10/12/2019 CLINICAL DATA:  Syncope, fall, head injury EXAM: CT HEAD WITHOUT CONTRAST CT CERVICAL SPINE WITHOUT CONTRAST TECHNIQUE: Multidetector CT imaging of the head and cervical spine was performed following the standard protocol without intravenous contrast. Multiplanar CT image reconstructions of the cervical spine were also generated. COMPARISON:  08/13/2016 FINDINGS: CT HEAD FINDINGS Brain: No evidence of acute infarction, hemorrhage, hydrocephalus, extra-axial collection or mass lesion/mass  effect. Mild subcortical white matter and periventricular small vessel ischemic changes. Vascular: Mild cranial atherosclerosis. Skull: Normal. Negative for fracture or focal lesion. Sinuses/Orbits: Partial opacification of the right ethmoid air cells. Visualized paranasal sinuses and mastoid air cells are otherwise clear. Other: None. CT CERVICAL SPINE FINDINGS Alignment: Straightening of the cervical spine. Skull base and vertebrae: No acute fracture. No primary bone lesion or focal pathologic process. Soft tissues and spinal canal: No prevertebral fluid or swelling. No visible canal hematoma. Disc levels: Moderate degenerative changes of the mid/lower cervical spine. Spinal canal is patent. Upper chest: Visualized lung apices are clear. Other: Visualized thyroid is unremarkable. IMPRESSION: No evidence of acute intracranial abnormality. Atrophy with small vessel ischemic changes. No evidence of traumatic injury to the cervical spine. Moderate degenerative changes. Electronically Signed   By: Julian Hy M.D.   On: 10/12/2019 09:11   DG Abd Portable 2V  Result Date: 10/13/2019 CLINICAL DATA:  Small bowel obstruction EXAM: PORTABLE ABDOMEN - 2 VIEW COMPARISON:  CT 10/12/2019 FINDINGS: Dilated small bowel loops in the left upper quadrant and mid to lower abdomen, stable since prior study. No free air or organomegaly. Extensive surgical clips in the abdomen. IMPRESSION: No significant change in the bowel-gas pattern since recent CT. Electronically Signed   By: Rolm Baptise M.D.   On: 10/13/2019 09:36   ECHOCARDIOGRAM COMPLETE  Result Date: 10/13/2019    ECHOCARDIOGRAM REPORT   Patient Name:   SELBY BRUECK Date of Exam: 10/13/2019 Medical Rec #:  [id number]          Height:       70.0 in Accession #:    [id number]         Weight:       155.6 lb Date of Birth:  [birth year]         BSA:          1.876 m Patient Age:    30 years           BP:           126/70 mmHg Patient Gender: M                  HR:            71 bpm. Exam Location:  [facility] Procedure: 2D Echo, Color Doppler and Cardiac Doppler Indications:     R55 Syncope  History:         Patient has no prior history of Echocardiogram examinations.                  Previous Myocardial Infarction, Prior CABG; Risk                  Factors:Hypertension, Diabetes and Dyslipidemia.  Sonographer:     Charmayne Sheer RDCS (AE) Referring Phys:  [id number] Royce Macadamia AGBATA Diagnosing Phys: Isaias Cowman MD IMPRESSIONS  1. Left ventricular ejection fraction, by estimation, is 50 to 55%. The left ventricle has low normal function. The left ventricle has no regional wall motion abnormalities. Left ventricular diastolic parameters are consistent with Grade I diastolic dysfunction (impaired relaxation).  2. Right ventricular systolic function is normal. The right ventricular size is normal.  3. The mitral valve is normal in structure. Trivial mitral valve regurgitation. No evidence of mitral stenosis.  4. The aortic valve is normal in structure. Aortic valve regurgitation is not visualized. No aortic stenosis is present.  5. The inferior vena cava is normal in size with greater than 50% respiratory variability, suggesting right atrial pressure of 3 mmHg. FINDINGS  Left Ventricle: Left ventricular ejection fraction, by estimation, is 50 to 55%. The left ventricle has low normal function. The left ventricle has no regional wall motion abnormalities. The left ventricular internal cavity size was normal in size. There is no left ventricular hypertrophy. Left ventricular diastolic parameters are consistent with Grade I diastolic dysfunction (impaired relaxation). Right Ventricle: The right ventricular size is normal. No increase in right ventricular wall thickness. Right ventricular systolic function is normal. Left Atrium: Left atrial size was normal in size. Right Atrium: Right atrial size was normal in size. Pericardium: There is no evidence of pericardial effusion. Mitral Valve:  The mitral valve is normal in structure. Normal mobility of the mitral valve leaflets. Trivial mitral valve regurgitation. No evidence of mitral valve stenosis. MV peak gradient, 3.6 mmHg. The mean mitral valve gradient is 1.0 mmHg. Tricuspid Valve: The tricuspid valve is normal in structure. Tricuspid valve regurgitation is trivial. No evidence of tricuspid stenosis. Aortic Valve: The aortic valve is normal in structure. Aortic valve regurgitation is not visualized. No aortic stenosis is present. Aortic valve mean gradient measures 3.0 mmHg. Aortic valve peak gradient measures 8.6 mmHg. Aortic valve area, by VTI measures 2.74 cm. Pulmonic Valve: The pulmonic valve was normal in structure. Pulmonic valve regurgitation is not visualized. No evidence of pulmonic stenosis. Aorta: The aortic root is normal in size and structure. Venous: The inferior vena cava is normal in size with greater than 50%  respiratory variability, suggesting right atrial pressure of 3 mmHg. IAS/Shunts: No atrial level shunt detected by color flow Doppler.  LEFT VENTRICLE PLAX 2D LVIDd:         4.22 cm  Diastology LVIDs:         3.15 cm  LV e' lateral:   9.36 cm/s LV PW:         1.26 cm  LV E/e' lateral: 6.2 LV IVS:        0.89 cm  LV e' medial:    7.72 cm/s LVOT diam:     2.20 cm  LV E/e' medial:  7.5 LV SV:         70 LV SV Index:   37 LVOT Area:     3.80 cm  RIGHT VENTRICLE RV Basal diam:  2.85 cm LEFT ATRIUM             Index       RIGHT ATRIUM           Index LA diam:        3.40 cm 1.81 cm/m  RA Area:     13.60 cm LA Vol (A2C):   43.1 ml 22.97 ml/m RA Volume:   33.90 ml  18.07 ml/m LA Vol (A4C):   43.2 ml 23.03 ml/m LA Biplane Vol: 45.2 ml 24.09 ml/m  AORTIC VALVE                   PULMONIC VALVE AV Area (Vmax):    2.35 cm    PV Vmax:       1.07 m/s AV Area (Vmean):   2.74 cm    PV Vmean:      70.700 cm/s AV Area (VTI):     2.74 cm    PV VTI:        0.199 m AV Vmax:           147.00 cm/s PV Peak grad:  4.6 mmHg AV Vmean:           81.100 cm/s PV Mean grad:  2.0 mmHg AV VTI:            0.254 m AV Peak Grad:      8.6 mmHg AV Mean Grad:      3.0 mmHg LVOT Vmax:         90.90 cm/s LVOT Vmean:        58.400 cm/s LVOT VTI:          0.183 m LVOT/AV VTI ratio: 0.72  AORTA Ao Root diam: 3.70 cm MITRAL VALVE MV Area (PHT): 3.03 cm    SHUNTS MV Peak grad:  3.6 mmHg    Systemic VTI:  0.18 m MV Mean grad:  1.0 mmHg    Systemic Diam: 2.20 cm MV Vmax:       0.95 m/s MV Vmean:      54.3 cm/s MV Decel Time: 250 msec MV E velocity: 58.20 cm/s MV A velocity: 80.00 cm/s MV E/A ratio:  0.73 Isaias Cowman MD Electronically signed by Isaias Cowman MD Signature Date/Time: 10/13/2019/1:46:23 PM    Final      Subjective: Patient was feeling better when seen this morning.  He was able to tolerate clear liquids well and wants to go home.  He was accompanied with his wife in the room.  Discharge Exam: Vitals:   10/13/19 1050 10/13/19 1138  BP: 116/69 134/71  Pulse: 74 (!) 58  Resp:  19  Temp:  98.3 F (36.8  C)  SpO2: 96% 95%   Vitals:   10/13/19 1046 10/13/19 1047 10/13/19 1050 10/13/19 1138  BP: 123/79 116/65 116/69 134/71  Pulse: 67 77 74 (!) 58  Resp:    19  Temp:    98.3 F (36.8 C)  TempSrc:    Oral  SpO2: 98% 98% 96% 95%  Weight:      Height:        General: Pt is alert, awake, not in acute distress Cardiovascular: RRR, S1/S2 +, no rubs, no gallops Respiratory: CTA bilaterally, no wheezing, no rhonchi Abdominal: Soft, NT, ND, bowel sounds + Extremities: no edema, no cyanosis   The results of significant diagnostics from this hospitalization (including imaging, microbiology, ancillary and laboratory) are listed below for reference.    Microbiology: Recent Results (from the past 240 hour(s))  Respiratory Panel by RT PCR (Flu A&B, Covid) - Nasopharyngeal Swab     Status: None   Collection Time: 10/12/19  1:10 PM   Specimen: Nasopharyngeal Swab  Result Value Ref Range Status   SARS Coronavirus 2 by RT PCR  NEGATIVE NEGATIVE Final    Comment: (NOTE) SARS-CoV-2 target nucleic acids are NOT DETECTED. The SARS-CoV-2 RNA is generally detectable in upper respiratoy specimens during the acute phase of infection. The lowest concentration of SARS-CoV-2 viral copies this assay can detect is 131 copies/mL. A negative result does not preclude SARS-Cov-2 infection and should not be used as the sole basis for treatment or other patient management decisions. A negative result may occur with  improper specimen collection/handling, submission of specimen other than nasopharyngeal swab, presence of viral mutation(s) within the areas targeted by this assay, and inadequate number of viral copies (<131 copies/mL). A negative result must be combined with clinical observations, patient history, and epidemiological information. The expected result is Negative. Fact Sheet for Patients:  PinkCheek.be Fact Sheet for Healthcare Providers:  GravelBags.it This test is not yet ap proved or cleared by the [place] FDA and  has been authorized for detection and/or diagnosis of SARS-CoV-2 by FDA under an Emergency Use Authorization (EUA). This EUA will remain  in effect (meaning this test can be used) for the duration of the COVID-19 declaration under Section 564(b)(1) of the Act, 21 U.S.C. section 360bbb-3(b)(1), unless the authorization is terminated or revoked sooner.    Influenza A by PCR NEGATIVE NEGATIVE Final   Influenza B by PCR NEGATIVE NEGATIVE Final    Comment: (NOTE) The Xpert Xpress SARS-CoV-2/FLU/RSV assay is intended as an aid in  the diagnosis of influenza from Nasopharyngeal swab specimens and  should not be used as a sole basis for treatment. Nasal washings and  aspirates are unacceptable for Xpert Xpress SARS-CoV-2/FLU/RSV  testing. Fact Sheet for Patients: PinkCheek.be Fact Sheet for Healthcare  Providers: GravelBags.it This test is not yet approved or cleared by the [place] FDA and  has been authorized for detection and/or diagnosis of SARS-CoV-2 by  FDA under an Emergency Use Authorization (EUA). This EUA will remain  in effect (meaning this test can be used) for the duration of the  Covid-19 declaration under Section 564(b)(1) of the Act, 21  U.S.C. section 360bbb-3(b)(1), unless the authorization is  terminated or revoked. Performed at [facility], [address] [place], [place] [address]      Labs: BNP (last 3 results) No results for input(s): BNP in the last 8760 hours. Basic Metabolic Panel: Recent Labs  Lab 10/12/19 0739 10/13/19 0439  NA 135 135  K 4.8 4.6  CL 101 106  CO2 24 23  GLUCOSE 226* 190*  BUN 34* 28*  CREATININE 1.71* 1.43*  CALCIUM 10.4* 8.9   Liver Function Tests: Recent Labs  Lab 10/12/19 0739  AST 37  ALT 37  ALKPHOS 61  BILITOT 1.0  PROT 7.8  ALBUMIN 4.4   Recent Labs  Lab 10/12/19 0739  LIPASE 15   No results for input(s): AMMONIA in the last 168 hours. CBC: Recent Labs  Lab 10/12/19 0739 10/13/19 0439  WBC 27.2* 26.2*  NEUTROABS 18.0*  --   HGB 15.8 12.8*  HCT 48.9 40.0  MCV 103.4* 102.6*  PLT 226 188   Cardiac Enzymes: No results for input(s): CKTOTAL, CKMB, CKMBINDEX, TROPONINI in the last 168 hours. BNP: Invalid input(s): POCBNP CBG: Recent Labs  Lab 10/12/19 1842 10/12/19 2103 10/13/19 0508 10/13/19 0740 10/13/19 1139  GLUCAP 173* 219* 152* 191* 153*   D-Dimer No results for input(s): DDIMER in the last 72 hours. Hgb A1c Recent Labs    10/12/19 0739  HGBA1C 7.6*   Lipid Profile No results for input(s): CHOL, HDL, LDLCALC, TRIG, CHOLHDL, LDLDIRECT in the last 72 hours. Thyroid function studies No results for input(s): TSH, T4TOTAL, T3FREE, THYROIDAB in the last 72 hours.  Invalid input(s): FREET3 Anemia work up No results for input(s):  VITAMINB12, FOLATE, FERRITIN, TIBC, IRON, RETICCTPCT in the last 72 hours. Urinalysis    Component Value Date/Time   COLORURINE YELLOW 10/12/2019 [place] 10/12/2019 0949   LABSPEC 1.015 10/12/2019 0949   PHURINE 6.0 10/12/2019 0949   GLUCOSEU 500 (A) 10/12/2019 0949   HGBUR NEGATIVE 10/12/2019 0949   BILIRUBINUR NEGATIVE 10/12/2019 0949   KETONESUR NEGATIVE 10/12/2019 0949   PROTEINUR 30 (A) 10/12/2019 0949   NITRITE NEGATIVE 10/12/2019 0949   LEUKOCYTESUR NEGATIVE 10/12/2019 0949   Sepsis Labs Invalid input(s): PROCALCITONIN,  WBC,  LACTICIDVEN Microbiology Recent Results (from the past 240 hour(s))  Respiratory Panel by RT PCR (Flu A&B, Covid) - Nasopharyngeal Swab     Status: None   Collection Time: 10/12/19  1:10 PM   Specimen: Nasopharyngeal Swab  Result Value Ref Range Status   SARS Coronavirus 2 by RT PCR NEGATIVE NEGATIVE Final    Comment: (NOTE) SARS-CoV-2 target nucleic acids are NOT DETECTED. The SARS-CoV-2 RNA is generally detectable in upper respiratoy specimens during the acute phase of infection. The lowest concentration of SARS-CoV-2 viral copies this assay can detect is 131 copies/mL. A negative result does not preclude SARS-Cov-2 infection and should not be used as the sole basis for treatment or other patient management decisions. A negative result may occur with  improper specimen collection/handling, submission of specimen other than nasopharyngeal swab, presence of viral mutation(s) within the areas targeted by this assay, and inadequate number of viral copies (<131 copies/mL). A negative result must be combined with clinical observations, patient history, and epidemiological information. The expected result is Negative. Fact Sheet for Patients:  PinkCheek.be Fact Sheet for Healthcare Providers:  GravelBags.it This test is not yet ap proved or cleared by the [place] FDA  and  has been authorized for detection and/or diagnosis of SARS-CoV-2 by FDA under an Emergency Use Authorization (EUA). This EUA will remain  in effect (meaning this test can be used) for the duration of the COVID-19 declaration under Section 564(b)(1) of the Act, 21 U.S.C. section 360bbb-3(b)(1), unless the authorization is terminated or revoked sooner.    Influenza A by PCR NEGATIVE NEGATIVE Final   Influenza B by PCR  NEGATIVE NEGATIVE Final    Comment: (NOTE) The Xpert Xpress SARS-CoV-2/FLU/RSV assay is intended as an aid in  the diagnosis of influenza from Nasopharyngeal swab specimens and  should not be used as a sole basis for treatment. Nasal washings and  aspirates are unacceptable for Xpert Xpress SARS-CoV-2/FLU/RSV  testing. Fact Sheet for Patients: PinkCheek.be Fact Sheet for Healthcare Providers: GravelBags.it This test is not yet approved or cleared by the [place] FDA and  has been authorized for detection and/or diagnosis of SARS-CoV-2 by  FDA under an Emergency Use Authorization (EUA). This EUA will remain  in effect (meaning this test can be used) for the duration of the  Covid-19 declaration under Section 564(b)(1) of the Act, 21  U.S.C. section 360bbb-3(b)(1), unless the authorization is  terminated or revoked. Performed at [facility], [address] [place], [place] [address]     Time coordinating discharge: Over 30 minutes  SIGNED:  Lorella Nimrod, MD  [facility] 10/13/2019, 2:26 PM  If 7PM-7AM, please contact night-coverage [email or web address]  This record has been created using Systems analyst. Errors have been sought and corrected,but may not always be located. Such creation errors do not reflect on the standard of care.

## 2019-10-13 NOTE — Progress Notes (Signed)
Patient possibly discharging after lunch if he tolerates soft diet per MD. Orthostatic vitals completed and MD notified of those numbers and in flowsheet.  Verbal orders to d/c fluids. Patient to has tolerated liquids all morning.

## 2019-10-13 NOTE — Care Management CC44 (Signed)
Condition Code 44 Documentation Completed  Patient Details  Name: Daniel Bray MRN: [id number] Date of Birth: 1934-08-16   Condition Code 44 given:  Yes Patient signature on Condition Code 44 notice:  Yes Documentation of 2 MD's agreement:  Yes Code 44 added to claim:  Yes    Gerrianne Scale Cambrie Sonnenfeld, LCSW 10/13/2019, 2:47 PM

## 2019-10-13 NOTE — Progress Notes (Signed)
Discharge instructions gone over with patient and wife in the room. Pt and wife verbalized understanding saying he was going to make follow up appointments with cardiology and his other doctors. IV taken out and tele monitor off. Patient discharged home in stable condition.

## 2019-10-13 NOTE — Progress Notes (Signed)
*  PRELIMINARY RESULTS* Echocardiogram 2D Echocardiogram has been performed.  Daniel Bray 10/13/2019, 9:34 AM

## 2019-10-13 NOTE — Plan of Care (Signed)
  Problem: Education: Goal: Knowledge of condition and prescribed therapy will improve Outcome: Adequate for Discharge   Problem: Cardiac: Goal: Will achieve and/or maintain adequate cardiac output Outcome: Adequate for Discharge   Problem: Physical Regulation: Goal: Complications related to the disease process, condition or treatment will be avoided or minimized Outcome: Adequate for Discharge   Problem: Education: Goal: Knowledge of General Education information will improve Description: Including pain rating scale, medication(s)/side effects and non-pharmacologic comfort measures Outcome: Adequate for Discharge   Problem: Health Behavior/Discharge Planning: Goal: Ability to manage health-related needs will improve Outcome: Adequate for Discharge   Problem: Clinical Measurements: Goal: Ability to maintain clinical measurements within normal limits will improve Outcome: Adequate for Discharge Goal: Will remain free from infection Outcome: Adequate for Discharge Goal: Diagnostic test results will improve Outcome: Adequate for Discharge Goal: Respiratory complications will improve Outcome: Adequate for Discharge Goal: Cardiovascular complication will be avoided Outcome: Adequate for Discharge   Problem: Activity: Goal: Risk for activity intolerance will decrease Outcome: Adequate for Discharge   Problem: Nutrition: Goal: Adequate nutrition will be maintained Outcome: Adequate for Discharge   Problem: Coping: Goal: Level of anxiety will decrease Outcome: Adequate for Discharge   Problem: Elimination: Goal: Will not experience complications related to bowel motility Outcome: Adequate for Discharge Goal: Will not experience complications related to urinary retention Outcome: Adequate for Discharge   Problem: Pain Managment: Goal: General experience of comfort will improve Outcome: Adequate for Discharge   Problem: Safety: Goal: Ability to remain free from injury  will improve Outcome: Adequate for Discharge   Problem: Skin Integrity: Goal: Risk for impaired skin integrity will decrease Outcome: Adequate for Discharge   

## 2019-10-16 ENCOUNTER — Emergency Department
Admission: EM | Admit: 2019-10-16 | Discharge: 2019-10-16 | Disposition: A | Discharge status: Home / Self Care | Payer: Medicare HMO | Attending: Emergency Medicine | Admitting: Emergency Medicine

## 2019-10-16 ENCOUNTER — Other Ambulatory Visit: Payer: Self-pay

## 2019-10-16 ENCOUNTER — Encounter: Payer: Self-pay | Admitting: Emergency Medicine

## 2019-10-16 DIAGNOSIS — I252 Old myocardial infarction: Secondary | ICD-10-CM | POA: Insufficient documentation

## 2019-10-16 DIAGNOSIS — Z4802 Encounter for removal of sutures: Secondary | ICD-10-CM | POA: Diagnosis not present

## 2019-10-16 DIAGNOSIS — E119 Type 2 diabetes mellitus without complications: Secondary | ICD-10-CM | POA: Diagnosis not present

## 2019-10-16 DIAGNOSIS — Z7984 Long term (current) use of oral hypoglycemic drugs: Secondary | ICD-10-CM | POA: Insufficient documentation

## 2019-10-16 DIAGNOSIS — Z85038 Personal history of other malignant neoplasm of large intestine: Secondary | ICD-10-CM | POA: Diagnosis not present

## 2019-10-16 DIAGNOSIS — S0181XD Laceration without foreign body of other part of head, subsequent encounter: Secondary | ICD-10-CM | POA: Insufficient documentation

## 2019-10-16 DIAGNOSIS — X58XXXD Exposure to other specified factors, subsequent encounter: Secondary | ICD-10-CM | POA: Diagnosis not present

## 2019-10-16 DIAGNOSIS — F17290 Nicotine dependence, other tobacco product, uncomplicated: Secondary | ICD-10-CM | POA: Diagnosis not present

## 2019-10-16 DIAGNOSIS — Z79899 Other long term (current) drug therapy: Secondary | ICD-10-CM | POA: Insufficient documentation

## 2019-10-16 DIAGNOSIS — I10 Essential (primary) hypertension: Secondary | ICD-10-CM | POA: Insufficient documentation

## 2019-10-16 DIAGNOSIS — E039 Hypothyroidism, unspecified: Secondary | ICD-10-CM | POA: Diagnosis not present

## 2019-10-16 DIAGNOSIS — Z955 Presence of coronary angioplasty implant and graft: Secondary | ICD-10-CM | POA: Insufficient documentation

## 2019-10-16 DIAGNOSIS — Z85528 Personal history of other malignant neoplasm of kidney: Secondary | ICD-10-CM | POA: Diagnosis not present

## 2019-10-16 DIAGNOSIS — Z8507 Personal history of malignant neoplasm of pancreas: Secondary | ICD-10-CM | POA: Insufficient documentation

## 2019-10-16 DIAGNOSIS — Z7982 Long term (current) use of aspirin: Secondary | ICD-10-CM | POA: Diagnosis not present

## 2019-10-16 NOTE — ED Notes (Signed)
See triage note. Pt ambulatory to room; steady. Pt has bandaide applied over site on forehead. Denies fever or any bleeding/symptoms of infection at site.

## 2019-10-16 NOTE — ED Provider Notes (Signed)
[facility] Emergency Department Provider Note   ____________________________________________   First MD Initiated Contact with Patient 10/16/19 1121     (approximate)  I have reviewed the triage vital signs and the nursing notes.   HISTORY  Chief Complaint Suture / Staple Removal    HPI Daniel Bray is a 85 y.o. male patient presents for suture removal from forehead secondary to laceration 5 days ago.  Patient voices no complaints.         Past Medical History:  Diagnosis Date  . Cancer ([place])    kidney  . Cancer ([place])    pancreas  . Cancer ([place])    colon   . Diabetes mellitus without complication ([place])   . Hyperlipidemia   . Hypertension   . Myocardial infarction ([place])   . Streptococcal infection    Strep Bovis  . Thyroid disease     Patient Active Problem List   Diagnosis Date Noted  . Laceration of forehead   . Syncope 10/12/2019  . Type 2 diabetes mellitus with stage 3 chronic kidney disease ([place]) 10/12/2019  . Essential hypertension 10/12/2019  . History of renal cell carcinoma 10/12/2019  . Partial small bowel obstruction ([place]) 10/12/2019  . Hypothyroidism 10/12/2019    Past Surgical History:  Procedure Laterality Date  . CARDIAC ELECTROPHYSIOLOGY STUDY AND ABLATION    . CARDIAC SURGERY    . CORONARY ANGIOPLASTY WITH STENT PLACEMENT    . CORONARY ARTERY BYPASS GRAFT     x3  . SPLENECTOMY      Prior to Admission medications   Medication Sig Start Date End Date Taking? Authorizing Provider  acetaminophen (TYLENOL) 650 MG CR tablet Take 650 mg by mouth 2 (two) times daily.    [provider]  ALPRAZolam Duanne Moron) 0.25 MG tablet Take 0.125 mg by mouth at bedtime as needed for sleep.     [provider]  aspirin EC 81 MG tablet Take 81 mg by mouth daily.    [provider]  carvedilol (COREG) 6.25 MG tablet Take 1 tablet (6.25 mg total) by mouth 2 (two) times daily with a meal. 10/13/19    Lorella Nimrod, MD  cholecalciferol (VITAMIN D) 1000 units tablet Take 2,500 Units by mouth daily.     [provider]  DM-APAP-CPM (CORICIDIN HBP) 10-325-2 MG TABS Take 10 mg by mouth 2 (two) times daily.    [provider]  Dulaglutide (TRULICITY) A999333 0000000 SOPN Inject 0.75 mg into the skin once a week.    [provider]  empagliflozin (JARDIANCE) 10 MG TABS tablet Take 10 mg by mouth daily.    [provider]  gabapentin (NEURONTIN) 300 MG capsule Take 300 mg by mouth at bedtime.    [provider]  glipiZIDE (GLUCOTROL) 5 MG tablet Take 10 mg by mouth 2 (two) times daily with a meal.     [provider]  hydrALAZINE (APRESOLINE) 25 MG tablet Take 25 mg by mouth 2 (two) times daily.    [provider]  levothyroxine (SYNTHROID) 75 MCG tablet Take 75 mcg by mouth daily before breakfast.     [provider]  lisinopril (PRINIVIL,ZESTRIL) 5 MG tablet Take 20 mg by mouth 2 (two) times daily.     [provider]  omeprazole (PRILOSEC) 40 MG capsule Take 20 mg by mouth daily.     [provider]  pyridoxine (B-6) 100 MG tablet Take 100 mg by mouth daily.    [provider]  rosuvastatin (CRESTOR) 40 MG tablet Take 40 mg by mouth daily.    [provider]    Allergies Levaquin [levofloxacin in d5w] and Januvia [sitagliptin]  Family History  Family history unknown: Yes    Social History Social History   Tobacco Use  . Smoking status: Current Every Day Smoker    Packs/day: 1.00    Types: Cigars  . Smokeless tobacco: Current User    Types: Chew  Substance Use Topics  . Alcohol use: No  . Drug use: No    Review of Systems Constitutional: No fever/chills Eyes: No visual changes. ENT: No sore throat. Cardiovascular: Denies chest pain. Respiratory: Denies shortness of breath. Gastrointestinal: No abdominal pain.  No nausea, no vomiting.  No diarrhea.  No constipation.  Genitourinary: Negative for dysuria. Musculoskeletal: Negative for back pain. Skin: Negative for rash.  Forehead laceration Neurological: Negative for headaches, focal weakness or numbness. Endocrine:  Diabetes, hyperlipidemia, hypertension, and hypothyroidism. Allergic/Immunilogical: Levaquin and Januvia  ____________________________________________   PHYSICAL EXAM:  VITAL SIGNS: ED Triage Vitals  Enc Vitals Group     BP 10/16/19 1115 137/82     Pulse Rate 10/16/19 1115 87     Resp 10/16/19 1115 16     Temp --      Temp src --      SpO2 10/16/19 1115 98 %     Weight 10/16/19 1106 155 lb 10.3 oz (70.6 kg)     Height 10/16/19 1106 5\' 10"  (1.778 m)     Head Circumference --      Peak Flow --      Pain Score --      Pain Loc --      Pain Edu? --      Excl. in [place]? --    Constitutional: Alert and oriented. Well appearing and in no acute distress. Cardiovascular: Normal rate, regular rhythm. Grossly normal heart sounds.  Good peripheral circulation. Respiratory: Normal respiratory effort.  No retractions. Lungs CTAB. Skin: Healed forehead laceration.   Psychiatric: Mood and affect are normal. Speech and behavior are normal.  ____________________________________________   LABS (all labs ordered are listed, but only abnormal results are displayed)  Labs Reviewed - No data to display ____________________________________________  EKG   ____________________________________________  RADIOLOGY  ED MD interpretation:    Official radiology report(s): No results found.  ____________________________________________   PROCEDURES  Procedure(s) performed (including Critical Care):  .Suture Removal  Date/Time: 10/16/2019 11:37 AM Performed by: Sable Feil, PA-C Authorized by: Sable Feil, PA-C   Consent:    Consent obtained:  Verbal   Consent given by:  Patient   Risks discussed:  Bleeding, pain and wound separation Location:    Location:  Head/neck    Head/neck location:  Forehead Procedure details:    Wound appearance:  No signs of infection, good wound healing and clean   Number of sutures removed:  6 Post-procedure details:    Post-removal:  Band-Aid applied   Patient tolerance of procedure:  Tolerated well, no immediate complications     ____________________________________________   INITIAL IMPRESSION / ASSESSMENT AND PLAN / ED COURSE  As part of my medical decision making, I reviewed the following data within the [facility]     Patient presents for suture over secondary to a healed laceration for him.  See procedure note.  Patient given discharge care instruction advised follow-up as necessary.    Daniel Bray was evaluated in Emergency Department on  10/16/2019 for the symptoms described in the history of present illness. He was evaluated in the context of the global COVID-19 pandemic, which necessitated consideration that the patient might be at risk for infection with the SARS-CoV-2 virus that causes COVID-19. Institutional protocols and algorithms that pertain to the evaluation of patients at risk for COVID-19 are in a state of rapid change based on information released by regulatory bodies including the CDC and federal and state organizations. These policies and algorithms were followed during the patient's care in the ED.       ____________________________________________   FINAL CLINICAL IMPRESSION(S) / ED DIAGNOSES  Final diagnoses:  Encounter for removal of sutures     ED Discharge Orders    None       Note:  This document was prepared using Dragon voice recognition software and may include unintentional dictation errors.    Sable Feil, PA-C 10/16/19 1137    Vanessa Kennerdell, MD 10/17/19 [phone]

## 2019-10-16 NOTE — Discharge Instructions (Addendum)
Follow discharge care instructions. 

## 2019-10-16 NOTE — ED Triage Notes (Signed)
Pt had sutures placed to forehead on Tuesday and was told to return in 4-5 days.

## 2020-01-24 ENCOUNTER — Other Ambulatory Visit: Payer: Self-pay

## 2020-01-24 ENCOUNTER — Observation Stay: Payer: Medicare HMO

## 2020-01-24 ENCOUNTER — Encounter: Payer: Self-pay | Admitting: Emergency Medicine

## 2020-01-24 ENCOUNTER — Observation Stay
Admission: EM | Admit: 2020-01-24 | Discharge: 2020-01-25 | Disposition: A | Discharge status: Home / Self Care | Payer: Medicare HMO | Attending: Internal Medicine | Admitting: Internal Medicine

## 2020-01-24 DIAGNOSIS — Z951 Presence of aortocoronary bypass graft: Secondary | ICD-10-CM | POA: Insufficient documentation

## 2020-01-24 DIAGNOSIS — N1832 Chronic kidney disease, stage 3b: Secondary | ICD-10-CM | POA: Diagnosis present

## 2020-01-24 DIAGNOSIS — W2201XA Walked into wall, initial encounter: Secondary | ICD-10-CM | POA: Diagnosis not present

## 2020-01-24 DIAGNOSIS — E785 Hyperlipidemia, unspecified: Secondary | ICD-10-CM | POA: Diagnosis not present

## 2020-01-24 DIAGNOSIS — D72829 Elevated white blood cell count, unspecified: Secondary | ICD-10-CM | POA: Diagnosis not present

## 2020-01-24 DIAGNOSIS — Z7982 Long term (current) use of aspirin: Secondary | ICD-10-CM | POA: Insufficient documentation

## 2020-01-24 DIAGNOSIS — Z85528 Personal history of other malignant neoplasm of kidney: Secondary | ICD-10-CM | POA: Diagnosis not present

## 2020-01-24 DIAGNOSIS — Z20822 Contact with and (suspected) exposure to covid-19: Secondary | ICD-10-CM | POA: Diagnosis not present

## 2020-01-24 DIAGNOSIS — Z79899 Other long term (current) drug therapy: Secondary | ICD-10-CM | POA: Insufficient documentation

## 2020-01-24 DIAGNOSIS — I6782 Cerebral ischemia: Secondary | ICD-10-CM | POA: Insufficient documentation

## 2020-01-24 DIAGNOSIS — R778 Other specified abnormalities of plasma proteins: Secondary | ICD-10-CM | POA: Diagnosis not present

## 2020-01-24 DIAGNOSIS — I2510 Atherosclerotic heart disease of native coronary artery without angina pectoris: Secondary | ICD-10-CM | POA: Diagnosis not present

## 2020-01-24 DIAGNOSIS — N1831 Chronic kidney disease, stage 3a: Secondary | ICD-10-CM

## 2020-01-24 DIAGNOSIS — Z9081 Acquired absence of spleen: Secondary | ICD-10-CM | POA: Insufficient documentation

## 2020-01-24 DIAGNOSIS — Z923 Personal history of irradiation: Secondary | ICD-10-CM | POA: Insufficient documentation

## 2020-01-24 DIAGNOSIS — E860 Dehydration: Secondary | ICD-10-CM | POA: Diagnosis not present

## 2020-01-24 DIAGNOSIS — K219 Gastro-esophageal reflux disease without esophagitis: Secondary | ICD-10-CM | POA: Diagnosis not present

## 2020-01-24 DIAGNOSIS — R001 Bradycardia, unspecified: Secondary | ICD-10-CM | POA: Diagnosis present

## 2020-01-24 DIAGNOSIS — E1122 Type 2 diabetes mellitus with diabetic chronic kidney disease: Secondary | ICD-10-CM | POA: Insufficient documentation

## 2020-01-24 DIAGNOSIS — R55 Syncope and collapse: Secondary | ICD-10-CM | POA: Diagnosis not present

## 2020-01-24 DIAGNOSIS — I10 Essential (primary) hypertension: Secondary | ICD-10-CM | POA: Diagnosis present

## 2020-01-24 DIAGNOSIS — Z955 Presence of coronary angioplasty implant and graft: Secondary | ICD-10-CM | POA: Insufficient documentation

## 2020-01-24 DIAGNOSIS — F17290 Nicotine dependence, other tobacco product, uncomplicated: Secondary | ICD-10-CM | POA: Diagnosis not present

## 2020-01-24 DIAGNOSIS — Z720 Tobacco use: Secondary | ICD-10-CM | POA: Diagnosis present

## 2020-01-24 DIAGNOSIS — I119 Hypertensive heart disease without heart failure: Secondary | ICD-10-CM | POA: Insufficient documentation

## 2020-01-24 DIAGNOSIS — N183 Chronic kidney disease, stage 3 unspecified: Secondary | ICD-10-CM | POA: Insufficient documentation

## 2020-01-24 DIAGNOSIS — F419 Anxiety disorder, unspecified: Secondary | ICD-10-CM | POA: Diagnosis present

## 2020-01-24 DIAGNOSIS — M6281 Muscle weakness (generalized): Secondary | ICD-10-CM | POA: Diagnosis not present

## 2020-01-24 DIAGNOSIS — I1310 Hypertensive heart and chronic kidney disease without heart failure, with stage 1 through stage 4 chronic kidney disease, or unspecified chronic kidney disease: Secondary | ICD-10-CM | POA: Diagnosis not present

## 2020-01-24 DIAGNOSIS — Z9049 Acquired absence of other specified parts of digestive tract: Secondary | ICD-10-CM | POA: Insufficient documentation

## 2020-01-24 DIAGNOSIS — R61 Generalized hyperhidrosis: Secondary | ICD-10-CM | POA: Diagnosis not present

## 2020-01-24 DIAGNOSIS — I252 Old myocardial infarction: Secondary | ICD-10-CM | POA: Diagnosis not present

## 2020-01-24 DIAGNOSIS — R7989 Other specified abnormal findings of blood chemistry: Secondary | ICD-10-CM | POA: Diagnosis present

## 2020-01-24 DIAGNOSIS — Z888 Allergy status to other drugs, medicaments and biological substances status: Secondary | ICD-10-CM | POA: Insufficient documentation

## 2020-01-24 DIAGNOSIS — Z7984 Long term (current) use of oral hypoglycemic drugs: Secondary | ICD-10-CM | POA: Insufficient documentation

## 2020-01-24 DIAGNOSIS — I440 Atrioventricular block, first degree: Secondary | ICD-10-CM | POA: Insufficient documentation

## 2020-01-24 DIAGNOSIS — E1169 Type 2 diabetes mellitus with other specified complication: Secondary | ICD-10-CM | POA: Diagnosis present

## 2020-01-24 DIAGNOSIS — E039 Hypothyroidism, unspecified: Secondary | ICD-10-CM | POA: Diagnosis present

## 2020-01-24 DIAGNOSIS — C259 Malignant neoplasm of pancreas, unspecified: Secondary | ICD-10-CM | POA: Insufficient documentation

## 2020-01-24 DIAGNOSIS — Z881 Allergy status to other antibiotic agents status: Secondary | ICD-10-CM | POA: Insufficient documentation

## 2020-01-24 LAB — COMPREHENSIVE METABOLIC PANEL
ALT: 31 U/L (ref 0–44)
AST: 25 U/L (ref 15–41)
Albumin: 3.6 g/dL (ref 3.5–5.0)
Alkaline Phosphatase: 59 U/L (ref 38–126)
Anion gap: 9 (ref 5–15)
BUN: 28 mg/dL — ABNORMAL HIGH (ref 8–23)
CO2: 22 mmol/L (ref 22–32)
Calcium: 10 mg/dL (ref 8.9–10.3)
Chloride: 106 mmol/L (ref 98–111)
Creatinine, Ser: 1.69 mg/dL — ABNORMAL HIGH (ref 0.61–1.24)
GFR calc Af Amer: 43 mL/min — ABNORMAL LOW (ref >60)
GFR calc non Af Amer: 37 mL/min — ABNORMAL LOW (ref >60)
Glucose, Bld: 236 mg/dL — ABNORMAL HIGH (ref 70–99)
Potassium: 4.5 mmol/L (ref 3.5–5.1)
Sodium: 137 mmol/L (ref 135–145)
Total Bilirubin: 0.7 mg/dL (ref 0.3–1.2)
Total Protein: 6.8 g/dL (ref 6.5–8.1)

## 2020-01-24 LAB — URINALYSIS, COMPLETE (UACMP) WITH MICROSCOPIC
Bacteria, UA: NONE SEEN
Bilirubin Urine: NEGATIVE
Glucose, UA: >=500 mg/dL — AB
Hgb urine dipstick: NEGATIVE
Ketones, ur: NEGATIVE mg/dL
Leukocytes,Ua: NEGATIVE
Nitrite: NEGATIVE
Protein, ur: NEGATIVE mg/dL
Specific Gravity, Urine: 1.014 (ref 1.005–1.030)
Squamous Epithelial / HPF: NONE SEEN (ref 0–5)
pH: 6 (ref 5.0–8.0)

## 2020-01-24 LAB — CBC WITH DIFFERENTIAL/PLATELET
Abs Immature Granulocytes: 0.04 10*3/uL (ref 0.00–0.07)
Basophils Absolute: 0.1 10*3/uL (ref 0.0–0.1)
Basophils Relative: 1 %
Eosinophils Absolute: 0.2 10*3/uL (ref 0.0–0.5)
Eosinophils Relative: 1 %
HCT: 42.5 % (ref 39.0–52.0)
Hemoglobin: 14 g/dL (ref 13.0–17.0)
Immature Granulocytes: 0 %
Lymphocytes Relative: 37 %
Lymphs Abs: 5.5 10*3/uL — ABNORMAL HIGH (ref 0.7–4.0)
MCH: 32.7 pg (ref 26.0–34.0)
MCHC: 32.9 g/dL (ref 30.0–36.0)
MCV: 99.3 fL (ref 80.0–100.0)
Monocytes Absolute: 1.5 10*3/uL — ABNORMAL HIGH (ref 0.1–1.0)
Monocytes Relative: 10 %
Neutro Abs: 7.6 10*3/uL (ref 1.7–7.7)
Neutrophils Relative %: 51 %
Platelets: 204 10*3/uL (ref 150–400)
RBC: 4.28 MIL/uL (ref 4.22–5.81)
RDW: 13.1 % (ref 11.5–15.5)
Smear Review: NORMAL
WBC: 15 10*3/uL — ABNORMAL HIGH (ref 4.0–10.5)
nRBC: 0 % (ref 0.0–0.2)

## 2020-01-24 LAB — SARS CORONAVIRUS 2 BY RT PCR (HOSPITAL ORDER, PERFORMED IN ~~LOC~~ HOSPITAL LAB): SARS Coronavirus 2: NEGATIVE

## 2020-01-24 LAB — GLUCOSE, CAPILLARY
Glucose-Capillary: 172 mg/dL — ABNORMAL HIGH (ref 70–99)
Glucose-Capillary: 191 mg/dL — ABNORMAL HIGH (ref 70–99)

## 2020-01-24 LAB — TROPONIN I (HIGH SENSITIVITY)
Troponin I (High Sensitivity): 39 ng/L — ABNORMAL HIGH (ref <18)
Troponin I (High Sensitivity): 29 ng/L — ABNORMAL HIGH (ref <18)
Troponin I (High Sensitivity): 34 ng/L — ABNORMAL HIGH (ref <18)
Troponin I (High Sensitivity): 38 ng/L — ABNORMAL HIGH (ref <18)

## 2020-01-24 LAB — MAGNESIUM: Magnesium: 1.9 mg/dL (ref 1.7–2.4)

## 2020-01-24 LAB — BRAIN NATRIURETIC PEPTIDE: B Natriuretic Peptide: 204.6 pg/mL — ABNORMAL HIGH (ref 0.0–100.0)

## 2020-01-24 MED ORDER — VITAMIN D 25 MCG (1000 UNIT) PO TABS
2500.0000 [IU] | ORAL_TABLET | Freq: Every day | ORAL | Status: DC
  Administered 2020-01-25: 2500 [IU] via ORAL
  Filled 2020-01-24: qty 3

## 2020-01-24 MED ORDER — ONDANSETRON HCL 4 MG/2ML IJ SOLN: 4.0000 mg | Freq: Three times a day (TID) | INTRAMUSCULAR | Status: DC | PRN

## 2020-01-24 MED ORDER — GABAPENTIN 300 MG PO CAPS: 300.0000 mg | ORAL_CAPSULE | Freq: Every day | ORAL | Status: DC

## 2020-01-24 MED ORDER — LISINOPRIL 10 MG PO TABS
20.0000 mg | ORAL_TABLET | Freq: Two times a day (BID) | ORAL | Status: DC
  Administered 2020-01-24 – 2020-01-25 (×2): 20 mg via ORAL
  Filled 2020-01-24 (×2): qty 2

## 2020-01-24 MED ORDER — GABAPENTIN 300 MG PO CAPS
300.0000 mg | ORAL_CAPSULE | Freq: Every day | ORAL | Status: DC
  Administered 2020-01-24: 300 mg via ORAL
  Filled 2020-01-24: qty 1

## 2020-01-24 MED ORDER — INSULIN ASPART 100 UNIT/ML ~~LOC~~ SOLN
0.0000 [IU] | Freq: Three times a day (TID) | SUBCUTANEOUS | Status: DC
  Administered 2020-01-24: 2 [IU] via SUBCUTANEOUS
  Filled 2020-01-24: qty 1

## 2020-01-24 MED ORDER — DM-APAP-CPM 10-325-2 MG PO TABS: 10.0000 mg | ORAL_TABLET | Freq: Two times a day (BID) | ORAL | Status: DC

## 2020-01-24 MED ORDER — LEVOTHYROXINE SODIUM 50 MCG PO TABS
75.0000 ug | ORAL_TABLET | Freq: Every day | ORAL | Status: DC
  Administered 2020-01-25: 75 ug via ORAL
  Filled 2020-01-24: qty 2

## 2020-01-24 MED ORDER — NICOTINE 21 MG/24HR TD PT24
21.0000 mg | MEDICATED_PATCH | Freq: Every day | TRANSDERMAL | Status: DC
  Administered 2020-01-25: 21 mg via TRANSDERMAL
  Filled 2020-01-24: qty 1

## 2020-01-24 MED ORDER — ACETAMINOPHEN 325 MG PO TABS: 650.0000 mg | ORAL_TABLET | Freq: Four times a day (QID) | ORAL | Status: DC | PRN

## 2020-01-24 MED ORDER — DEXTROMETHORPHAN POLISTIREX ER 30 MG/5ML PO SUER
15.0000 mg | Freq: Two times a day (BID) | ORAL | Status: DC
  Filled 2020-01-24 (×3): qty 5

## 2020-01-24 MED ORDER — PANTOPRAZOLE SODIUM 40 MG PO TBEC
40.0000 mg | DELAYED_RELEASE_TABLET | Freq: Every day | ORAL | Status: DC
  Administered 2020-01-25: 40 mg via ORAL
  Filled 2020-01-24: qty 1

## 2020-01-24 MED ORDER — HYDRALAZINE HCL 20 MG/ML IJ SOLN
5.0000 mg | INTRAMUSCULAR | Status: DC | PRN
  Administered 2020-01-24: 5 mg via INTRAVENOUS
  Filled 2020-01-24: qty 1

## 2020-01-24 MED ORDER — ROSUVASTATIN CALCIUM 20 MG PO TABS
40.0000 mg | ORAL_TABLET | Freq: Every day | ORAL | Status: DC
  Administered 2020-01-25: 40 mg via ORAL
  Filled 2020-01-24: qty 4
  Filled 2020-01-24: qty 2

## 2020-01-24 MED ORDER — ASPIRIN EC 81 MG PO TBEC
81.0000 mg | DELAYED_RELEASE_TABLET | Freq: Every day | ORAL | Status: DC
  Administered 2020-01-24 – 2020-01-25 (×2): 81 mg via ORAL
  Filled 2020-01-24 (×2): qty 1

## 2020-01-24 MED ORDER — VITAMIN B-6 50 MG PO TABS
100.0000 mg | ORAL_TABLET | Freq: Every day | ORAL | Status: DC
  Administered 2020-01-25: 100 mg via ORAL
  Filled 2020-01-24: qty 2

## 2020-01-24 MED ORDER — ALPRAZOLAM 0.25 MG PO TABS
0.1250 mg | ORAL_TABLET | Freq: Every evening | ORAL | Status: DC | PRN
  Filled 2020-01-24: qty 1

## 2020-01-24 MED ORDER — SODIUM CHLORIDE 0.9% FLUSH
3.0000 mL | Freq: Two times a day (BID) | INTRAVENOUS | Status: DC
  Administered 2020-01-24: 3 mL via INTRAVENOUS

## 2020-01-24 MED ORDER — INSULIN ASPART 100 UNIT/ML ~~LOC~~ SOLN: 0.0000 [IU] | Freq: Every day | SUBCUTANEOUS | Status: DC

## 2020-01-24 MED ORDER — ENOXAPARIN SODIUM 40 MG/0.4ML ~~LOC~~ SOLN
40.0000 mg | SUBCUTANEOUS | Status: DC
  Administered 2020-01-24: 40 mg via SUBCUTANEOUS
  Filled 2020-01-24: qty 0.4

## 2020-01-24 MED ORDER — SODIUM CHLORIDE 0.9 % IV SOLN: INTRAVENOUS | Status: DC

## 2020-01-24 NOTE — Progress Notes (Signed)
Pt received to room 231 from ED.  Pt oriented to room and call bell.  See assessment and vs's.  Pt denies pain or distress.  SB on the monitor.

## 2020-01-24 NOTE — H&P (Signed)
History and Physical    Daniel Bray ZOX:[id number] DOB: 10/30/1934 DOA: 01/24/2020  Referring MD/NP/PA:   PCP: Hortencia Pilar, MD   Patient coming from:  The patient is coming from home.  At baseline, pt is independent for most of ADL.        Chief Complaint: syncope  HPI: Daniel Bray is a 85 y.o. 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  Pt states that he was working outside in garage, and started feeling dizzy.  Patient states that he fell into the wall with his left shoulder.  Did not hit his head.  No headache, neck pain.  Denies left shoulder pain. He did have LOC in that moment, but later on he had syncope episode for about 15 minutes as per his wife. After the event, patient does not have unilateral numbness, tingling in his extremities.  No facial droop or slurred speech.  Patient denies chest pain, shortness breath, cough.  No fever or chills.  Denies nausea, vomiting, diarrhea, abdominal pain, symptoms of UTI. Per EDP, EMS reported that pt had bradycardia with heart rate 30s-40s.  His heart rate is at 50s in ED.  ED Course: pt was found to have WBC 15.0, troponin 39, pending COVID-19 PCR, renal function close to baseline, temperature normal, blood pressure 170/86, heart rate 57, RR 14, oxygen saturation 93% on room air.  Patient is placed on progressive bed for observation.  Cardiology, Dr. Clayborn Bigness is consulted.  Review of Systems:   General: no fevers, chills, no body weight gain, has fatigue HEENT: no blurry vision, hearing changes or sore throat Respiratory: no  dyspnea, coughing, wheezing CV: no chest pain, no palpitations GI: no nausea, vomiting, abdominal pain, diarrhea, constipation GU: no dysuria, burning on urination, increased urinary frequency, hematuria  Ext: no leg edema Neuro: no unilateral weakness, numbness, or tingling, no vision change or hearing loss. Had syncope and dizziness Skin: no rash, no skin tear. MSK: No muscle spasm, no deformity, no limitation of range of movement in spin Heme: No easy bruising.  Travel history: No recent long distant travel.  Allergy:  Allergies  Allergen Reactions  . Levaquin [Levofloxacin In D5w]     Weakness, low blood pressure  . Januvia [Sitagliptin] Rash    Past Medical History:  Diagnosis Date  . Cancer ([place])    kidney  . 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 Cancer ([place])    colon   . Diabetes mellitus without complication ([place])   . Hyperlipidemia   . Hypertension   . Myocardial infarction ([place])   . Streptococcal infection    Strep Bovis  . Thyroid disease     Past Surgical History:  Procedure Laterality Date  . CARDIAC ELECTROPHYSIOLOGY STUDY AND ABLATION    . CARDIAC SURGERY    . CORONARY ANGIOPLASTY WITH STENT PLACEMENT    . CORONARY ARTERY BYPASS GRAFT     x3  . SPLENECTOMY      Social History:  reports that he has been smoking cigars. He has been smoking about 1.00 pack per day. His smokeless tobacco use includes chew. He reports that he does not drink alcohol and does not  use drugs.  Family History:  Family History  Family history unknown: Yes     Prior to Admission medications   Medication Sig Start Date End Date Taking? Authorizing Provider  acetaminophen (TYLENOL) 650 MG CR tablet Take 650 mg by mouth 2 (two) times daily.    [provider]  ALPRAZolam Duanne Moron) 0.25 MG tablet Take 0.125 mg by mouth at bedtime as needed for sleep.     [provider]  aspirin EC 81 MG tablet Take 81 mg by mouth daily.    [provider]  carvedilol (COREG)  6.25 MG tablet Take 1 tablet (6.25 mg total) by mouth 2 (two) times daily with a meal. 10/13/19   Lorella Nimrod, MD  cholecalciferol (VITAMIN D) 1000 units tablet Take 2,500 Units by mouth daily.     [provider]  DM-APAP-CPM (CORICIDIN HBP) 10-325-2 MG TABS Take 10 mg by mouth 2 (two) times daily.    [provider]  Dulaglutide (TRULICITY) 5.36 IW/8.0HO SOPN Inject 0.75 mg into the skin once a week.    [provider]  empagliflozin (JARDIANCE) 10 MG TABS tablet Take 10 mg by mouth daily.    [provider]  gabapentin (NEURONTIN) 300 MG capsule Take 300 mg by mouth at bedtime.    [provider]  glipiZIDE (GLUCOTROL) 5 MG tablet Take 10 mg by mouth 2 (two) times daily with a meal.     [provider]  hydrALAZINE (APRESOLINE) 25 MG tablet Take 25 mg by mouth 2 (two) times daily.    [provider]  levothyroxine (SYNTHROID) 75 MCG tablet Take 75 mcg by mouth daily before breakfast.     [provider]  lisinopril (PRINIVIL,ZESTRIL) 5 MG tablet Take 20 mg by mouth 2 (two) times daily.     [provider]  omeprazole (PRILOSEC) 40 MG capsule Take 20 mg by mouth daily.     [provider]  pyridoxine (B-6) 100 MG tablet Take 100 mg by mouth daily.    [provider]  rosuvastatin (CRESTOR) 40 MG tablet Take 40 mg by mouth daily.    [provider]    Physical Exam: Vitals:   01/24/20 1114 01/24/20 1125 01/24/20 1200 01/24/20 1300  BP:  (!) 180/90 (!) 170/86 (!) 183/100  Pulse:  60 (!) 57 (!) 59  Resp:  20 14 11   Temp: 97.6 F (36.4 C) 97.6 F (36.4 C)    TempSrc: Oral Oral    SpO2:  98% 93% 96%  Weight:      Height:       General: Not in acute distress HEENT:       Eyes: PERRL, EOMI, no scleral icterus.       ENT: No discharge from the ears and nose, no pharynx injection, no tonsillar enlargement.        Neck: No JVD, no bruit, no mass felt. Heme: No neck lymph node  enlargement. Cardiac: S1/S2, RRR, No murmurs, No gallops or rubs. Respiratory: No rales, wheezing, rhonchi or rubs. GI: Soft, nondistended, nontender, no rebound pain, no organomegaly, BS present. GU: No hematuria Ext: No pitting leg edema bilaterally. 2+DP/PT pulse bilaterally. Musculoskeletal: No joint deformities, No joint redness or warmth, no limitation of ROM in spin. Skin: No rashes.  Neuro: Alert, oriented X3, cranial nerves II-XII grossly intact, moves all extremities normally.  Psych: Patient is not psychotic, no suicidal or hemocidal ideation.  Labs on Admission: I have personally reviewed following labs and imaging  studies  CBC: Recent Labs  Lab 01/24/20 1116  WBC 15.0*  NEUTROABS 7.6  HGB 14.0  HCT 42.5  MCV 99.3  PLT 062   Basic Metabolic Panel: Recent Labs  Lab 01/24/20 1116  NA 137  K 4.5  CL 106  CO2 22  GLUCOSE 236*  BUN 28*  CREATININE 1.69*  CALCIUM 10.0  MG 1.9   GFR: Estimated Creatinine Clearance: 34.8 mL/min (A) (by C-G formula based on SCr of 1.69 mg/dL (H)). Liver Function Tests: Recent Labs  Lab 01/24/20 1116  AST 25  ALT 31  ALKPHOS 59  BILITOT 0.7  PROT 6.8  ALBUMIN 3.6   No results for input(s): LIPASE, AMYLASE in the last 168 hours. No results for input(s): AMMONIA in the last 168 hours. Coagulation Profile: No results for input(s): INR, PROTIME in the last 168 hours. Cardiac Enzymes: No results for input(s): CKTOTAL, CKMB, CKMBINDEX, TROPONINI in the last 168 hours. BNP (last 3 results) No results for input(s): PROBNP in the last 8760 hours. HbA1C: No results for input(s): HGBA1C in the last 72 hours. CBG: No results for input(s): GLUCAP in the last 168 hours. Lipid Profile: No results for input(s): CHOL, HDL, LDLCALC, TRIG, CHOLHDL, LDLDIRECT in the last 72 hours. Thyroid Function Tests: No results for input(s): TSH, T4TOTAL, FREET4, T3FREE, THYROIDAB in the last 72 hours. Anemia Panel: No results for input(s):  VITAMINB12, FOLATE, FERRITIN, TIBC, IRON, RETICCTPCT in the last 72 hours. Urine analysis:    Component Value Date/Time   COLORURINE STRAW (A) 01/24/2020 1301   APPEARANCEUR CLEAR (A) 01/24/2020 1301   LABSPEC 1.014 01/24/2020 1301   PHURINE 6.0 01/24/2020 1301   GLUCOSEU >=500 (A) 01/24/2020 1301   HGBUR NEGATIVE 01/24/2020 1301   BILIRUBINUR NEGATIVE 01/24/2020 1301   KETONESUR NEGATIVE 01/24/2020 1301   PROTEINUR NEGATIVE 01/24/2020 1301   NITRITE NEGATIVE 01/24/2020 1301   LEUKOCYTESUR NEGATIVE 01/24/2020 1301   Sepsis Labs: @LABRCNTIP (procalcitonin:4,lacticidven:4) ) Recent Results (from the past 240 hour(s))  SARS Coronavirus 2 by RT PCR (hospital order, performed in [place] hospital lab) Nasopharyngeal Nasopharyngeal Swab     Status: None   Collection Time: 01/24/20  1:01 PM   Specimen: Nasopharyngeal Swab  Result Value Ref Range Status   SARS Coronavirus 2 NEGATIVE NEGATIVE Final    Comment: (NOTE) SARS-CoV-2 target nucleic acids are NOT DETECTED.  The SARS-CoV-2 RNA is generally detectable in upper and lower respiratory specimens during the acute phase of infection. The lowest concentration of SARS-CoV-2 viral copies this assay can detect is 250 copies / mL. A negative result does not preclude SARS-CoV-2 infection and should not be used as the sole basis for treatment or other patient management decisions.  A negative result may occur with improper specimen collection / handling, submission of specimen other than nasopharyngeal swab, presence of viral mutation(s) within the areas targeted by this assay, and inadequate number of viral copies (<250 copies / mL). A negative result must be combined with clinical observations, patient history, and epidemiological information.  Fact Sheet for Patients:   StrictlyIdeas.no  Fact Sheet for Healthcare Providers: BankingDealers.co.za  This test is not yet approved or   cleared by the [place] FDA and has been authorized for detection and/or diagnosis of SARS-CoV-2 by FDA under an Emergency Use Authorization (EUA).  This EUA will remain in effect (meaning this test can be used) for the duration of the COVID-19 declaration under Section 564(b)(1) of the Act, 21 U.S.C. section 360bbb-3(b)(1), unless the authorization is terminated  or revoked sooner.  Performed at [facility], [address], [place], [place] [address]      Radiological Exams on Admission: No results found.   EKG: Independently reviewed.  Sinus rhythm, QTC 411, bradycardia with heart rate 58, LAD, poor R wave progression, anteroseptal infarction pattern, ST depression in V5-V6    Assessment/Plan Principal Problem:   Syncope Active Problems:   Type 2 diabetes mellitus with stage 3 chronic kidney disease (HCC)   Essential hypertension   Hypothyroidism   Elevated troponin   Leukocytosis   HLD (hyperlipidemia)   GERD (gastroesophageal reflux disease)   Anxiety   CAD (coronary artery disease)   Tobacco abuse   CKD (chronic kidney disease), stage IIIa   Bradycardia   Syncope: Etiology is not clear. Patient had bradycardia, which could be the reason.  Other differential diagnosis is broad, including vasovagal syncope, TIA, arrhythmia, ACS, orthostatic status  - Place on tele bed for obs - Orthostatic vital signs  - MRI-brain - 2d echo - Neuro checks  - start ASA - IVF: NS 75 cc/h - PT/OT eval and treat  Bradycardia: Etiology is unclear. -Hold Coreg - Dr [facility] cardiologist consulted  Elevated troponin and hx of CAD: s/p of CABG. troponin 39, no CP.  Likely due to demand ischemia -Aspirin, Crestor -Trend troponin -Check A1c, FLP -Repeat EKG in morning -f/u 2D echo  Type 2 diabetes mellitus with stage 3 chronic kidney disease ([place]): Most recent A1c 7.6, poorly controled. Patient is taking Trulicity, glipizide, Jardiance at home -SSI  Essential  hypertension -As needed hydralazine -Hold HCTZ and Coreg -Continue lisinopril  Hypothyroidism -Synthroid  Leukocytosis: Patient has chronic leukocytosis, previous WBC 26.2 recently, today WBC 15.0.  No signs of infection -Follow-up with CBC  HLD (hyperlipidemia) -Crestor  GERD (gastroesophageal reflux disease) -Protonix  Anxiety -Continue home Xanax  Tobacco abuse -Nicotine patch  CKD (chronic kidney disease), stage IIIa: Baseline creatinine 1.4-1.8.  His creatinine is 1.69, BUN 28.  Close to baseline. -Follow-up by BMP    DVT ppx:  SQ Lovenox Code Status: Full code Family Communication:    Yes, patient's wife at bed side Disposition Plan:  Anticipate discharge back to previous environment Consults called:  Dr. Clayborn Bigness of card Admission status: Med-surg bed for obs     Status is: Observation  The patient remains OBS appropriate and will d/c before 2 midnights.  Dispo: The patient is from: Home              Anticipated d/c is to: Home              Anticipated d/c date is: 1 day              Patient currently is not medically stable to d/c.         Date of Service 01/24/2020    Ivor Costa [facility]   If 7PM-7AM, please contact night-coverage [email or web address] 01/24/2020, 2:49 PM

## 2020-01-24 NOTE — ED Provider Notes (Signed)
[facility] Emergency Department Provider Note  ____________________________________________   First MD Initiated Contact with Patient 01/24/20 1115     (approximate)  I have reviewed the triage vital signs and the nursing notes.   HISTORY  Chief Complaint Loss of Consciousness    HPI Daniel Bray is a 85 y.o. 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  Patient states that he was working outside in a cool garage when he got dizzy.  Patient states that he fell into the wall with his left shoulder.  Did not hit his head did not lose consciousness at that time.  He went to sit down when he started to feel lightheaded so he laid down and had brief LOC.  He denies any chest pain or shortness of breath prior to passing out.  He denies hitting his head stated he was already laying down when he lost consciousness.  Patient given 500 cc of fluid with EMS.  States he feels better at this.  Continues to deny any chest pain, shortness of breath, abdominal pain.  According to EMS when they first got there he was bradycardic into the 40s and hypotensive and this got better with sitting him up.  They also reported that while on the monitor he had a brief few seconds of what looks like sinus bradycardia is in the 30s although they were not able to capture a rhythm strip of this given he went back up to normal sinus.  He did have an EKG that was transmitted over that showed a little bit of ST elevation in V2 V3 with some T wave inversions in 1 aVF V5 and V6.  This was reviewed by the other doctor and STEMI alert was not called.  It was compared to prior EKG and looks pretty similar.           Past Medical History:  Diagnosis Date  . Cancer ([place])    kidney  . Cancer ([place])    pancreas  . Cancer ([place])    colon   . Diabetes mellitus without complication ([place])   . Hyperlipidemia   . Hypertension   . Myocardial infarction ([place])   . Streptococcal infection    Strep Bovis  . Thyroid disease     Patient Active Problem List   Diagnosis Date Noted  . Laceration of forehead   . Syncope 10/12/2019  . Type 2 diabetes mellitus with stage 3 chronic kidney disease ([place]) 10/12/2019  . Essential hypertension 10/12/2019  . History of renal cell carcinoma 10/12/2019  . Partial small bowel obstruction ([place]) 10/12/2019  . Hypothyroidism 10/12/2019    Past Surgical History:  Procedure Laterality Date  . CARDIAC ELECTROPHYSIOLOGY STUDY AND ABLATION    . CARDIAC SURGERY    . CORONARY ANGIOPLASTY WITH STENT PLACEMENT    . CORONARY ARTERY BYPASS GRAFT     x3  . SPLENECTOMY      Prior to Admission medications   Medication Sig Start Date End Date Taking? Authorizing Provider  acetaminophen (TYLENOL) 650 MG CR tablet Take 650 mg by mouth 2 (two) times daily.    [provider]  ALPRAZolam Duanne Moron) 0.25 MG tablet Take 0.125 mg by mouth  at bedtime as needed for sleep.     [provider]  aspirin EC 81 MG tablet Take 81 mg by mouth daily.    [provider]  carvedilol (COREG) 6.25 MG tablet Take 1 tablet (6.25 mg total) by mouth 2 (two) times daily with a meal. 10/13/19   Lorella Nimrod, MD  cholecalciferol (VITAMIN D) 1000 units tablet Take 2,500 Units by mouth daily.     [provider]  DM-APAP-CPM (CORICIDIN HBP) 10-325-2 MG TABS Take 10 mg by mouth 2 (two) times daily.    [provider]  Dulaglutide (TRULICITY) 5.36 UY/4.0HK SOPN Inject 0.75 mg into the skin once a week.    [provider]  empagliflozin (JARDIANCE) 10 MG TABS tablet Take 10 mg by mouth daily.    [provider]  gabapentin (NEURONTIN) 300 MG  capsule Take 300 mg by mouth at bedtime.    [provider]  glipiZIDE (GLUCOTROL) 5 MG tablet Take 10 mg by mouth 2 (two) times daily with a meal.     [provider]  hydrALAZINE (APRESOLINE) 25 MG tablet Take 25 mg by mouth 2 (two) times daily.    [provider]  levothyroxine (SYNTHROID) 75 MCG tablet Take 75 mcg by mouth daily before breakfast.     [provider]  lisinopril (PRINIVIL,ZESTRIL) 5 MG tablet Take 20 mg by mouth 2 (two) times daily.     [provider]  omeprazole (PRILOSEC) 40 MG capsule Take 20 mg by mouth daily.     [provider]  pyridoxine (B-6) 100 MG tablet Take 100 mg by mouth daily.    [provider]  rosuvastatin (CRESTOR) 40 MG tablet Take 40 mg by mouth daily.    [provider]    Allergies Levaquin [levofloxacin in d5w] and Januvia [sitagliptin]  Family History  Family history unknown: Yes    Social History Social History   Tobacco Use  . Smoking status: Current Every Day Smoker    Packs/day: 1.00    Types: Cigars  . Smokeless tobacco: Current User    Types: Chew  Vaping Use  . Vaping Use: Never used  Substance Use Topics  . Alcohol use: No  . Drug use: No      Review of Systems Constitutional: No fever/chills Eyes: No visual changes. ENT: No sore throat. Cardiovascular: Denies chest pain.  Syncope Respiratory: Denies shortness of breath. Gastrointestinal: No abdominal pain.  No nausea, no vomiting.  No diarrhea.  No constipation. Genitourinary: Negative for dysuria. Musculoskeletal: Negative for back pain. Skin: Negative for rash. Neurological: Negative for headaches, focal weakness or numbness.  Dizziness now resolved All other ROS negative ____________________________________________   PHYSICAL EXAM:  VITAL SIGNS: ED Triage Vitals  Enc Vitals Group     BP 01/24/20 1111 (!) 182/93     Pulse Rate 01/24/20 1111 69     Resp 01/24/20 1111 14     Temp  01/24/20 1114 97.6 F (36.4 C)     Temp Source 01/24/20 1114 Oral     SpO2 01/24/20 1111 100 %     Weight 01/24/20 1109 170 lb (77.1 kg)     Height 01/24/20 1109 5\' 10"  (1.778 m)     Head Circumference --      Peak Flow --      Pain Score 01/24/20 1113 0     Pain Loc --      Pain Edu? --      Excl. in  GC? --     Constitutional: Alert and oriented. Well appearing and in no acute distress. Eyes: Conjunctivae are normal. EOMI. Head: Atraumatic. Nose: No congestion/rhinnorhea. Mouth/Throat: Mucous membranes are moist.   Neck: No stridor. Trachea Midline. FROM Cardiovascular: Normal rate, regular rhythm. Grossly normal heart sounds.  Good peripheral circulation. Respiratory: Normal respiratory effort.  No retractions. Lungs CTAB. Gastrointestinal: Soft and nontender. No distention. No abdominal bruits.  Musculoskeletal: No lower extremity tenderness nor edema.  No joint effusions. Neurologic:  Normal speech and language. No gross focal neurologic deficits are appreciated.  Equal strength in arms and legs Skin:  Skin is warm, dry and intact. No rash noted. Psychiatric: Mood and affect are normal. Speech and behavior are normal. GU: Deferred   ____________________________________________   LABS (all labs ordered are listed, but only abnormal results are displayed)  Labs Reviewed  CBC WITH DIFFERENTIAL/PLATELET - Abnormal; Notable for the following components:      Result Value   WBC 15.0 (*)    All other components within normal limits  COMPREHENSIVE METABOLIC PANEL - Abnormal; Notable for the following components:   Glucose, Bld 236 (*)    BUN 28 (*)    Creatinine, Ser 1.69 (*)    GFR calc non Af Amer 37 (*)    GFR calc Af Amer 43 (*)    All other components within normal limits  TROPONIN I (HIGH SENSITIVITY) - Abnormal; Notable for the following components:   Troponin I (High Sensitivity) 39 (*)    All other components within normal limits  MAGNESIUM  URINALYSIS, COMPLETE  (UACMP) WITH MICROSCOPIC   ____________________________________________   ED ECG REPORT I, Vanessa [place], the attending physician, personally viewed and interpreted this ECG.  Sinus bradycardia rate of 58 type I AV block, similar ST elevation in V1 V2 V3 as previous as well as some T wave inversions in V4 V5 V6. ____________________________________________   PROCEDURES  Procedure(s) performed (including Critical Care):  .1-3 Lead EKG Interpretation Performed by: Vanessa [place], MD Authorized by: Vanessa [place], MD     Interpretation: abnormal     ECG rate:  60-70s    ECG rate assessment: normal     Rhythm: sinus rhythm     Ectopy: none     Conduction: abnormal     Abnormal conduction: 1st degree AV block       ____________________________________________   INITIAL IMPRESSION / ASSESSMENT AND PLAN / ED COURSE  Daniel Bray was evaluated in Emergency Department on 01/24/2020 for the symptoms described in the history of present illness. He was evaluated in the context of the global COVID-19 pandemic, which necessitated consideration that the patient might be at risk for infection with the SARS-CoV-2 virus that causes COVID-19. Institutional protocols and algorithms that pertain to the evaluation of patients at risk for COVID-19 are in a state of rapid change based on information released by regulatory bodies including the CDC and federal and state organizations. These policies and algorithms were followed during the patient's care in the ED.    Patient is a 85 year old gentleman who comes in with syncopal episode.  His EKG does have a little ST elevation with some T wave inversions but looks similar to prior EKGs and EKG done here looks similar to EMSs EKG.  No dynamic changes.  The concern for some bradycardia will keep patient on the cardiac monitor to evaluate for arrhythmia.  Will get labs to evaluate for electrolyte abnormalities, AKI, cardiac markers evaluate for  ACS.   Denies any abdominal pain to suggest AAA rupture.  Denies any shortness of breath to suggest PE.  Denies hitting his head and denies any headaches to suggest intracranial hemorrhage.  He has a normal neuro exam at this time.  Labs show a white count of 15 which is downtrending from 3 months ago  Kidney function was 1.69 which is around his baseline  Glucose was 236 but no anion gap elevation  Cardiac markers 39 which is up from his baseline in the 20s.  12:12 PM patient's wife is now at bedside.  She also states that patient did not hit his head.  However she does report the patient was out for about 15 minutes prior to regaining consciousness which is longer than what patient thought.  There was no seizure-like activity.  No urinary incontinence.  Discussed with them that given the prolonged loss of consciousness, the low heart rates and his significant history and age I would recommend admission for syncopal work-up.  He will need to have his cardiac markers trended out given they were slightly elevated at 39.  They expressed understanding are not open to admission       ____________________________________________   FINAL CLINICAL IMPRESSION(S) / ED DIAGNOSES   Final diagnoses:  Syncope and collapse      MEDICATIONS GIVEN DURING THIS VISIT:  Medications - No data to display   ED Discharge Orders    None       Note:  This document was prepared using Dragon voice recognition software and may include unintentional dictation errors.   Vanessa [place], MD 01/24/20 1213

## 2020-01-24 NOTE — ED Notes (Signed)
Hold BC until I can discuss with admitting MD as pt states that he has chronically elevated WBC and it is actually lower today than usual per pt (he states that it was 22 last time it was checked).  Called lab to ensure all other labs are added to blood that was drawn.

## 2020-01-24 NOTE — ED Notes (Signed)
Pt continues to be pain free and has wife at bedside.  Delay explained to pt

## 2020-01-24 NOTE — ED Notes (Signed)
Gave pt urinal and advised that urine sample is ordered.

## 2020-01-24 NOTE — ED Triage Notes (Signed)
Patient from home via [facility]. EMS called out for syncopal episode. Patient reports he got dizzy and fell into the wall, hitting his left shoulder. Reports he was able to sit down and then had a syncopal episode.Denies fall or injury. Patient states he has had similar episodes in the past several years ago. Hx of bypass surgery. Patient pale and diaphoretic upon arrival.

## 2020-01-24 NOTE — Consult Note (Signed)
CARDIOLOGY CONSULT NOTE               Patient ID: Daniel Bray MRN: [id number] DOB/AGE: 83-Nov-1936 83 y.o.  Admit date: 01/24/2020 Referring Physician Dr Ivor Costa hospitalist Primary Physician Hortencia Pilar MD primary Primary Cardiologist Venetia Maxon cardiologist at [facility] Reason for Consultation bradycardia possible syncope  HPI: Patient is a 85 year old male with a history of multivessel coronary disease including coronary bypass surgery 2016 at [facility] patient's had history of hypertension hyperlipidemia diabetes renal insufficiency GERD states to been working in the yard today and got hot sweaty he was in his way in to get some water with a started to feel lightheaded dizzy and felt like he was going to blackout he hit his right shoulder against a door frame and in front of his wife he had what seemed like a syncopal episode he was partly out for about 15 minutes he could hear his family around him but he did not respond.  Is not clear if he ever totally lost consciousness.  He has had episodes of syncope in the past last summer the wife states there was a similar episode.  The patient complained of severe diaphoresis but no nausea vomiting.  Denied any significant chest pain or shortness of breath during the episode.  Rescue was called and he was transported to the emergency room reportedly during transport EMS thought disorder very low heart rates in the 40s.  Patient is now in emergency room feels reasonably well troponins are flat EKGs unchanged patient feels back to normal.  Review of systems complete and found to be negative unless listed above     Past Medical History:  Diagnosis Date  . Cancer ([place])    kidney  . Cancer ([place])    pancreas  . Cancer ([place])    colon   . Diabetes mellitus without complication ([place])   . Hyperlipidemia   . Hypertension   . Myocardial infarction ([place])   . Streptococcal infection    Strep Bovis  . Thyroid disease     Past Surgical  History:  Procedure Laterality Date  . CARDIAC ELECTROPHYSIOLOGY STUDY AND ABLATION    . CARDIAC SURGERY    . CORONARY ANGIOPLASTY WITH STENT PLACEMENT    . CORONARY ARTERY BYPASS GRAFT     x3  . SPLENECTOMY      (Not in a hospital admission)  Social History   Socioeconomic History  . Marital status: Married    Spouse name: Not on file  . Number of children: Not on file  . Years of education: Not on file  . Highest education level: Not on file  Occupational History  . Not on file  Tobacco Use  . Smoking status: Current Every Day Smoker    Packs/day: 1.00    Types: Cigars  . Smokeless tobacco: Current User    Types: Chew  Vaping Use  . Vaping Use: Never used  Substance and Sexual Activity  . Alcohol use: No  . Drug use: No  . Sexual activity: Not on file  Other Topics Concern  . Not on file  Social History Narrative  . Not on file   Social Determinants of Health   Financial Resource Strain:   . Difficulty of Paying Living Expenses:   Food Insecurity:   . Worried About Charity fundraiser in the Last Year:   . Arboriculturist in the Last Year:   Transportation Needs:   . Lack of Transportation (  Medical):   Marland Kitchen Lack of Transportation (Non-Medical):   Physical Activity:   . Days of Exercise per Week:   . Minutes of Exercise per Session:   Stress:   . Feeling of Stress :   Social Connections:   . Frequency of Communication with Friends and Family:   . Frequency of Social Gatherings with Friends and Family:   . Attends Religious Services:   . Active Member of Clubs or Organizations:   . Attends Archivist Meetings:   Marland Kitchen Marital Status:   Intimate Partner Violence:   . Fear of Current or Ex-Partner:   . Emotionally Abused:   Marland Kitchen Physically Abused:   . Sexually Abused:     Family History  Family history unknown: Yes      Review of systems complete and found to be negative unless listed above      PHYSICAL EXAM  General: Well developed, well  nourished, in no acute distress HEENT:  Normocephalic and atramatic Neck:  No JVD.  Lungs: Clear bilaterally to auscultation and percussion. Heart: HRRR . Normal S1 and S2 without gallops or murmurs.  Abdomen: Bowel sounds are positive, abdomen soft and non-tender  Msk:  Back normal, normal gait. Normal strength and tone for age. Extremities: No clubbing, cyanosis or edema.   Neuro: Alert and oriented X 3. Psych:  Good affect, responds appropriately  Labs:   Lab Results  Component Value Date   WBC 15.0 (H) 01/24/2020   HGB 14.0 01/24/2020   HCT 42.5 01/24/2020   MCV 99.3 01/24/2020   PLT 204 01/24/2020    Recent Labs  Lab 01/24/20 1116  NA 137  K 4.5  CL 106  CO2 22  BUN 28*  CREATININE 1.69*  CALCIUM 10.0  PROT 6.8  BILITOT 0.7  ALKPHOS 59  ALT 31  AST 25  GLUCOSE 236*   Lab Results  Component Value Date   CKTOTAL 33 (L) 02/07/2015   TROPONINI 0.05 (HH) 10/03/2016   No results found for: CHOL No results found for: HDL No results found for: LDLCALC No results found for: TRIG No results found for: CHOLHDL No results found for: LDLDIRECT    Radiology: No results found.  EKG: Normal sinus rhythm nonspecific ST-T wave changes rate of around 60  ASSESSMENT AND PLAN:  Bradycardia Syncope possibly vasovagal Dehydration Chronic renal insufficiency History of coronary bypass surgery Multivessel coronary disease Diabetes type 2 Hypertension Hyperlipidemia GERD Flat troponins probably demand ischemia . Plan Agree with observation on telemetry Recommend hydration Consider holding Coreg for now and possibly reducing the dose Continue diabetes management and control Recommend amlodipine to help with blood pressure support Continue lisinopril hydralazine and reduce Coreg continue blood pressure management Agree with omeprazole therapy for reflux type symptoms Maintain Crestor therapy for lipid management Orthostatic blood pressures should be obtained If  the patient feels back to normal then early discharge care can be considered Consider nephrology follow-up with renal insufficiency Mildly elevated white count at 15 of unclear etiology may be related to acute phase reactant because of dehydration, he has had elevated white counts previously much higher than 15 Consider Holter monitor as an outpatient with his primary cardiologist at [place] to 6.25 twice a day until seen by his cardiologist No clear indication for permanent pacemaker at this point  Signed: Yolonda Kida MD 01/24/2020, 2:02 PM

## 2020-01-24 NOTE — ED Notes (Signed)
Pt arrives with shirt wet from sweat, removed shirt.  Pt has 750cc NS pta and states that he is feeling much better.  Pt was outside working when he had a syncopal episode. Pt is now alert and oriented, no CP or sob

## 2020-01-24 NOTE — ED Notes (Signed)
Patient off the floor to MRI 

## 2020-01-24 NOTE — Plan of Care (Signed)
  Problem: Education: Goal: Knowledge of General Education information will improve Description: Including pain rating scale, medication(s)/side effects and non-pharmacologic comfort measures Outcome: Progressing   Problem: Health Behavior/Discharge Planning: Goal: Ability to manage health-related needs will improve Outcome: Progressing   Problem: Clinical Measurements: Goal: Ability to maintain clinical measurements within normal limits will improve Outcome: Progressing Goal: Will remain free from infection Outcome: Progressing Goal: Diagnostic test results will improve Outcome: Progressing Goal: Respiratory complications will improve Outcome: Progressing Goal: Cardiovascular complication will be avoided Outcome: Progressing   Problem: Safety: Goal: Ability to remain free from injury will improve Outcome: Progressing   Problem: Education: Goal: Knowledge of condition and prescribed therapy will improve Outcome: Progressing   Problem: Cardiac: Goal: Will achieve and/or maintain adequate cardiac output Outcome: Progressing   Problem: Physical Regulation: Goal: Complications related to the disease process, condition or treatment will be avoided or minimized Outcome: Progressing   Problem: Education: Goal: Knowledge of General Education information will improve Description: Including pain rating scale, medication(s)/side effects and non-pharmacologic comfort measures Outcome: Progressing   Problem: Health Behavior/Discharge Planning: Goal: Ability to manage health-related needs will improve Outcome: Progressing   Problem: Clinical Measurements: Goal: Ability to maintain clinical measurements within normal limits will improve Outcome: Progressing Goal: Will remain free from infection Outcome: Progressing Goal: Diagnostic test results will improve Outcome: Progressing Goal: Respiratory complications will improve Outcome: Progressing Goal: Cardiovascular complication  will be avoided Outcome: Progressing   Problem: Activity: Goal: Risk for activity intolerance will decrease Outcome: Progressing   Problem: Nutrition: Goal: Adequate nutrition will be maintained Outcome: Progressing   Problem: Coping: Goal: Level of anxiety will decrease Outcome: Progressing   Problem: Elimination: Goal: Will not experience complications related to bowel motility Outcome: Progressing Goal: Will not experience complications related to urinary retention Outcome: Progressing   Problem: Pain Managment: Goal: General experience of comfort will improve Outcome: Progressing   Problem: Safety: Goal: Ability to remain free from injury will improve Outcome: Progressing   Problem: Skin Integrity: Goal: Risk for impaired skin integrity will decrease Outcome: Progressing

## 2020-01-25 ENCOUNTER — Observation Stay
Admit: 2020-01-25 | Discharge: 2020-01-25 | Disposition: A | Discharge status: Home / Self Care | Payer: Medicare HMO | Attending: Internal Medicine | Admitting: Internal Medicine

## 2020-01-25 DIAGNOSIS — R55 Syncope and collapse: Secondary | ICD-10-CM | POA: Diagnosis not present

## 2020-01-25 DIAGNOSIS — I10 Essential (primary) hypertension: Secondary | ICD-10-CM | POA: Diagnosis not present

## 2020-01-25 DIAGNOSIS — R001 Bradycardia, unspecified: Secondary | ICD-10-CM | POA: Diagnosis not present

## 2020-01-25 LAB — ECHOCARDIOGRAM COMPLETE
Height: 70 in
Weight: 2428.8 oz

## 2020-01-25 LAB — LIPID PANEL
Cholesterol: 90 mg/dL (ref 0–200)
HDL: 36 mg/dL — ABNORMAL LOW (ref >40)
LDL Cholesterol: 27 mg/dL (ref 0–99)
Total CHOL/HDL Ratio: 2.5 RATIO
Triglycerides: 134 mg/dL (ref <150)
VLDL: 27 mg/dL (ref 0–40)

## 2020-01-25 LAB — BASIC METABOLIC PANEL
Anion gap: 6 (ref 5–15)
BUN: 25 mg/dL — ABNORMAL HIGH (ref 8–23)
CO2: 25 mmol/L (ref 22–32)
Calcium: 9.9 mg/dL (ref 8.9–10.3)
Chloride: 107 mmol/L (ref 98–111)
Creatinine, Ser: 1.49 mg/dL — ABNORMAL HIGH (ref 0.61–1.24)
GFR calc Af Amer: 50 mL/min — ABNORMAL LOW (ref >60)
GFR calc non Af Amer: 43 mL/min — ABNORMAL LOW (ref >60)
Glucose, Bld: 152 mg/dL — ABNORMAL HIGH (ref 70–99)
Potassium: 4.2 mmol/L (ref 3.5–5.1)
Sodium: 138 mmol/L (ref 135–145)

## 2020-01-25 LAB — CBC
HCT: 41 % (ref 39.0–52.0)
Hemoglobin: 13.8 g/dL (ref 13.0–17.0)
MCH: 32.6 pg (ref 26.0–34.0)
MCHC: 33.7 g/dL (ref 30.0–36.0)
MCV: 96.9 fL (ref 80.0–100.0)
Platelets: 203 10*3/uL (ref 150–400)
RBC: 4.23 MIL/uL (ref 4.22–5.81)
RDW: 13.2 % (ref 11.5–15.5)
WBC: 14.6 10*3/uL — ABNORMAL HIGH (ref 4.0–10.5)
nRBC: 0 % (ref 0.0–0.2)

## 2020-01-25 LAB — GLUCOSE, CAPILLARY
Glucose-Capillary: 107 mg/dL — ABNORMAL HIGH (ref 70–99)
Glucose-Capillary: 152 mg/dL — ABNORMAL HIGH (ref 70–99)

## 2020-01-25 MED ORDER — ROSUVASTATIN CALCIUM 10 MG PO TABS: 40.0000 mg | ORAL_TABLET | Freq: Every day | ORAL | Status: DC

## 2020-01-25 MED ORDER — CARVEDILOL 6.25 MG PO TABS
6.2500 mg | ORAL_TABLET | Freq: Two times a day (BID) | ORAL | 0 refills | Status: DC
Start: 2020-01-25 — End: 2020-05-28

## 2020-01-25 MED ORDER — ASPIRIN EC 81 MG PO TBEC: 81.0000 mg | DELAYED_RELEASE_TABLET | Freq: Every day | ORAL | Status: DC

## 2020-01-25 NOTE — Progress Notes (Signed)
Patient alert and oriented, vss, no complaints of pain.  D/c telemetry and PIV.  No questions at this time.  To be escorted out of hospital via wheelchair by volunteers.

## 2020-01-25 NOTE — Progress Notes (Signed)
OT Cancellation Note  Patient Details Name: Daniel Bray MRN: [id number] DOB: Nov 26, 1934   Cancelled Treatment:    Reason Eval/Treat Not Completed: Other (comment). Consult received, chart reviewed. PT working with pt. Will re-attempt at later time as pt is available and medically appropriate.   Jeni Salles, MPH, MS, OTR/L ascom [phone] 01/25/20, 10:41 AM

## 2020-01-25 NOTE — Evaluation (Signed)
Physical Therapy Evaluation Patient Details Name: Daniel Bray MRN: [id number] DOB: 09-15-1934 Today's Date: 01/25/2020   History of Present Illness  Per MD notes: Pt is an 85 y.o. 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  MD assessment includes: syncope, bradycardia, elevated troponin likely demand ischemia, and Leukocytosis.    Clinical Impression  Pt pleasant and motivated to participate during the session.  Pt reported feeling that he was at his functional baseline that included Ind amb community distances without an AD and Ind with all ADLs.  Orthostatic BP's obtained: supine 169/90, sitting 164/84, standing pre amb 141/83, and standing post amb 147/87, MD notified.  Pt asymptomatic throughout the session with HR in the 60s and SpO2 WNL.  Pt was Ind with all functional mobility with no skilled PT needs identified.  Will complete PT orders at this time but will reassess pt pending a change in status upon receipt of new PT orders.        Follow Up Recommendations No PT follow up    Equipment Recommendations  None recommended by PT    Recommendations for Other Services       Precautions / Restrictions Precautions Precautions: Fall Restrictions Weight Bearing Restrictions: No      Mobility  Bed Mobility Overal bed mobility: Independent                Transfers Overall transfer level: Independent                  Ambulation/Gait Ambulation/Gait assistance: Independent Gait Distance (Feet): 200 Feet Assistive device: None Gait Pattern/deviations: WFL(Within  Functional Limits)     General Gait Details: Good stability without an AD including during start/stops and sharp turns; no adverse symptoms during the session  Stairs            Wheelchair Mobility    Modified Rankin (Stroke Patients Only)       Balance Overall balance assessment: No apparent balance deficits (not formally assessed)                                           Pertinent Vitals/Pain Pain Assessment: No/denies pain    Home Living Family/patient expects to be discharged to:: Private residence Living Arrangements: Spouse/significant other Available Help at Discharge: Family;Available 24 hours/day Type of Home: House Home Access: Ramped entrance     Home Layout: One level Home Equipment: Grab bars - tub/shower;Cane - quad;Cane - single point;Walker - 2 wheels;Walker - 4 wheels;Bedside commode      Prior Function Level of Independence: Independent         Comments: Pt Ind and active, walks 2 miles per day without an AD, one fall in the last 6 months, Ind with ADLs     Hand Dominance        Extremity/Trunk Assessment   Upper Extremity Assessment Upper Extremity Assessment: Overall WFL for tasks assessed    Lower Extremity Assessment Lower Extremity Assessment: Overall WFL for tasks assessed       Communication   Communication: No difficulties  Cognition  Arousal/Alertness: Awake/alert Behavior During Therapy: WFL for tasks assessed/performed Overall Cognitive Status: Within Functional Limits for tasks assessed                                        General Comments      Exercises     Assessment/Plan    PT Assessment Patent does not need any further PT services  PT Problem List         PT Treatment Interventions      PT Goals (Current goals can be found in the Care Plan section)  Acute Rehab PT Goals PT Goal Formulation: All assessment and education complete, DC therapy    Frequency      Barriers to discharge        Co-evaluation               AM-PAC PT "6 Clicks" Mobility  Outcome Measure Help needed turning from your back to your side while in a flat bed without using bedrails?: None Help needed moving from lying on your back to sitting on the side of a flat bed without using bedrails?: None Help needed moving to and from a bed to a chair (including a wheelchair)?: None Help needed standing up from a chair using your arms (e.g., wheelchair or bedside chair)?: None Help needed to walk in hospital room?: None Help needed climbing 3-5 steps with a railing? : None 6 Click Score: 24    End of Session Equipment Utilized During Treatment: Gait belt Activity Tolerance: Patient tolerated treatment well Patient left: in chair;with call bell/phone within reach;with chair alarm set;with family/visitor present Nurse Communication: Mobility status;Other (comment) (MD notified of orthostatic BP results) PT Visit Diagnosis: Muscle weakness (generalized) (M62.81)    Time: 3893-7342 PT Time Calculation (min) (ACUTE ONLY): 40 min   Charges:   PT Evaluation $PT Eval Low Complexity: 1 Low          Daniel Bray PT, DPT 01/25/20, 1:23 PM

## 2020-01-25 NOTE — Discharge Summary (Signed)
Physician Discharge Summary  Daniel Bray LFY:[id number] DOB: Apr 28, 1935 DOA: 01/24/2020  PCP: Hortencia Pilar, MD  Admit date: 01/24/2020 Discharge date: 01/25/2020  Admitted From: home Disposition: home  Recommendations for Outpatient Follow-up:  1. Follow up with PCP in 1-2 weeks 2. F/u cardio in 1 week   Home Health: no  Equipment/Devices:  Discharge Condition: stable  CODE STATUS: full  Diet recommendation: Heart Healthy / Carb Modified   Brief/Interim Summary: HPI was taken from Dr. Blaine Hamper: Daniel Bray is a 85 y.o. male with medical history significant of hypertension, hyperlipidemia, diabetes mellitus, GERD, hypothyroidism, anxiety, CAD, CABG, tobacco abuse, CKD-3, kidney cancer (s/p of kidney resection, complicated by recurrence to his pancreas, s/p of splenectomy, partial pancreas resection, partial intestine resection, recent concern for recurrence of his kidney cancer onpancreas and had 5 doses of radiation back in May), who presents with syncope.  Pt states that he was working outside in garage, and started feeling dizzy. Patient states that he fell into the wall with his left shoulder. Did not hit his head.  No headache, neck pain.  Denies left shoulder pain. He did have LOC in that moment, but later on he had syncope episode for about 15 minutes as per his wife. After the event, patient does not have unilateral numbness, tingling in his extremities.  No facial droop or slurred speech.  Patient denies chest pain, shortness breath, cough.  No fever or chills.  Denies nausea, vomiting, diarrhea, abdominal pain, symptoms of UTI. Per EDP, EMS reported that pt had bradycardia with heart rate 30s-40s.  His heart rate is at 50s in ED.  ED Course: pt was found to have WBC 15.0, troponin 39, pending COVID-19 PCR, renal function close to baseline, temperature normal, blood pressure 170/86, heart rate 57, RR 14, oxygen saturation 93% on room air.  Patient is placed on  progressive bed for observation.  Cardiology, Dr. Clayborn Bigness is consulted.  Hospital Course from Dr. Lenise Herald 01/25/20: Pt presented after a syncopal episode of unknown etiology. The possible etiologies were bradycardia vs orthostatic hypotension. Pt's coreg was held inpatient and at d/c reduced to 6.25mg  BID as per cardio. No clear indication for a pacemaker as per cardio. Of note, pt was found to be positive for orthostatic hypotension but was asymptomatic on day of discharge. Echo was done which showed EF 85-27%, normal diastolic function, and no wall motion abnormalities.   Discharge Diagnoses:  Principal Problem:   Syncope Active Problems:   Type 2 diabetes mellitus with stage 3 chronic kidney disease (HCC)   Essential hypertension   Hypothyroidism   Elevated troponin   Leukocytosis   HLD (hyperlipidemia)   GERD (gastroesophageal reflux disease)   Anxiety   CAD (coronary artery disease)   Tobacco abuse   CKD (chronic kidney disease), stage IIIa   Bradycardia  Syncope: etiology is unclear, possibly secondary to bradycardia vs orthostatic hypotension. Reduce coreg dose at d/c as per cardio. No clear indication for a pacemaker currently as per cardio. Echo shows EF 78-24%, normal diastolic function, and no wall motion abnormalities.  MRI brain shows no evidence of recent infarction, hemorrhage or mass.   Bradycardia: etiology unclear. Reduced coreg dose as per cardio   Elevated troponin and hx of CAD: s/p of CABG. troponin 39, no CP.  Likely due to demand ischemia. Continue on aspirin, statin   DM2: continue on SSI w/ accuchecks. Hold trulicity, glipizide, jardiance   Essential hypertension: continue on lisinopril. Hold coreg, HCTZ  Hypothyroidism: continue  on levothyroxine   Leukocytosis: likely reactive. Will continue to monitor   HLD: continue on statin   GERD: continue on PPI   Anxiety: severity unknown. Continue home Xanax  Tobacco abuse: nicotine patch to  prevent w/drawal. Smoking cessation counseling   CKDIIIa: Baseline creatinine 1.4-1.8. Will continue to monitor   Discharge Instructions  Discharge Instructions    Diet - low sodium heart healthy   Complete by: As directed    Diet Carb Modified   Complete by: As directed    Discharge instructions   Complete by: As directed    F/u PCP in 1-2 weeks. F/u cardio w/in 1 week   Increase activity slowly   Complete by: As directed      Allergies as of 01/25/2020      Reactions   Levofloxacin Hives, Other (See Comments)   Levaquin [levofloxacin In D5w]    Weakness, low blood pressure   Januvia [sitagliptin] Rash      Medication List    TAKE these medications   acetaminophen 650 MG CR tablet Commonly known as: TYLENOL Take 650 mg by mouth 2 (two) times daily.   ALPRAZolam 0.25 MG tablet Commonly known as: XANAX Take 0.125 mg by mouth at bedtime as needed for sleep.   aspirin EC 81 MG tablet Take 81 mg by mouth daily.   carvedilol 6.25 MG tablet Commonly known as: Coreg Take 1 tablet (6.25 mg total) by mouth 2 (two) times daily. What changed:   medication strength  how much to take   cholecalciferol 1000 units tablet Commonly known as: VITAMIN D Take 2,500 Units by mouth daily.   Coricidin HBP 10-325-2 MG Tabs Generic drug: DM-APAP-CPM Take 10 mg by mouth 2 (two) times daily.   gabapentin 300 MG capsule Commonly known as: NEURONTIN Take 300 mg by mouth at bedtime.   glipiZIDE 5 MG tablet Commonly known as: GLUCOTROL Take 10 mg by mouth 2 (two) times daily with a meal.   hydrALAZINE 25 MG tablet Commonly known as: APRESOLINE Take 25 mg by mouth 2 (two) times daily.   hydrochlorothiazide 25 MG tablet Commonly known as: HYDRODIURIL Take 25 mg by mouth 2 (two) times daily. When needed   Jardiance 10 MG Tabs tablet Generic drug: empagliflozin Take 10 mg by mouth daily.   levothyroxine 75 MCG tablet Commonly known as: SYNTHROID Take 75 mcg by mouth daily  before breakfast.   lisinopril 5 MG tablet Commonly known as: ZESTRIL Take 20 mg by mouth 2 (two) times daily.   omeprazole 40 MG capsule Commonly known as: PRILOSEC Take 20 mg by mouth daily.   pyridoxine 100 MG tablet Commonly known as: B-6 Take 100 mg by mouth daily.   rosuvastatin 40 MG tablet Commonly known as: CRESTOR Take 40 mg by mouth daily.   Trulicity 4.23 NT/6.1WE Sopn Generic drug: Dulaglutide Inject 0.75 mg into the skin once a week.       Follow-up Information    Hortencia Pilar, MD. Schedule an appointment as soon as possible for a visit in 1 week(s).   Specialty: Family Medicine Why: please make appointment to follow up with your primary doctor.   Contact information: [place] [address] [phone]        Mikle Bosworth, MD. Schedule an appointment as soon as possible for a visit in 1 week(s).   Specialty: Cardiology Contact information: [place] [address] [phone]  Allergies  Allergen Reactions  . Levofloxacin Hives and Other (See Comments)  . Levaquin [Levofloxacin In D5w]     Weakness, low blood pressure  . Januvia [Sitagliptin] Rash    Consultations:  cardio   Procedures/Studies: MR BRAIN WO CONTRAST  Result Date: 01/24/2020 CLINICAL DATA:  Syncope EXAM: MRI HEAD WITHOUT CONTRAST TECHNIQUE: Multiplanar, multiecho pulse sequences of the brain and surrounding structures were obtained without intravenous contrast. COMPARISON:  None. FINDINGS: Brain: There is no acute infarction or intracranial hemorrhage. There is no intracranial mass, mass effect, or edema. There is no hydrocephalus or extra-axial fluid collection. Ventricles and sulci are within normal limits in size and configuration. Punctate focus of susceptibility in the right frontal subcortical white matter is compatible with chronic microhemorrhage or less likely mineralization. Patchy T2 hyperintensity in the  supratentorial white matter is nonspecific but may reflect mild chronic microvascular ischemic changes. Vascular: Major vessel flow voids at the skull base are preserved. Skull and upper cervical spine: Normal marrow signal is preserved. Sinuses/Orbits: Patchy mucosal thickening. There is focal opacification of a posterior right ethmoid air cell. Orbits are unremarkable. Other: Sella is partially empty.  Mastoid air cells are clear. IMPRESSION: No evidence of recent infarction, hemorrhage, or mass. Mild chronic microvascular ischemic changes. Electronically Signed   By: Macy Mis M.D.   On: 01/24/2020 21:56   ECHOCARDIOGRAM COMPLETE  Result Date: 01/25/2020    ECHOCARDIOGRAM REPORT   Patient Name:   LAVANCE BEAZER Date of Exam: 01/25/2020 Medical Rec #:  [id number]          Height:       70.0 in Accession #:    [id number]         Weight:       151.8 lb Date of Birth:  1934-08-29         BSA:          1.856 m Patient Age:    85 years           BP:           132/87 mmHg Patient Gender: M                  HR:           57 bpm. Exam Location:  [facility] Procedure: 2D Echo, Color Doppler and Cardiac Doppler Indications:     Syncope 780.2  History:         Patient has prior history of Echocardiogram examinations, most                  recent 10/13/2019.  Sonographer:     Sherrie Sport RDCS (AE) Referring Phys:  [id number] Soledad Gerlach NIU Diagnosing Phys: Yolonda Kida MD IMPRESSIONS  1. Left ventricular ejection fraction, by estimation, is 60 to 65%. Left ventricular ejection fraction by PLAX is 63 %. The left ventricle has normal function. The left ventricle has no regional wall motion abnormalities. There is mild concentric left ventricular hypertrophy. Left ventricular diastolic parameters were normal.  2. Right ventricular systolic function is normal. The right ventricular size is normal. There is normal pulmonary artery systolic pressure.  3. The mitral valve is grossly normal. No evidence of mitral valve regurgitation.   4. The aortic valve is grossly normal. Aortic valve regurgitation is not visualized. FINDINGS  Left Ventricle: Left ventricular ejection fraction, by estimation, is 60 to 65%. Left ventricular ejection fraction by PLAX is 63 %. The left ventricle has normal function. The left  ventricle has no regional wall motion abnormalities. The left ventricular internal cavity size was normal in size. There is mild concentric left ventricular hypertrophy. Left ventricular diastolic parameters were normal. Right Ventricle: The right ventricular size is normal. No increase in right ventricular wall thickness. Right ventricular systolic function is normal. There is normal pulmonary artery systolic pressure. The tricuspid regurgitant velocity is 2.19 m/s, and  with an assumed right atrial pressure of 10 mmHg, the estimated right ventricular systolic pressure is 74.1 mmHg. Left Atrium: Left atrial size was normal in size. Right Atrium: Right atrial size was normal in size. Pericardium: There is no evidence of pericardial effusion. Mitral Valve: The mitral valve is grossly normal. No evidence of mitral valve regurgitation. Tricuspid Valve: The tricuspid valve is normal in structure. Tricuspid valve regurgitation is trivial. Aortic Valve: The aortic valve is grossly normal. Aortic valve regurgitation is not visualized. Aortic valve mean gradient measures 2.0 mmHg. Aortic valve peak gradient measures 3.4 mmHg. Aortic valve area, by VTI measures 2.40 cm. Pulmonic Valve: The pulmonic valve was normal in structure. Pulmonic valve regurgitation is not visualized. Aorta: The aortic root is normal in size and structure. IAS/Shunts: No atrial level shunt detected by color flow Doppler.  LEFT VENTRICLE PLAX 2D LV EF:         Left            Diastology                ventricular     LV e' lateral:   7.07 cm/s                ejection        LV E/e' lateral: 8.2                fraction by     LV e' medial:    4.46 cm/s                PLAX is 63       LV E/e' medial:  12.9                %. LVIDd:         3.91 cm LVIDs:         2.59 cm LV PW:         1.56 cm LV IVS:        1.50 cm LVOT diam:     2.10 cm LV SV:         44 LV SV Index:   24 LVOT Area:     3.46 cm  RIGHT VENTRICLE RV Basal diam:  2.20 cm RV S prime:     10.90 cm/s TAPSE (M-mode): 3.4 cm LEFT ATRIUM           Index       RIGHT ATRIUM           Index LA diam:      4.10 cm 2.21 cm/m  RA Area:     13.90 cm LA Vol (A2C): 78.7 ml 42.39 ml/m RA Volume:   32.50 ml  17.51 ml/m LA Vol (A4C): 41.6 ml 22.41 ml/m  AORTIC VALVE                   PULMONIC VALVE AV Area (Vmax):    1.83 cm    PV Vmax:        0.62 m/s AV Area (Vmean):   1.96 cm    PV Peak grad:  1.5 mmHg AV Area (VTI):     2.40 cm    RVOT Peak grad: 2 mmHg AV Vmax:           92.55 cm/s AV Vmean:          60.600 cm/s AV VTI:            0.184 m AV Peak Grad:      3.4 mmHg AV Mean Grad:      2.0 mmHg LVOT Vmax:         48.80 cm/s LVOT Vmean:        34.300 cm/s LVOT VTI:          0.127 m LVOT/AV VTI ratio: 0.69  AORTA Ao Root diam: 3.40 cm MITRAL VALVE               TRICUSPID VALVE MV Area (PHT): 1.82 cm    TR Peak grad:   19.2 mmHg MV Decel Time: 417 msec    TR Vmax:        219.00 cm/s MV E velocity: 57.70 cm/s MV A velocity: 52.40 cm/s  SHUNTS MV E/A ratio:  1.10        Systemic VTI:  0.13 m                            Systemic Diam: 2.10 cm Yolonda Kida MD Electronically signed by Yolonda Kida MD Signature Date/Time: 01/25/2020/11:08:42 AM    Final        Subjective: Pt denies any complaints    Discharge Exam: Vitals:   01/25/20 1045 01/25/20 1109  BP:  (!) 167/81  Pulse:  (!) 53  Resp:  17  Temp:  98.1 F (36.7 C)  SpO2: 98% 98%   Vitals:   01/25/20 0501 01/25/20 0806 01/25/20 1045 01/25/20 1109  BP: 120/73 132/87  (!) 167/81  Pulse: (!) 56 (!) 57  (!) 53  Resp: 16 17  17   Temp: 97.7 F (36.5 C) (!) 97.4 F (36.3 C)  98.1 F (36.7 C)  TempSrc: Oral     SpO2: 97% 100% 98% 98%  Weight: 68.9 kg      Height:        General: Pt is alert, awake, not in acute distress Cardiovascular:  S1/S2 +, no rubs, no gallops Respiratory: CTA bilaterally, no wheezing, no rhonchi Abdominal: Soft, NT, ND, bowel sounds + Extremities: no edema, no cyanosis    The results of significant diagnostics from this hospitalization (including imaging, microbiology, ancillary and laboratory) are listed below for reference.     Microbiology: Recent Results (from the past 240 hour(s))  SARS Coronavirus 2 by RT PCR (hospital order, performed in [facility] hospital lab) Nasopharyngeal Nasopharyngeal Swab     Status: None   Collection Time: 01/24/20  1:01 PM   Specimen: Nasopharyngeal Swab  Result Value Ref Range Status   SARS Coronavirus 2 NEGATIVE NEGATIVE Final    Comment: (NOTE) SARS-CoV-2 target nucleic acids are NOT DETECTED.  The SARS-CoV-2 RNA is generally detectable in upper and lower respiratory specimens during the acute phase of infection. The lowest concentration of SARS-CoV-2 viral copies this assay can detect is 250 copies / mL. A negative result does not preclude SARS-CoV-2 infection and should not be used as the sole basis for treatment or other patient management decisions.  A negative result may occur with improper specimen collection / handling, submission of specimen other than nasopharyngeal swab, presence  of viral mutation(s) within the areas targeted by this assay, and inadequate number of viral copies (<250 copies / mL). A negative result must be combined with clinical observations, patient history, and epidemiological information.  Fact Sheet for Patients:   StrictlyIdeas.no  Fact Sheet for Healthcare Providers: BankingDealers.co.za  This test is not yet approved or  cleared by the [place] FDA and has been authorized for detection and/or diagnosis of SARS-CoV-2 by FDA under an Emergency Use Authorization (EUA).  This EUA  will remain in effect (meaning this test can be used) for the duration of the COVID-19 declaration under Section 564(b)(1) of the Act, 21 U.S.C. section 360bbb-3(b)(1), unless the authorization is terminated or revoked sooner.  Performed at [facility], [address] [place], [place] [address]      Labs: BNP (last 3 results) Recent Labs    01/24/20 1116  BNP 956.3*   Basic Metabolic Panel: Recent Labs  Lab 01/24/20 1116 01/25/20 0422  NA 137 138  K 4.5 4.2  CL 106 107  CO2 22 25  GLUCOSE 236* 152*  BUN 28* 25*  CREATININE 1.69* 1.49*  CALCIUM 10.0 9.9  MG 1.9  --    Liver Function Tests: Recent Labs  Lab 01/24/20 1116  AST 25  ALT 31  ALKPHOS 59  BILITOT 0.7  PROT 6.8  ALBUMIN 3.6   No results for input(s): LIPASE, AMYLASE in the last 168 hours. No results for input(s): AMMONIA in the last 168 hours. CBC: Recent Labs  Lab 01/24/20 1116 01/25/20 0422  WBC 15.0* 14.6*  NEUTROABS 7.6  --   HGB 14.0 13.8  HCT 42.5 41.0  MCV 99.3 96.9  PLT 204 203   Cardiac Enzymes: No results for input(s): CKTOTAL, CKMB, CKMBINDEX, TROPONINI in the last 168 hours. BNP: Invalid input(s): POCBNP CBG: Recent Labs  Lab 01/24/20 1808 01/24/20 2220 01/25/20 0807 01/25/20 1156  GLUCAP 172* 191* 107* 152*   D-Dimer No results for input(s): DDIMER in the last 72 hours. Hgb A1c No results for input(s): HGBA1C in the last 72 hours. Lipid Profile Recent Labs    01/25/20 0422  CHOL 90  HDL 36*  LDLCALC 27  TRIG 134  CHOLHDL 2.5   Thyroid function studies No results for input(s): TSH, T4TOTAL, T3FREE, THYROIDAB in the last 72 hours.  Invalid input(s): FREET3 Anemia work up No results for input(s): VITAMINB12, FOLATE, FERRITIN, TIBC, IRON, RETICCTPCT in the last 72 hours. Urinalysis    Component Value Date/Time   COLORURINE STRAW (A) 01/24/2020 1301   APPEARANCEUR CLEAR (A) 01/24/2020 1301   LABSPEC 1.014 01/24/2020 1301   PHURINE 6.0  01/24/2020 1301   GLUCOSEU >=500 (A) 01/24/2020 1301   HGBUR NEGATIVE 01/24/2020 1301   BILIRUBINUR NEGATIVE 01/24/2020 1301   KETONESUR NEGATIVE 01/24/2020 1301   PROTEINUR NEGATIVE 01/24/2020 1301   NITRITE NEGATIVE 01/24/2020 1301   LEUKOCYTESUR NEGATIVE 01/24/2020 1301   Sepsis Labs Invalid input(s): PROCALCITONIN,  WBC,  LACTICIDVEN Microbiology Recent Results (from the past 240 hour(s))  SARS Coronavirus 2 by RT PCR (hospital order, performed in [facility] hospital lab) Nasopharyngeal Nasopharyngeal Swab     Status: None   Collection Time: 01/24/20  1:01 PM   Specimen: Nasopharyngeal Swab  Result Value Ref Range Status   SARS Coronavirus 2 NEGATIVE NEGATIVE Final    Comment: (NOTE) SARS-CoV-2 target nucleic acids are NOT DETECTED.  The SARS-CoV-2 RNA is generally detectable in upper and lower respiratory specimens during the acute phase of infection. The  lowest concentration of SARS-CoV-2 viral copies this assay can detect is 250 copies / mL. A negative result does not preclude SARS-CoV-2 infection and should not be used as the sole basis for treatment or other patient management decisions.  A negative result may occur with improper specimen collection / handling, submission of specimen other than nasopharyngeal swab, presence of viral mutation(s) within the areas targeted by this assay, and inadequate number of viral copies (<250 copies / mL). A negative result must be combined with clinical observations, patient history, and epidemiological information.  Fact Sheet for Patients:   StrictlyIdeas.no  Fact Sheet for Healthcare Providers: BankingDealers.co.za  This test is not yet approved or  cleared by the [place] FDA and has been authorized for detection and/or diagnosis of SARS-CoV-2 by FDA under an Emergency Use Authorization (EUA).  This EUA will remain in effect (meaning this test can be used) for the duration  of the COVID-19 declaration under Section 564(b)(1) of the Act, 21 U.S.C. section 360bbb-3(b)(1), unless the authorization is terminated or revoked sooner.  Performed at [facility], [address] [place], [place] [address]      Time coordinating discharge: Over 30 minutes  SIGNED:   Wyvonnia Dusky, MD  [facility] 01/25/2020, 2:14 PM Pager   If 7PM-7AM, please contact night-coverage [email or web address]

## 2020-01-25 NOTE — Progress Notes (Signed)
OT Cancellation Note  Patient Details Name: Daniel Bray MRN: [id number] DOB: 1934/12/21   Cancelled Treatment:    Reason Eval/Treat Not Completed: OT screened, no needs identified, will sign off. Order received, chart reviewed. Pt back to baseline functional independence. No skilled OT needs identified. Will sign off. Please re-consult if additional needs arise.   Jeni Salles, MPH, MS, OTR/L ascom [phone] 01/25/20, 11:28 AM

## 2020-01-25 NOTE — Progress Notes (Signed)
*  PRELIMINARY RESULTS* Echocardiogram 2D Echocardiogram has been performed.  Sherrie Sport 01/25/2020, 10:15 AM

## 2020-01-25 NOTE — Progress Notes (Signed)
[facility]    SUBJECTIVE: Patient states to be doing reasonably well felt fine yesterday no further episodes of near syncope heart rates much improved.  Denies any chest pain no shortness of breath MRI yesterday was unremarkable reportedly scheduled to have echocardiogram today.   Vitals:   01/24/20 2220 01/24/20 2222 01/25/20 0501 01/25/20 0806  BP: 134/76 108/62 120/73 132/87  Pulse: 67 76 (!) 56 (!) 57  Resp:   16 17  Temp:   97.7 F (36.5 C) (!) 97.4 F (36.3 C)  TempSrc:   Oral   SpO2: 99% 100% 97% 100%  Weight: 68.7 kg  68.9 kg   Height:         Intake/Output Summary (Last 24 hours) at 01/25/2020 7591 Last data filed at 01/25/2020 6384 Gross per 24 hour  Intake 1508.75 ml  Output 350 ml  Net 1158.75 ml      PHYSICAL EXAM  General: Well developed, well nourished, in no acute distress HEENT:  Normocephalic and atramatic Neck:  No JVD.  Lungs: Clear bilaterally to auscultation and percussion. Heart: Bradycardic. Normal S1 and S2 without gallops or murmurs.  Abdomen: Bowel sounds are positive, abdomen soft and non-tender  Msk:  Back normal, normal gait. Normal strength and tone for age. Extremities: No clubbing, cyanosis or edema.   Neuro: Alert and oriented X 3. Psych:  Good affect, responds appropriately   LABS: Basic Metabolic Panel: Recent Labs    01/24/20 1116 01/25/20 0422  NA 137 138  K 4.5 4.2  CL 106 107  CO2 22 25  GLUCOSE 236* 152*  BUN 28* 25*  CREATININE 1.69* 1.49*  CALCIUM 10.0 9.9  MG 1.9  --    Liver Function Tests: Recent Labs    01/24/20 1116  AST 25  ALT 31  ALKPHOS 59  BILITOT 0.7  PROT 6.8  ALBUMIN 3.6   No results for input(s): LIPASE, AMYLASE in the last 72 hours. CBC: Recent Labs    01/24/20 1116 01/25/20 0422  WBC 15.0* 14.6*  NEUTROABS 7.6  --   HGB 14.0 13.8  HCT 42.5 41.0  MCV 99.3 96.9  PLT 204 203   Cardiac Enzymes: No results for input(s): CKTOTAL, CKMB, CKMBINDEX, TROPONINI in the last 72  hours. BNP: Invalid input(s): POCBNP D-Dimer: No results for input(s): DDIMER in the last 72 hours. Hemoglobin A1C: No results for input(s): HGBA1C in the last 72 hours. Fasting Lipid Panel: Recent Labs    01/25/20 0422  CHOL 90  HDL 36*  LDLCALC 27  TRIG 134  CHOLHDL 2.5   Thyroid Function Tests: No results for input(s): TSH, T4TOTAL, T3FREE, THYROIDAB in the last 72 hours.  Invalid input(s): FREET3 Anemia Panel: No results for input(s): VITAMINB12, FOLATE, FERRITIN, TIBC, IRON, RETICCTPCT in the last 72 hours.  MR BRAIN WO CONTRAST  Result Date: 01/24/2020 CLINICAL DATA:  Syncope EXAM: MRI HEAD WITHOUT CONTRAST TECHNIQUE: Multiplanar, multiecho pulse sequences of the brain and surrounding structures were obtained without intravenous contrast. COMPARISON:  None. FINDINGS: Brain: There is no acute infarction or intracranial hemorrhage. There is no intracranial mass, mass effect, or edema. There is no hydrocephalus or extra-axial fluid collection. Ventricles and sulci are within normal limits in size and configuration. Punctate focus of susceptibility in the right frontal subcortical white matter is compatible with chronic microhemorrhage or less likely mineralization. Patchy T2 hyperintensity in the supratentorial white matter is nonspecific but may reflect mild chronic microvascular ischemic changes. Vascular: Major vessel flow voids at the skull  base are preserved. Skull and upper cervical spine: Normal marrow signal is preserved. Sinuses/Orbits: Patchy mucosal thickening. There is focal opacification of a posterior right ethmoid air cell. Orbits are unremarkable. Other: Sella is partially empty.  Mastoid air cells are clear. IMPRESSION: No evidence of recent infarction, hemorrhage, or mass. Mild chronic microvascular ischemic changes. Electronically Signed   By: Macy Mis M.D.   On: 01/24/2020 21:56     Echo pending  TELEMETRY: Sinus bradycardia 50s to 60s  ASSESSMENT AND  PLAN:  Principal Problem:   Syncope Active Problems:   Type 2 diabetes mellitus with stage 3 chronic kidney disease (HCC)   Essential hypertension   Hypothyroidism   Elevated troponin   Leukocytosis   HLD (hyperlipidemia)   GERD (gastroesophageal reflux disease)   Anxiety   CAD (coronary artery disease)   Tobacco abuse   CKD (chronic kidney disease), stage IIIa   Bradycardia   Plan Bradycardic rates in the 35s and 60s continue to hold Coreg Will consider resuming Coreg but at reduced dose of 6.25 twice daily MRI unremarkable Patient reportedly to get an echocardiogram today, patient was scheduled to have outpatient procedure with his cardiologist in the upcoming months Continue hypertension management and control Maintain statin therapy for hyperlipidemia Longstanding chronic renal sufficiency stage III continue conservative management Continue diabetes management control No clear indication for permanent pacemaker In my opinion echocardiogram can be done as an in or outpatient Increase activity consider discharge soon   Yolonda Kida, MD 01/25/2020 8:22 AM

## 2020-01-26 LAB — HEMOGLOBIN A1C
Hgb A1c MFr Bld: 8.8 % — ABNORMAL HIGH (ref 4.8–5.6)
Mean Plasma Glucose: 206 mg/dL

## 2020-03-14 ENCOUNTER — Emergency Department
Admission: EM | Admit: 2020-03-14 | Discharge: 2020-03-14 | Discharge status: Against Medical Advice | Payer: Medicare HMO

## 2020-05-28 ENCOUNTER — Other Ambulatory Visit: Payer: Self-pay

## 2020-05-28 ENCOUNTER — Inpatient Hospital Stay
Admission: EM | Admit: 2020-05-28 | Discharge: 2020-05-30 | DRG: 683 | Disposition: A | Discharge status: Home / Self Care | Payer: Medicare HMO | Attending: Internal Medicine | Admitting: Internal Medicine

## 2020-05-28 ENCOUNTER — Observation Stay: Payer: Medicare HMO

## 2020-05-28 ENCOUNTER — Emergency Department: Payer: Medicare HMO

## 2020-05-28 DIAGNOSIS — E860 Dehydration: Secondary | ICD-10-CM

## 2020-05-28 DIAGNOSIS — K567 Ileus, unspecified: Secondary | ICD-10-CM | POA: Diagnosis present

## 2020-05-28 DIAGNOSIS — I248 Other forms of acute ischemic heart disease: Secondary | ICD-10-CM | POA: Diagnosis present

## 2020-05-28 DIAGNOSIS — I252 Old myocardial infarction: Secondary | ICD-10-CM

## 2020-05-28 DIAGNOSIS — N179 Acute kidney failure, unspecified: Secondary | ICD-10-CM | POA: Diagnosis not present

## 2020-05-28 DIAGNOSIS — Z888 Allergy status to other drugs, medicaments and biological substances status: Secondary | ICD-10-CM

## 2020-05-28 DIAGNOSIS — Z923 Personal history of irradiation: Secondary | ICD-10-CM

## 2020-05-28 DIAGNOSIS — N183 Chronic kidney disease, stage 3 unspecified: Secondary | ICD-10-CM | POA: Diagnosis present

## 2020-05-28 DIAGNOSIS — Z7984 Long term (current) use of oral hypoglycemic drugs: Secondary | ICD-10-CM

## 2020-05-28 DIAGNOSIS — R112 Nausea with vomiting, unspecified: Secondary | ICD-10-CM | POA: Diagnosis present

## 2020-05-28 DIAGNOSIS — R55 Syncope and collapse: Secondary | ICD-10-CM | POA: Diagnosis not present

## 2020-05-28 DIAGNOSIS — C7889 Secondary malignant neoplasm of other digestive organs: Secondary | ICD-10-CM | POA: Diagnosis present

## 2020-05-28 DIAGNOSIS — E875 Hyperkalemia: Secondary | ICD-10-CM | POA: Diagnosis present

## 2020-05-28 DIAGNOSIS — Z20822 Contact with and (suspected) exposure to covid-19: Secondary | ICD-10-CM | POA: Diagnosis present

## 2020-05-28 DIAGNOSIS — Z7982 Long term (current) use of aspirin: Secondary | ICD-10-CM

## 2020-05-28 DIAGNOSIS — I10 Essential (primary) hypertension: Secondary | ICD-10-CM | POA: Diagnosis present

## 2020-05-28 DIAGNOSIS — Z85038 Personal history of other malignant neoplasm of large intestine: Secondary | ICD-10-CM

## 2020-05-28 DIAGNOSIS — R109 Unspecified abdominal pain: Secondary | ICD-10-CM | POA: Diagnosis present

## 2020-05-28 DIAGNOSIS — D72828 Other elevated white blood cell count: Secondary | ICD-10-CM | POA: Diagnosis present

## 2020-05-28 DIAGNOSIS — F419 Anxiety disorder, unspecified: Secondary | ICD-10-CM | POA: Diagnosis present

## 2020-05-28 DIAGNOSIS — D72829 Elevated white blood cell count, unspecified: Secondary | ICD-10-CM | POA: Diagnosis present

## 2020-05-28 DIAGNOSIS — Z85528 Personal history of other malignant neoplasm of kidney: Secondary | ICD-10-CM

## 2020-05-28 DIAGNOSIS — Z905 Acquired absence of kidney: Secondary | ICD-10-CM

## 2020-05-28 DIAGNOSIS — Z800 Family history of malignant neoplasm of digestive organs: Secondary | ICD-10-CM

## 2020-05-28 DIAGNOSIS — Z720 Tobacco use: Secondary | ICD-10-CM

## 2020-05-28 DIAGNOSIS — F17210 Nicotine dependence, cigarettes, uncomplicated: Secondary | ICD-10-CM | POA: Diagnosis present

## 2020-05-28 DIAGNOSIS — Z856 Personal history of leukemia: Secondary | ICD-10-CM

## 2020-05-28 DIAGNOSIS — Z955 Presence of coronary angioplasty implant and graft: Secondary | ICD-10-CM

## 2020-05-28 DIAGNOSIS — R945 Abnormal results of liver function studies: Secondary | ICD-10-CM | POA: Diagnosis not present

## 2020-05-28 DIAGNOSIS — R7989 Other specified abnormal findings of blood chemistry: Secondary | ICD-10-CM | POA: Diagnosis present

## 2020-05-28 DIAGNOSIS — Z79890 Hormone replacement therapy (postmenopausal): Secondary | ICD-10-CM

## 2020-05-28 DIAGNOSIS — I129 Hypertensive chronic kidney disease with stage 1 through stage 4 chronic kidney disease, or unspecified chronic kidney disease: Secondary | ICD-10-CM | POA: Diagnosis present

## 2020-05-28 DIAGNOSIS — F17290 Nicotine dependence, other tobacco product, uncomplicated: Secondary | ICD-10-CM | POA: Diagnosis present

## 2020-05-28 DIAGNOSIS — E039 Hypothyroidism, unspecified: Secondary | ICD-10-CM | POA: Diagnosis present

## 2020-05-28 DIAGNOSIS — R778 Other specified abnormalities of plasma proteins: Secondary | ICD-10-CM | POA: Diagnosis present

## 2020-05-28 DIAGNOSIS — Z96641 Presence of right artificial hip joint: Secondary | ICD-10-CM | POA: Diagnosis present

## 2020-05-28 DIAGNOSIS — Z881 Allergy status to other antibiotic agents status: Secondary | ICD-10-CM

## 2020-05-28 DIAGNOSIS — K449 Diaphragmatic hernia without obstruction or gangrene: Secondary | ICD-10-CM | POA: Diagnosis present

## 2020-05-28 DIAGNOSIS — R197 Diarrhea, unspecified: Secondary | ICD-10-CM | POA: Diagnosis present

## 2020-05-28 DIAGNOSIS — E785 Hyperlipidemia, unspecified: Secondary | ICD-10-CM | POA: Diagnosis present

## 2020-05-28 DIAGNOSIS — E1122 Type 2 diabetes mellitus with diabetic chronic kidney disease: Secondary | ICD-10-CM | POA: Diagnosis present

## 2020-05-28 DIAGNOSIS — N1831 Chronic kidney disease, stage 3a: Secondary | ICD-10-CM | POA: Diagnosis present

## 2020-05-28 DIAGNOSIS — Z79899 Other long term (current) drug therapy: Secondary | ICD-10-CM

## 2020-05-28 DIAGNOSIS — E1169 Type 2 diabetes mellitus with other specified complication: Secondary | ICD-10-CM | POA: Diagnosis present

## 2020-05-28 DIAGNOSIS — I2510 Atherosclerotic heart disease of native coronary artery without angina pectoris: Secondary | ICD-10-CM | POA: Diagnosis present

## 2020-05-28 DIAGNOSIS — K219 Gastro-esophageal reflux disease without esophagitis: Secondary | ICD-10-CM | POA: Diagnosis present

## 2020-05-28 DIAGNOSIS — Z951 Presence of aortocoronary bypass graft: Secondary | ICD-10-CM

## 2020-05-28 LAB — URINALYSIS, COMPLETE (UACMP) WITH MICROSCOPIC
Bacteria, UA: NONE SEEN
Bilirubin Urine: NEGATIVE
Glucose, UA: >=500 mg/dL — AB
Hgb urine dipstick: NEGATIVE
Ketones, ur: NEGATIVE mg/dL
Leukocytes,Ua: NEGATIVE
Nitrite: NEGATIVE
Protein, ur: 30 mg/dL — AB
Specific Gravity, Urine: 1.025 (ref 1.005–1.030)
pH: 5 (ref 5.0–8.0)

## 2020-05-28 LAB — COMPREHENSIVE METABOLIC PANEL
ALT: 69 U/L — ABNORMAL HIGH (ref 0–44)
AST: 55 U/L — ABNORMAL HIGH (ref 15–41)
Albumin: 4.4 g/dL (ref 3.5–5.0)
Alkaline Phosphatase: 65 U/L (ref 38–126)
Anion gap: 11 (ref 5–15)
BUN: 49 mg/dL — ABNORMAL HIGH (ref 8–23)
CO2: 25 mmol/L (ref 22–32)
Calcium: 11.2 mg/dL — ABNORMAL HIGH (ref 8.9–10.3)
Chloride: 98 mmol/L (ref 98–111)
Creatinine, Ser: 2.12 mg/dL — ABNORMAL HIGH (ref 0.61–1.24)
GFR, Estimated: 30 mL/min — ABNORMAL LOW (ref >60)
Glucose, Bld: 250 mg/dL — ABNORMAL HIGH (ref 70–99)
Potassium: 5.3 mmol/L — ABNORMAL HIGH (ref 3.5–5.1)
Sodium: 134 mmol/L — ABNORMAL LOW (ref 135–145)
Total Bilirubin: 1.3 mg/dL — ABNORMAL HIGH (ref 0.3–1.2)
Total Protein: 8.3 g/dL — ABNORMAL HIGH (ref 6.5–8.1)

## 2020-05-28 LAB — HEPATITIS PANEL, ACUTE
HCV Ab: NONREACTIVE
Hep A IgM: NONREACTIVE
Hep B C IgM: NONREACTIVE
Hepatitis B Surface Ag: NONREACTIVE

## 2020-05-28 LAB — CBC
HCT: 51.6 % (ref 39.0–52.0)
Hemoglobin: 16.9 g/dL (ref 13.0–17.0)
MCH: 32.9 pg (ref 26.0–34.0)
MCHC: 32.8 g/dL (ref 30.0–36.0)
MCV: 100.4 fL — ABNORMAL HIGH (ref 80.0–100.0)
Platelets: 229 10*3/uL (ref 150–400)
RBC: 5.14 MIL/uL (ref 4.22–5.81)
RDW: 13.2 % (ref 11.5–15.5)
WBC: 19.2 10*3/uL — ABNORMAL HIGH (ref 4.0–10.5)
nRBC: 0 % (ref 0.0–0.2)

## 2020-05-28 LAB — TROPONIN I (HIGH SENSITIVITY)
Troponin I (High Sensitivity): 56 ng/L — ABNORMAL HIGH (ref <18)
Troponin I (High Sensitivity): 65 ng/L — ABNORMAL HIGH (ref <18)
Troponin I (High Sensitivity): 66 ng/L — ABNORMAL HIGH (ref <18)
Troponin I (High Sensitivity): 66 ng/L — ABNORMAL HIGH (ref <18)
Troponin I (High Sensitivity): 72 ng/L — ABNORMAL HIGH (ref <18)

## 2020-05-28 LAB — PROTIME-INR
INR: 1 (ref 0.8–1.2)
Prothrombin Time: 12.7 seconds (ref 11.4–15.2)

## 2020-05-28 LAB — GLUCOSE, CAPILLARY
Glucose-Capillary: 202 mg/dL — ABNORMAL HIGH (ref 70–99)
Glucose-Capillary: 202 mg/dL — ABNORMAL HIGH (ref 70–99)
Glucose-Capillary: 212 mg/dL — ABNORMAL HIGH (ref 70–99)

## 2020-05-28 LAB — RESPIRATORY PANEL BY RT PCR (FLU A&B, COVID)
Influenza A by PCR: NEGATIVE
Influenza B by PCR: NEGATIVE
SARS Coronavirus 2 by RT PCR: NEGATIVE

## 2020-05-28 LAB — APTT: aPTT: 27 seconds (ref 24–36)

## 2020-05-28 MED ORDER — LEVOTHYROXINE SODIUM 50 MCG PO TABS
75.0000 ug | ORAL_TABLET | Freq: Every day | ORAL | Status: DC
  Administered 2020-05-29: 75 ug via ORAL
  Filled 2020-05-28: qty 2

## 2020-05-28 MED ORDER — NICOTINE 21 MG/24HR TD PT24
21.0000 mg | MEDICATED_PATCH | Freq: Every day | TRANSDERMAL | Status: DC
  Filled 2020-05-28 (×2): qty 1

## 2020-05-28 MED ORDER — SODIUM ZIRCONIUM CYCLOSILICATE 5 G PO PACK: 5.0000 g | PACK | Freq: Once | ORAL | Status: DC

## 2020-05-28 MED ORDER — ASPIRIN EC 81 MG PO TBEC
81.0000 mg | DELAYED_RELEASE_TABLET | Freq: Every day | ORAL | Status: DC
  Administered 2020-05-29 – 2020-05-30 (×2): 81 mg via ORAL
  Filled 2020-05-28 (×2): qty 1

## 2020-05-28 MED ORDER — VITAMIN B-6 50 MG PO TABS
100.0000 mg | ORAL_TABLET | Freq: Every day | ORAL | Status: DC
  Filled 2020-05-28 (×2): qty 2

## 2020-05-28 MED ORDER — SODIUM CHLORIDE 0.9 % IV SOLN: INTRAVENOUS | Status: DC

## 2020-05-28 MED ORDER — SODIUM CHLORIDE 0.9 % IV SOLN
1000.0000 mL | Freq: Once | INTRAVENOUS | Status: AC
  Administered 2020-05-28: 1000 mL via INTRAVENOUS

## 2020-05-28 MED ORDER — INSULIN ASPART 100 UNIT/ML ~~LOC~~ SOLN
0.0000 [IU] | Freq: Every day | SUBCUTANEOUS | Status: DC
  Administered 2020-05-28: 2 [IU] via SUBCUTANEOUS
  Filled 2020-05-28: qty 1

## 2020-05-28 MED ORDER — GABAPENTIN 300 MG PO CAPS
300.0000 mg | ORAL_CAPSULE | Freq: Two times a day (BID) | ORAL | Status: DC
  Administered 2020-05-28 – 2020-05-29 (×2): 300 mg via ORAL
  Filled 2020-05-28 (×3): qty 1

## 2020-05-28 MED ORDER — HYDRALAZINE HCL 20 MG/ML IJ SOLN
5.0000 mg | INTRAMUSCULAR | Status: DC | PRN
  Administered 2020-05-29: 5 mg via INTRAVENOUS
  Filled 2020-05-28: qty 1

## 2020-05-28 MED ORDER — HEPARIN SODIUM (PORCINE) 5000 UNIT/ML IJ SOLN
5000.0000 [IU] | Freq: Three times a day (TID) | INTRAMUSCULAR | Status: DC
  Administered 2020-05-28 – 2020-05-30 (×7): 5000 [IU] via SUBCUTANEOUS
  Filled 2020-05-28 (×7): qty 1

## 2020-05-28 MED ORDER — ONDANSETRON HCL 4 MG/2ML IJ SOLN: 4.0000 mg | Freq: Three times a day (TID) | INTRAMUSCULAR | Status: DC | PRN

## 2020-05-28 MED ORDER — ROSUVASTATIN CALCIUM 10 MG PO TABS
40.0000 mg | ORAL_TABLET | Freq: Every day | ORAL | Status: DC
  Administered 2020-05-29 – 2020-05-30 (×2): 40 mg via ORAL
  Filled 2020-05-28: qty 2
  Filled 2020-05-28 (×2): qty 4
  Filled 2020-05-28: qty 2

## 2020-05-28 MED ORDER — PANTOPRAZOLE SODIUM 40 MG PO TBEC
40.0000 mg | DELAYED_RELEASE_TABLET | Freq: Every day | ORAL | Status: DC
  Administered 2020-05-29 – 2020-05-30 (×2): 40 mg via ORAL
  Filled 2020-05-28 (×2): qty 1

## 2020-05-28 MED ORDER — INSULIN ASPART 100 UNIT/ML ~~LOC~~ SOLN
0.0000 [IU] | Freq: Three times a day (TID) | SUBCUTANEOUS | Status: DC
  Administered 2020-05-28 (×2): 3 [IU] via SUBCUTANEOUS
  Administered 2020-05-29 (×2): 1 [IU] via SUBCUTANEOUS
  Administered 2020-05-29 – 2020-05-30 (×2): 2 [IU] via SUBCUTANEOUS
  Administered 2020-05-30: 1 [IU] via SUBCUTANEOUS
  Filled 2020-05-28 (×7): qty 1

## 2020-05-28 MED ORDER — ALPRAZOLAM 0.25 MG PO TABS: 0.1250 mg | ORAL_TABLET | Freq: Every evening | ORAL | Status: DC | PRN

## 2020-05-28 NOTE — ED Notes (Signed)
Pt assisted up to toilet at this time.

## 2020-05-28 NOTE — ED Provider Notes (Signed)
[facility] Emergency Department Provider Note   ____________________________________________    I have reviewed the triage vital signs and the nursing notes.   HISTORY  Chief Complaint Loss of Consciousness and Fatigue     HPI Daniel Bray is a 85 y.o. male with a history of renal cell carcinoma, diabetes, hypertension, MI, triple bypass who presents with complaints of abdominal pain, nausea, belching and dizziness.  Patient reports yesterday after eating dinner he started to feel "gassy ".  He reports he had frequent episodes of belching for most of the night and also reports significant cramping pain in his left lower quadrant which is somewhat intermittent but at times is severe.  Denies hematuria.  No history of the same.  Has a history of multiple abdominal surgeries.  Denies chest pain, denies shortness of breath, denies palpitations.  No fevers or chills.  Has had 3 Covid vaccines  Past Medical History:  Diagnosis Date   Cancer ([place])    kidney   Cancer ([place])    pancreas   Cancer ([place])    colon    Diabetes mellitus without complication ([place])    Hyperlipidemia    Hypertension    Myocardial infarction ([place])    Streptococcal infection    Strep Bovis   Thyroid disease     Patient Active Problem List   Diagnosis Date Noted   Acute renal failure superimposed on stage 3a chronic kidney disease ([place]) 05/28/2020   Elevated troponin 01/24/2020   Leukocytosis 01/24/2020   HLD (hyperlipidemia) 01/24/2020   GERD (gastroesophageal reflux disease) 01/24/2020   Anxiety 01/24/2020   CAD (coronary artery disease) 01/24/2020   Tobacco abuse 01/24/2020   CKD (chronic kidney disease), stage IIIa 01/24/2020   Bradycardia 01/24/2020   Laceration of forehead    Syncope 10/12/2019   Type 2 diabetes mellitus with stage 3 chronic kidney disease ([place]) 10/12/2019   Essential hypertension 10/12/2019   History of renal cell  carcinoma 10/12/2019   Partial small bowel obstruction ([place]) 10/12/2019   Hypothyroidism 10/12/2019    Past Surgical History:  Procedure Laterality Date   CARDIAC ELECTROPHYSIOLOGY STUDY AND ABLATION     CARDIAC SURGERY     CORONARY ANGIOPLASTY WITH STENT PLACEMENT     CORONARY ARTERY BYPASS GRAFT     x3   SPLENECTOMY      Prior to Admission medications   Medication Sig Start Date End Date Taking? Authorizing Provider  azelastine (ASTELIN) 0.1 % nasal spray Place 2 sprays into both nostrils 2 (two) times daily. 05/17/20  Yes [provider]  Dulaglutide (TRULICITY) 1.5 RR/1.1AF SOPN Inject 1.5 mg into the skin every Saturday.    Yes [provider]  empagliflozin (JARDIANCE) 10 MG TABS tablet Take 10 mg by mouth daily.   Yes [provider]  glipiZIDE (GLUCOTROL) 10 MG tablet Take 10 mg by mouth 2 (two) times daily with a meal.    Yes [provider]  glipiZIDE (GLUCOTROL) 10 MG tablet Take 10 mg by mouth 2 (two) times daily before a meal.   Yes [provider]  levothyroxine (SYNTHROID) 75 MCG tablet Take 75 mcg by mouth daily before breakfast.    Yes [provider]  lisinopril (ZESTRIL) 20 MG tablet Take 20 mg by mouth 2 (two) times daily.    Yes [provider]  omeprazole (PRILOSEC) 40 MG capsule Take 40 mg by mouth daily.    Yes [provider]  rosuvastatin (CRESTOR) 40 MG  tablet Take 40 mg by mouth daily.   Yes [provider]  acetaminophen (TYLENOL) 650 MG CR tablet Take 650 mg by mouth 2 (two) times daily.    [provider]  ALPRAZolam Duanne Moron) 0.25 MG tablet Take 0.125 mg by mouth at bedtime as needed for sleep.     [provider]  aspirin EC 81 MG tablet Take 81 mg by mouth daily.    [provider]  cholecalciferol (VITAMIN D) 1000 units tablet Take 2,500 Units by mouth daily.     [provider]  DM-APAP-CPM (CORICIDIN HBP) 10-325-2 MG TABS Take 10  mg by mouth 2 (two) times daily.    [provider]  gabapentin (NEURONTIN) 300 MG capsule Take 300 mg by mouth at bedtime.    [provider]  hydrochlorothiazide (HYDRODIURIL) 25 MG tablet Take 25 mg by mouth 2 (two) times daily. When needed    [provider]  pyridoxine (B-6) 100 MG tablet Take 100 mg by mouth daily.    [provider]     Allergies Levofloxacin, Levaquin [levofloxacin in d5w], and Januvia [sitagliptin]  Family History  Family history unknown: Yes    Social History Social History   Tobacco Use   Smoking status: Current Every Day Smoker    Packs/day: 1.00    Types: Cigars   Smokeless tobacco: Current User    Types: Chew  Vaping Use   Vaping Use: Never used  Substance Use Topics   Alcohol use: No   Drug use: No    Review of Systems  Constitutional: No fever/chills Eyes: No visual changes.  ENT: No sore throat. Cardiovascular: No chest pain Respiratory: Denies shortness of breath.  No pleurisy Gastrointestinal: As above Genitourinary: Negative for dysuria. Musculoskeletal: Negative for back pain. Skin: Negative for rash. Neurological: Negative for headaches   ____________________________________________   PHYSICAL EXAM:  VITAL SIGNS: ED Triage Vitals  Enc Vitals Group     BP 05/28/20 0806 (!) 152/86     Pulse Rate 05/28/20 0806 86     Resp 05/28/20 0806 14     Temp 05/28/20 0806 98.2 F (36.8 C)     Temp Source 05/28/20 0806 Oral     SpO2 05/28/20 0806 98 %     Weight 05/28/20 0807 66.7 kg (147 lb)     Height 05/28/20 0807 1.778 m (5\' 10" )     Head Circumference --      Peak Flow --      Pain Score 05/28/20 0807 0     Pain Loc --      Pain Edu? --      Excl. in [place]? --     Constitutional: Alert and oriented.   Nose: No congestion/rhinnorhea. Mouth/Throat: Mucous membranes are moist.   Neck:  Painless ROM Cardiovascular: Normal rate, regular rhythm. Grossly normal heart sounds.  Good  peripheral circulation. Respiratory: Normal respiratory effort.  No retractions. Lungs CTAB. Gastrointestinal: Soft, mild tenderness in the left lower quadrant. No distention.  No CVA tenderness. Genitourinary: deferred Musculoskeletal: No lower extremity tenderness nor edema.  Warm and well perfused Neurologic:  Normal speech and language. No gross focal neurologic deficits are appreciated.  Skin:  Skin is warm, dry and intact. No rash noted. Psychiatric: Mood and affect are normal. Speech and behavior are normal.  ____________________________________________   LABS (all labs ordered are listed, but only abnormal results are displayed)  Labs Reviewed  CBC - Abnormal; Notable for the following components:  Result Value   WBC 19.2 (*)    MCV 100.4 (*)    All other components within normal limits  URINALYSIS, COMPLETE (UACMP) WITH MICROSCOPIC - Abnormal; Notable for the following components:   Color, Urine YELLOW (*)    APPearance CLEAR (*)    Glucose, UA >=500 (*)    Protein, ur 30 (*)    All other components within normal limits  COMPREHENSIVE METABOLIC PANEL - Abnormal; Notable for the following components:   Sodium 134 (*)    Potassium 5.3 (*)    Glucose, Bld 250 (*)    BUN 49 (*)    Creatinine, Ser 2.12 (*)    Calcium 11.2 (*)    Total Protein 8.3 (*)    AST 55 (*)    ALT 69 (*)    Total Bilirubin 1.3 (*)    GFR, Estimated 30 (*)    All other components within normal limits  TROPONIN I (HIGH SENSITIVITY) - Abnormal; Notable for the following components:   Troponin I (High Sensitivity) 56 (*)    All other components within normal limits  RESPIRATORY PANEL BY RT PCR (FLU A&B, COVID)  PROTIME-INR  APTT  TROPONIN I (HIGH SENSITIVITY)   ____________________________________________  EKG  ED ECG REPORT I, Lavonia Drafts, the attending physician, personally viewed and interpreted this ECG.  Date: 05/28/2020  Rhythm: normal sinus rhythm QRS Axis:  normal Intervals: normal ST/T Wave abnormalities: Mild elevations of V1 V2, ST depressions 2 3 aVF Narrative Interpretation: Compared with EKG from June 22, elevations in V1 V2 seem old however depressions may be new.  Discussed with Dr. Nehemiah Massed as detailed below  ____________________________________________  RADIOLOGY  CT abdomen pelvis reviewed by me, no evidence of small bowel obstruction ____________________________________________   PROCEDURES  Procedure(s) performed: No  Procedures   Critical Care performed: No ____________________________________________   INITIAL IMPRESSION / ASSESSMENT AND PLAN / ED COURSE  Pertinent labs & imaging results that were available during my care of the patient were reviewed by me and considered in my medical decision making (see chart for details).  Patient presents with symptoms as described above.  No chest pain.  Intermittent occasional left lower quadrant pain which he describes it as moderate to severe.  He also notes feeling gassy and having belching  Initial EKG concerning,  discussed with Dr. Nehemiah Massed of cardiology who reviewed today's EKG and prior EKGs as well.  He feels not consistent with STEMI but some concerning ST depressions inferiorly which I concur with, recommends continuing work-up for abdominal pain, adding troponin as well.  Again patient has no chest pain or epigastric discomfort.  Offered patient pain medication but is currently asymptomatic, feeling better after receiving IV Zofran via EMS.  CT scan is overall reassuring. Discussed with hospitalist for admission given elevated BUN creatinine and history of nephrectomy. Suspect foodborne illness or GI illness causing dehydration, patient is receiving IV fluids.    ____________________________________________   FINAL CLINICAL IMPRESSION(S) / ED DIAGNOSES  Final diagnoses:  Dehydration  Syncope and collapse  Acute kidney injury [facility])        Note:  This  document was prepared using Dragon voice recognition software and may include unintentional dictation errors.   Lavonia Drafts, MD 05/28/20 1110

## 2020-05-28 NOTE — H&P (Addendum)
History and Physical    Daniel Bray FGH:[id number] DOB: 06-Jul-1935 DOA: 05/28/2020  Referring MD/NP/PA:   PCP: Hortencia Pilar, MD   Patient coming from:  The patient is coming from home.  At baseline, pt is independent for most of ADL.        Chief Complaint: Nausea, vomiting, diarrhea abdominal pain, syncope  HPI: Daniel Bray is a 85 y.o. male with medical history significant of hypertension, hyperlipidemia, diabetes mellitus, GERD, hypothyroidism, anxiety, CAD, CBAG, myocardial infarction, partial small bowel obstruction, CKD stage III, tobacco abuse, renal cell carcinoma (s/p of left nephrectomy), pancreatic cancer (s/p radiation therapy), colon cancer (s/p of subtotal colectomy), who presents with nausea vomiting abdominal pain, and syncope.  Pt state that his symptoms started last night.  He has nausea, vomiting, diarrhea, frequent episodes of belching and abdominal pain.  Abdominal pain is located in left lower quadrant, intermittent, moderate, cramping pain. His AP has improved currently.  Patient has vomited twice with nonbiliouse and nonbloody vomiting.  Patient has had 4 times of watery diarrhea today.  Denies any chest pain, shortness breath, cough, fever or chills.  No symptoms of UTI. On the way to facility with wife pt had episode of LOC in car.  Denies unilateral numbness or tingling in extremities.  No facial droop or slurred speech. Has had 3 Covid vaccines  ED Course: pt was found to have WBC 19.2, negative COVID-19 PCR, potassium 5.3, Ca 11.2, worsening renal function, temperature normal, blood pressure 136/80, heart rate 79, RR 17, oxygen saturation 98% on room air.  Chest x-ray negative.  Patient is placed on [facility] for observation.  CT-abd/pelvis: 1. 2.4 cm rounded mass seen in the region of the inferior portion of pancreatic head which appears to be decreased in size compared to prior exam. 2. Status post left nephrectomy. No definite evidence of  recurrent mass seen in the left renal fossa. 3. Status post splenectomy and subtotal colectomy. 4. Moderate gastric distention is noted. Mildly dilated small bowel loops are noted in the left upper quadrant which may represent ileus. 5. Small fat containing left inguinal hernia. 6. Aortic atherosclerosis. 7. Aortic Atherosclerosis (ICD10-I70.0).   Review of Systems:   General: no fevers, chills, no body weight gain, has poor appetite, has fatigue HEENT: no blurry vision, hearing changes or sore throat Respiratory: no dyspnea, coughing, wheezing CV: no chest pain, no palpitations GI: has nausea, vomiting, abdominal pain, diarrhea, no constipation GU: no dysuria, burning on urination, increased urinary frequency, hematuria  Ext: no leg edema Neuro: no unilateral weakness, numbness, or tingling, no vision change or hearing loss. Has LOC Skin: no rash, no skin tear. MSK: No muscle spasm, no deformity, no limitation of range of movement in spin Heme: No easy bruising.  Travel history: No recent long distant travel.  Allergy:  Allergies  Allergen Reactions  . Levofloxacin Hives and Other (See Comments)  . Levaquin [Levofloxacin In D5w]     Weakness, low blood pressure  . Januvia [Sitagliptin] Rash    Past Medical History:  Diagnosis Date  . Cancer ([employer])    kidney  . Cancer ([place])    pancreas  . Cancer ([place])    colon   . Diabetes mellitus without complication ([place])   . Hyperlipidemia   . Hypertension   . Myocardial infarction ([place])   . Streptococcal infection    Strep Bovis  . Thyroid disease     Past Surgical History:  Procedure Laterality Date  . CARDIAC ELECTROPHYSIOLOGY  STUDY AND ABLATION    . CARDIAC SURGERY    . CORONARY ANGIOPLASTY WITH STENT PLACEMENT    . CORONARY ARTERY BYPASS GRAFT     x3  . SPLENECTOMY      Social History:  reports that he has been smoking cigars. He has been smoking about 1.00 pack per day. His smokeless tobacco use includes chew. He  reports that he does not drink alcohol and does not use drugs.  Family History:  Family History  Problem Relation Age of Onset  . Pancreatic cancer Mother   . Liver cancer Mother      Prior to Admission medications   Medication Sig Start Date End Date Taking? Authorizing Provider  acetaminophen (TYLENOL) 650 MG CR tablet Take 650 mg by mouth 2 (two) times daily.    [provider]  ALPRAZolam Duanne Moron) 0.25 MG tablet Take 0.125 mg by mouth at bedtime as needed for sleep.     [provider]  aspirin EC 81 MG tablet Take 81 mg by mouth daily.    [provider]  carvedilol (COREG) 6.25 MG tablet Take 1 tablet (6.25 mg total) by mouth 2 (two) times daily. 01/25/20 02/24/20  Wyvonnia Dusky, MD  cholecalciferol (VITAMIN D) 1000 units tablet Take 2,500 Units by mouth daily.     [provider]  DM-APAP-CPM (CORICIDIN HBP) 10-325-2 MG TABS Take 10 mg by mouth 2 (two) times daily.    [provider]  Dulaglutide (TRULICITY) 7.37 TG/6.2IR SOPN Inject 0.75 mg into the skin once a week.    [provider]  empagliflozin (JARDIANCE) 10 MG TABS tablet Take 10 mg by mouth daily.    [provider]  gabapentin (NEURONTIN) 300 MG capsule Take 300 mg by mouth at bedtime.    [provider]  glipiZIDE (GLUCOTROL) 5 MG tablet Take 10 mg by mouth 2 (two) times daily with a meal.     [provider]  hydrALAZINE (APRESOLINE) 25 MG tablet Take 25 mg by mouth 2 (two) times daily. Patient not taking: Reported on 01/24/2020    [provider]  hydrochlorothiazide (HYDRODIURIL) 25 MG tablet Take 25 mg by mouth 2 (two) times daily. When needed    [provider]  levothyroxine (SYNTHROID) 75 MCG tablet Take 75 mcg by mouth daily before breakfast.     [provider]  lisinopril (PRINIVIL,ZESTRIL) 5 MG tablet Take 20 mg by mouth 2 (two) times daily.     [provider]  omeprazole (PRILOSEC) 40 MG  capsule Take 20 mg by mouth daily.     [provider]  pyridoxine (B-6) 100 MG tablet Take 100 mg by mouth daily.    [provider]  rosuvastatin (CRESTOR) 40 MG tablet Take 40 mg by mouth daily.    [provider]    Physical Exam: Vitals:   05/28/20 0806 05/28/20 0807 05/28/20 0900  BP: (!) 152/86  136/80  Pulse: 86  79  Resp: 14  17  Temp: 98.2 F (36.8 C)    TempSrc: Oral    SpO2: 98%    Weight:  66.7 kg   Height:  5\' 10"  (1.778 m)    General: Not in acute distress HEENT:       Eyes: PERRL, EOMI, no scleral icterus.       ENT: No discharge from the ears and nose, no pharynx injection, no tonsillar enlargement.        Neck: No JVD, no bruit, no  mass felt. Heme: No neck lymph node enlargement. Cardiac: S1/S2, RRR, No murmurs, No gallops or rubs. Respiratory: No rales, wheezing, rhonchi or rubs. GI: Soft, nondistended, has mild tenderness in LLQ, no rebound pain, no organomegaly, BS present. GU: No hematuria Ext: No pitting leg edema bilaterally. 1+DP/PT pulse bilaterally. Musculoskeletal: No joint deformities, No joint redness or warmth, no limitation of ROM in spin. Skin: No rashes.  Neuro: Alert, oriented X3, cranial nerves II-XII grossly intact, moves all extremities normally. Psych: Patient is not psychotic, no suicidal or hemocidal ideation.  Labs on Admission: I have personally reviewed following labs and imaging studies  CBC: Recent Labs  Lab 05/28/20 0806  WBC 19.2*  HGB 16.9  HCT 51.6  MCV 100.4*  PLT 176   Basic Metabolic Panel: Recent Labs  Lab 05/28/20 0806  NA 134*  K 5.3*  CL 98  CO2 25  GLUCOSE 250*  BUN 49*  CREATININE 2.12*  CALCIUM 11.2*   GFR: Estimated Creatinine Clearance: 25.3 mL/min (A) (by C-G formula based on SCr of 2.12 mg/dL (H)). Liver Function Tests: Recent Labs  Lab 05/28/20 0806  AST 55*  ALT 69*  ALKPHOS 65  BILITOT 1.3*  PROT 8.3*  ALBUMIN 4.4   No results for input(s): LIPASE,  AMYLASE in the last 168 hours. No results for input(s): AMMONIA in the last 168 hours. Coagulation Profile: No results for input(s): INR, PROTIME in the last 168 hours. Cardiac Enzymes: No results for input(s): CKTOTAL, CKMB, CKMBINDEX, TROPONINI in the last 168 hours. BNP (last 3 results) No results for input(s): PROBNP in the last 8760 hours. HbA1C: No results for input(s): HGBA1C in the last 72 hours. CBG: Recent Labs  Lab 05/28/20 1152  GLUCAP 202*   Lipid Profile: No results for input(s): CHOL, HDL, LDLCALC, TRIG, CHOLHDL, LDLDIRECT in the last 72 hours. Thyroid Function Tests: No results for input(s): TSH, T4TOTAL, FREET4, T3FREE, THYROIDAB in the last 72 hours. Anemia Panel: No results for input(s): VITAMINB12, FOLATE, FERRITIN, TIBC, IRON, RETICCTPCT in the last 72 hours. Urine analysis:    Component Value Date/Time   COLORURINE YELLOW (A) 05/28/2020 0922   APPEARANCEUR CLEAR (A) 05/28/2020 0922   LABSPEC 1.025 05/28/2020 0922   PHURINE 5.0 05/28/2020 0922   GLUCOSEU >=500 (A) 05/28/2020 0922   HGBUR NEGATIVE 05/28/2020 0922   BILIRUBINUR NEGATIVE 05/28/2020 0922   KETONESUR NEGATIVE 05/28/2020 0922   PROTEINUR 30 (A) 05/28/2020 0922   NITRITE NEGATIVE 05/28/2020 0922   LEUKOCYTESUR NEGATIVE 05/28/2020 0922   Sepsis Labs: @LABRCNTIP (procalcitonin:4,lacticidven:4) ) Recent Results (from the past 240 hour(s))  Respiratory Panel by RT PCR (Flu A&B, Covid) - Nasopharyngeal Swab     Status: None   Collection Time: 05/28/20  9:27 AM   Specimen: Nasopharyngeal Swab  Result Value Ref Range Status   SARS Coronavirus 2 by RT PCR NEGATIVE NEGATIVE Final    Comment: (NOTE) SARS-CoV-2 target nucleic acids are NOT DETECTED.  The SARS-CoV-2 RNA is generally detectable in upper respiratoy specimens during the acute phase of infection. The lowest concentration of SARS-CoV-2 viral copies this assay can detect is 131 copies/mL. A negative result does not preclude  SARS-Cov-2 infection and should not be used as the sole basis for treatment or other patient management decisions. A negative result may occur with  improper specimen collection/handling, submission of specimen other than nasopharyngeal swab, presence of viral mutation(s) within the areas targeted by this assay, and inadequate number of viral copies (<131 copies/mL). A negative result must be combined with  clinical observations, patient history, and epidemiological information. The expected result is Negative.  Fact Sheet for Patients:  PinkCheek.be  Fact Sheet for Healthcare Providers:  GravelBags.it  This test is no t yet approved or cleared by the [place] FDA and  has been authorized for detection and/or diagnosis of SARS-CoV-2 by FDA under an Emergency Use Authorization (EUA). This EUA will remain  in effect (meaning this test can be used) for the duration of the COVID-19 declaration under Section 564(b)(1) of the Act, 21 U.S.C. section 360bbb-3(b)(1), unless the authorization is terminated or revoked sooner.     Influenza A by PCR NEGATIVE NEGATIVE Final   Influenza B by PCR NEGATIVE NEGATIVE Final    Comment: (NOTE) The Xpert Xpress SARS-CoV-2/FLU/RSV assay is intended as an aid in  the diagnosis of influenza from Nasopharyngeal swab specimens and  should not be used as a sole basis for treatment. Nasal washings and  aspirates are unacceptable for Xpert Xpress SARS-CoV-2/FLU/RSV  testing.  Fact Sheet for Patients: PinkCheek.be  Fact Sheet for Healthcare Providers: GravelBags.it  This test is not yet approved or cleared by the [place] FDA and  has been authorized for detection and/or diagnosis of SARS-CoV-2 by  FDA under an Emergency Use Authorization (EUA). This EUA will remain  in effect (meaning this test can be used) for the duration of the   Covid-19 declaration under Section 564(b)(1) of the Act, 21  U.S.C. section 360bbb-3(b)(1), unless the authorization is  terminated or revoked. Performed at [facility], [address] [place], [place] [address]      Radiological Exams on Admission: CT ABDOMEN PELVIS WO CONTRAST  Result Date: 05/28/2020 CLINICAL DATA:  Acute left lower quadrant abdominal pain. EXAM: CT ABDOMEN AND PELVIS WITHOUT CONTRAST TECHNIQUE: Multidetector CT imaging of the abdomen and pelvis was performed following the standard protocol without IV contrast. COMPARISON:  October 12, 2019. FINDINGS: Lower chest: No acute abnormality. Hepatobiliary: No focal liver abnormality is seen. No gallstones, gallbladder wall thickening, or biliary dilatation. Pancreas: There is been fatty replacement of most of the pancreas. 2.4 x 2.2 cm rounded mass is seen in the region of the inferior portion of the pancreatic head which is decreased in size compared to prior exam. Spleen: Status post splenectomy. Adrenals/Urinary Tract: Status post left nephrectomy. No definite evidence of recurrent mass seen in the left renal fossa. Right adrenal gland appears normal. Mild cortical scarring is seen involving the right kidney. No hydronephrosis or renal obstruction is noted. No renal or ureteral calculi are noted. Urinary bladder is unremarkable. Stomach/Bowel: Moderate gastric distention is noted. Mildly dilated small bowel loops are noted in the left upper quadrant which may represent ileus. Patient appears to be status post subtotal colectomy. Vascular/Lymphatic: Aortic atherosclerosis. No enlarged abdominal or pelvic lymph nodes. Reproductive: Prostate is unremarkable. Other: Small fat containing left inguinal hernia is noted. No ascites is noted. Musculoskeletal: Status post right hip arthroplasty. No acute osseous abnormality is noted. IMPRESSION: 1. 2.4 cm rounded mass seen in the region of the inferior portion of pancreatic head which  appears to be decreased in size compared to prior exam. 2. Status post left nephrectomy. No definite evidence of recurrent mass seen in the left renal fossa. 3. Status post splenectomy and subtotal colectomy. 4. Moderate gastric distention is noted. Mildly dilated small bowel loops are noted in the left upper quadrant which may represent ileus. 5. Small fat containing left inguinal hernia. 6. Aortic atherosclerosis. Aortic Atherosclerosis (ICD10-I70.0). Electronically Signed  By: Marijo Conception M.D.   On: 05/28/2020 09:36   DG Chest Port 1 View  Result Date: 05/28/2020 CLINICAL DATA:  Weakness. EXAM: PORTABLE CHEST 1 VIEW COMPARISON:  September 07, 2019. FINDINGS: The heart size and mediastinal contours are within normal limits. Both lungs are clear. No pneumothorax or pleural effusion is noted. Status post coronary bypass graft. The visualized skeletal structures are unremarkable. IMPRESSION: No active disease. Electronically Signed   By: Marijo Conception M.D.   On: 05/28/2020 09:26     EKG: I have personally reviewed.  Sinus rhythm, QTC 442, LAD, PVC, poor R wave progression, LAE, poor R wave progression, ST elevation in V1-V3 to (patient has previous ST elevation, but today his ST elevations is slightly worse), ST depression in inferior leads.   Assessment/Plan Principal Problem:   Acute renal failure superimposed on stage 3a chronic kidney disease (HCC) Active Problems:   Syncope   Type 2 diabetes mellitus with stage 3 chronic kidney disease (HCC)   Essential hypertension   Hypothyroidism   Elevated troponin   Leukocytosis   HLD (hyperlipidemia)   GERD (gastroesophageal reflux disease)   Anxiety   CAD (coronary artery disease)   Tobacco abuse   Nausea vomiting and diarrhea   Abdominal pain   Hyperkalemia   Hypercalcemia   Abnormal LFTs   Acute renal failure superimposed on stage 3a chronic kidney disease ([place]): Baseline Cre is 1.49 on 01/25/20, pt's Cre is 2.12 and BUN 49 on  admission. Likely due to prerenal secondary to dehydration and continuation of ACEI and diuretics. Pt has hs of left nephrectomy  - place in [place] for observation - IVF: 1 L normal saline, followed by 100 cc/h - Follow up renal function by BMP - Avoid using renal toxic medications, hypotension and contrast dye (or carefully use) - Hold HCTZ and lisinopril   Syncope: Likely due to dehydration, orthostatic status secondary to nausea vomiting and diarrhea.  No focal deficit on physical examination -Check orthostatic vital sign -IV fluid as above -PT/OT -Follow-up CT of head  Type 2 diabetes mellitus with stage 3 chronic kidney disease ([place]): Recent A1c 8.8, poorly controlled.  Patient taking Trulicity, Jardiance, glipizide -SSI  Essential hypertension -Continue Coreg -IV hydralazine as needed -Hold HCTZ and lisinopril due to worsening renal function  Hypothyroidism -Synthroid  Elevated troponin and hx of CAD: s/p of CABG. patient does not have any chest pain or shortness breath.  Likely due to demand ischemia.  Initial EKG concerning due to the elevation in V1-V3. EDP, Dr.Kinner " discussed with Dr. Nehemiah Massed of cardiology who reviewed today's EKG and prior EKGs as well.  He feels not consistent with STEMI but some concerning ST depressions inferiorly which I concur with, recommends continuing work-up for abdominal pain, adding troponin as well".  -Trend troponin -Aspirin, Crestor, Coreg -Check A1c, FLP -Repeat EKG in morning  Leukocytosis: WBC 19.2, patient seems to have chronic leukocytosis.  No source of infection identified.   -Follow-up with CBC  HLD (hyperlipidemia) -Crestor  GERD (gastroesophageal reflux disease) -Protonix  Tobacco abuse: -Nicotine patch  Nausea & vomiting diarrhea and AP: Patient may have viral gastroenteritis.  CT scan showed possible ileus otherwise not impressive. -Check C. difficile PCR and GI pathogen panel -As needed Zofran -IV fluid as  well  Hyperkalemia -IVF as above -f/u by BMP  Hypercalcemia: Ca 11.2, most likely due to dehydration -IVF as above -Hold Vitamin D supplement  Abnormal LFTs: ALP 65, AST 55, ALT 69,  total bilirubin 1.3, mild abnormalities.  No right upper quadrant abdominal pain. -Avoid using Tylenol -f/u with LFT    DVT ppx: SQ Heparin   Code Status: Full code Family Communication:    Yes, patient's wife   at bed side Disposition Plan:  Anticipate discharge back to previous environment Consults called: ED physician discussed with Dr. Nehemiah Massed of cardiology Admission status: Med-surg bed for obs  Status is: Observation  The patient remains OBS appropriate and will d/c before 2 midnights.  Dispo: The patient is from: Home              Anticipated d/c is to: Home              Anticipated d/c date is: 1 day              Patient currently is not medically stable to d/c.            Date of Service 05/28/2020    Ivor Costa [facility]   If 7PM-7AM, please contact night-coverage [email or web address] 05/28/2020, 12:15 PM

## 2020-05-28 NOTE — ED Triage Notes (Signed)
Pt arrives via ems from home. Ems reports nausea and emesis after eating last night. On the way to facility with wfe pt had episode of LOC in car and called ems. Hx of tripple bypass. LLQ pain last night but denies pain at this time, denies SOB. EMS reports elevation in v1, v2, v3 hx of elevation but worse that previously. A&o x 4 on arrival. MD in room to assess.   20 g left ac.  4mg  zofran given by ems  cbg 171 bp- 161/98 HR 80

## 2020-05-29 ENCOUNTER — Encounter: Payer: Self-pay | Admitting: Internal Medicine

## 2020-05-29 DIAGNOSIS — K219 Gastro-esophageal reflux disease without esophagitis: Secondary | ICD-10-CM | POA: Diagnosis present

## 2020-05-29 DIAGNOSIS — I252 Old myocardial infarction: Secondary | ICD-10-CM | POA: Diagnosis not present

## 2020-05-29 DIAGNOSIS — F17210 Nicotine dependence, cigarettes, uncomplicated: Secondary | ICD-10-CM | POA: Diagnosis present

## 2020-05-29 DIAGNOSIS — I248 Other forms of acute ischemic heart disease: Secondary | ICD-10-CM | POA: Diagnosis present

## 2020-05-29 DIAGNOSIS — D72828 Other elevated white blood cell count: Secondary | ICD-10-CM | POA: Diagnosis present

## 2020-05-29 DIAGNOSIS — Z20822 Contact with and (suspected) exposure to covid-19: Secondary | ICD-10-CM | POA: Diagnosis present

## 2020-05-29 DIAGNOSIS — F17290 Nicotine dependence, other tobacco product, uncomplicated: Secondary | ICD-10-CM | POA: Diagnosis present

## 2020-05-29 DIAGNOSIS — E1122 Type 2 diabetes mellitus with diabetic chronic kidney disease: Secondary | ICD-10-CM | POA: Diagnosis present

## 2020-05-29 DIAGNOSIS — K449 Diaphragmatic hernia without obstruction or gangrene: Secondary | ICD-10-CM | POA: Diagnosis present

## 2020-05-29 DIAGNOSIS — K567 Ileus, unspecified: Secondary | ICD-10-CM | POA: Diagnosis present

## 2020-05-29 DIAGNOSIS — Z96641 Presence of right artificial hip joint: Secondary | ICD-10-CM | POA: Diagnosis present

## 2020-05-29 DIAGNOSIS — F419 Anxiety disorder, unspecified: Secondary | ICD-10-CM | POA: Diagnosis present

## 2020-05-29 DIAGNOSIS — R55 Syncope and collapse: Secondary | ICD-10-CM | POA: Diagnosis present

## 2020-05-29 DIAGNOSIS — C7889 Secondary malignant neoplasm of other digestive organs: Secondary | ICD-10-CM | POA: Diagnosis present

## 2020-05-29 DIAGNOSIS — N179 Acute kidney failure, unspecified: Secondary | ICD-10-CM | POA: Diagnosis present

## 2020-05-29 DIAGNOSIS — E039 Hypothyroidism, unspecified: Secondary | ICD-10-CM | POA: Diagnosis present

## 2020-05-29 DIAGNOSIS — I129 Hypertensive chronic kidney disease with stage 1 through stage 4 chronic kidney disease, or unspecified chronic kidney disease: Secondary | ICD-10-CM | POA: Diagnosis present

## 2020-05-29 DIAGNOSIS — E785 Hyperlipidemia, unspecified: Secondary | ICD-10-CM | POA: Diagnosis present

## 2020-05-29 DIAGNOSIS — I2510 Atherosclerotic heart disease of native coronary artery without angina pectoris: Secondary | ICD-10-CM | POA: Diagnosis present

## 2020-05-29 DIAGNOSIS — R7989 Other specified abnormal findings of blood chemistry: Secondary | ICD-10-CM | POA: Diagnosis present

## 2020-05-29 DIAGNOSIS — E875 Hyperkalemia: Secondary | ICD-10-CM | POA: Diagnosis present

## 2020-05-29 DIAGNOSIS — N1831 Chronic kidney disease, stage 3a: Secondary | ICD-10-CM | POA: Diagnosis not present

## 2020-05-29 DIAGNOSIS — E860 Dehydration: Secondary | ICD-10-CM | POA: Diagnosis present

## 2020-05-29 LAB — LIPID PANEL
Cholesterol: 75 mg/dL (ref 0–200)
HDL: 42 mg/dL (ref >40)
LDL Cholesterol: 13 mg/dL (ref 0–99)
Total CHOL/HDL Ratio: 1.8 RATIO
Triglycerides: 101 mg/dL (ref <150)
VLDL: 20 mg/dL (ref 0–40)

## 2020-05-29 LAB — CBC
HCT: 42.2 % (ref 39.0–52.0)
Hemoglobin: 14.1 g/dL (ref 13.0–17.0)
MCH: 33.6 pg (ref 26.0–34.0)
MCHC: 33.4 g/dL (ref 30.0–36.0)
MCV: 100.5 fL — ABNORMAL HIGH (ref 80.0–100.0)
Platelets: 171 10*3/uL (ref 150–400)
RBC: 4.2 MIL/uL — ABNORMAL LOW (ref 4.22–5.81)
RDW: 13.5 % (ref 11.5–15.5)
WBC: 30.2 10*3/uL — ABNORMAL HIGH (ref 4.0–10.5)
nRBC: 0 % (ref 0.0–0.2)

## 2020-05-29 LAB — COMPREHENSIVE METABOLIC PANEL
ALT: 41 U/L (ref 0–44)
AST: 29 U/L (ref 15–41)
Albumin: 3.1 g/dL — ABNORMAL LOW (ref 3.5–5.0)
Alkaline Phosphatase: 49 U/L (ref 38–126)
Anion gap: 4 — ABNORMAL LOW (ref 5–15)
BUN: 52 mg/dL — ABNORMAL HIGH (ref 8–23)
CO2: 23 mmol/L (ref 22–32)
Calcium: 9.9 mg/dL (ref 8.9–10.3)
Chloride: 109 mmol/L (ref 98–111)
Creatinine, Ser: 1.91 mg/dL — ABNORMAL HIGH (ref 0.61–1.24)
GFR, Estimated: 35 mL/min — ABNORMAL LOW (ref >60)
Glucose, Bld: 170 mg/dL — ABNORMAL HIGH (ref 70–99)
Potassium: 5.1 mmol/L (ref 3.5–5.1)
Sodium: 136 mmol/L (ref 135–145)
Total Bilirubin: 1 mg/dL (ref 0.3–1.2)
Total Protein: 6.1 g/dL — ABNORMAL LOW (ref 6.5–8.1)

## 2020-05-29 LAB — HEMOGLOBIN A1C
Hgb A1c MFr Bld: 8.8 % — ABNORMAL HIGH (ref 4.8–5.6)
Mean Plasma Glucose: 206 mg/dL

## 2020-05-29 LAB — GLUCOSE, CAPILLARY
Glucose-Capillary: 144 mg/dL — ABNORMAL HIGH (ref 70–99)
Glucose-Capillary: 166 mg/dL — ABNORMAL HIGH (ref 70–99)
Glucose-Capillary: 143 mg/dL — ABNORMAL HIGH (ref 70–99)
Glucose-Capillary: 127 mg/dL — ABNORMAL HIGH (ref 70–99)

## 2020-05-29 LAB — TROPONIN I (HIGH SENSITIVITY): Troponin I (High Sensitivity): 71 ng/L — ABNORMAL HIGH (ref <18)

## 2020-05-29 MED ORDER — LEVOTHYROXINE SODIUM 50 MCG PO TABS
75.0000 ug | ORAL_TABLET | Freq: Every day | ORAL | Status: DC
  Administered 2020-05-30: 75 ug via ORAL
  Filled 2020-05-29: qty 2

## 2020-05-29 NOTE — Evaluation (Signed)
Occupational Therapy Evaluation Patient Details Name: Daniel Bray MRN: [id number] DOB: 05/14/1935 Today's Date: 05/29/2020    History of Present Illness Daniel Bray is an 85 y.o. male presenting to the ED with nausea, vomiting, diarrhea, abdominal pain, and syncope. PMHx includes HTN, MI, CAD, CBAG, partial SBO, CKD stage III, tobacco abuse, renal cell carcinoma (s/p of left nephrectomy), pancreatic cancer (s/p radiation therapy), colon cancer (s/p of subtotal colectomy).   Clinical Impression   Daniel Bray presented to the ED yesterday evening. Following a reportedly very large meal at home, pt began having nausea, vomiting, and abdominal pain, and experienced a LOC in the car as his wife was driving him to the hospital. After arriving at hospital, he had several episodes of diarrhea. Daniel Bray states that he is feeling fine this morning, however, and during today's OT evaluation, he reports no pain and no dizziness. The pt is IND in mobility w/o use of AD, and UE and LE strength is within functional limits. Prior to this admission, pt was IND in all ADL/IADL. 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 He lives in a [place], handicapped accessible home with a ramp to enter. Pt states he has no concerns re: performing any functional mobility tasks required of him. No further OT services necessary at this time.     Follow Up Recommendations  No OT follow up    Equipment Recommendations  None recommended by OT    Recommendations for Other Services       Precautions / Restrictions Precautions Precautions: None Restrictions Weight Bearing Restrictions: No      Mobility Bed Mobility Overal bed mobility: Independent                  Transfers Overall transfer level: Independent                    Balance Overall balance assessment: Independent                                          ADL either performed or assessed with clinical judgement   ADL Overall ADL's : Independent                                             Vision Patient Visual Report: No change from baseline       Perception     Praxis      Pertinent Vitals/Pain Pain Assessment: No/denies pain     Hand Dominance     Extremity/Trunk Assessment Upper Extremity Assessment Upper Extremity Assessment: Overall WFL for tasks assessed   Lower Extremity Assessment Lower Extremity Assessment: Overall WFL for tasks assessed       Communication Communication Communication: No difficulties   Cognition Arousal/Alertness: Awake/alert Behavior During Therapy: WFL for tasks assessed/performed Overall Cognitive Status: Within Functional Limits for tasks assessed                                     General Comments       Exercises     Shoulder Instructions  Home Living Family/patient expects to be discharged to:: Private residence Living Arrangements: Spouse/significant other Available Help at Discharge: Family;Available 24 hours/day Type of Home: House Home Access: Ramped entrance     Home Layout: One level     Bathroom Shower/Tub: Teacher, early years/pre: Handicapped height     Home Equipment: Grab bars - tub/shower;Cane - quad;Cane - single point;Walker - 2 wheels;Walker - 4 wheels;Bedside commode   Additional Comments: Pt does not use any AD/DME at home at present      Prior Functioning/Environment Level of Independence: Independent        Comments: Pt Ind and active, walks 2 miles per day without an AD, no falls in the previous 6 months, IND with ADLs, drives, does yard work, Sports administrator a Ship broker Problem List: Decreased strength      OT Treatment/Interventions:      OT Goals(Current goals can be found in the care plan section) Acute Rehab OT Goals Patient Stated Goal:  to go home OT Goal Formulation: With patient/family Time For Goal Achievement: 06/12/20 Potential to Achieve Goals: Good  OT Frequency:     Barriers to D/C:            Co-evaluation              AM-PAC OT "6 Clicks" Daily Activity     Outcome Measure Help from another person eating meals?: None Help from another person taking care of personal grooming?: None Help from another person toileting, which includes using toliet, bedpan, or urinal?: None Help from another person bathing (including washing, rinsing, drying)?: None Help from another person to put on and taking off regular upper body clothing?: None Help from another person to put on and taking off regular lower body clothing?: None 6 Click Score: 24   End of Session Nurse Communication: Other (comment) (IV bleeding)  Activity Tolerance: Patient tolerated treatment well Patient left: in bed;with call bell/phone within reach;with family/visitor present  OT Visit Diagnosis: Muscle weakness (generalized) (M62.81)                Time: 2947-6546 OT Time Calculation (min): 9 min Charges:  OT General Charges $OT Visit: 1 Visit OT Evaluation $OT Eval Low Complexity: 1 Low  Daniel Lobo, PhD, [place], OTR/L ascom [phone] 05/29/20, 12:05 PM

## 2020-05-29 NOTE — Evaluation (Signed)
Physical Therapy Evaluation Patient Details Name: Daniel Bray MRN: [id number] DOB: July 06, 1935 Today's Date: 05/29/2020   History of Present Illness  Daniel Bray is an 85 y.o. male presenting to the ED with nausea, vomiting, diarrhea, abdominal pain, and syncope. PMHx includes HTN, MI, CAD, CBAG, partial SBO, CKD stage III, tobacco abuse, renal cell carcinoma (s/p of left nephrectomy), pancreatic cancer (s/p radiation therapy), colon cancer (s/p of subtotal colectomy).  Clinical Impression  Patient received on stretcher, daughter present. He reports he is feeling good, wants to go home. He performed bed mobility and transfers independently. Ambulated 200 feet without ad and min guard. Slight drifting right to left when out in hallway. He is overall safe. Patient does not require continued PT at this time as he appears to be at baseline.      Follow Up Recommendations No PT follow up    Equipment Recommendations  None recommended by PT    Recommendations for Other Services       Precautions / Restrictions Precautions Precautions: None Restrictions Weight Bearing Restrictions: No      Mobility  Bed Mobility Overal bed mobility: Independent               Patient Response: Cooperative  Transfers Overall transfer level: Independent                  Ambulation/Gait Ambulation/Gait assistance: Min guard Gait Distance (Feet): 200 Feet Assistive device: None Gait Pattern/deviations: Drifts right/left Gait velocity: WNL   General Gait Details: generally safe with mobility, reports no difficulties  Stairs            Wheelchair Mobility    Modified Rankin (Stroke Patients Only)       Balance Overall balance assessment: Mild deficits observed, not formally tested                                           Pertinent Vitals/Pain Pain Assessment: Faces Faces Pain Scale: Hurts a little bit Pain Location: L LE with  walking Pain Descriptors / Indicators: Discomfort Pain Intervention(s): Monitored during session    Home Living Family/patient expects to be discharged to:: Private residence Living Arrangements: Spouse/significant other Available Help at Discharge: Family;Available 24 hours/day Type of Home: House Home Access: Ramped entrance     Home Layout: One level Home Equipment: Grab bars - tub/shower;Cane - quad;Cane - single point;Walker - 2 wheels;Walker - 4 wheels;Bedside commode Additional Comments: Pt does not use any AD/DME at home at present    Prior Function Level of Independence: Independent         Comments: Pt Ind and active, walks 2 miles per day without an AD, no falls in the previous 6 months, IND with ADLs, drives, does yard work, 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, newspaper        Extremity/Trunk Assessment   Upper Extremity Assessment Upper Extremity Assessment: Defer to OT evaluation    Lower Extremity Assessment Lower Extremity Assessment: Overall WFL for tasks assessed    Cervical / Trunk Assessment Cervical / Trunk Assessment: Normal  Communication   Communication: No difficulties  Cognition Arousal/Alertness: Awake/alert Behavior During Therapy: WFL for tasks assessed/performed Overall Cognitive Status: Within Functional Limits for tasks assessed  General Comments      Exercises     Assessment/Plan    PT Assessment Patent does not need any further PT services  PT Problem List         PT Treatment Interventions      PT Goals (Current goals can be found in the Care Plan section)  Acute Rehab PT Goals Patient Stated Goal: to go home PT Goal Formulation: With patient Time For Goal Achievement: 06/01/20 Potential to Achieve Goals: Good    Frequency     Barriers to discharge        Co-evaluation               AM-PAC PT "6 Clicks" Mobility  Outcome  Measure Help needed turning from your back to your side while in a flat bed without using bedrails?: None Help needed moving from lying on your back to sitting on the side of a flat bed without using bedrails?: None Help needed moving to and from a bed to a chair (including a wheelchair)?: None Help needed standing up from a chair using your arms (e.g., wheelchair or bedside chair)?: None Help needed to walk in hospital room?: None Help needed climbing 3-5 steps with a railing? : None 6 Click Score: 24    End of Session Equipment Utilized During Treatment: Gait belt Activity Tolerance: Patient tolerated treatment well Patient left: in bed;with family/visitor present Nurse Communication: Mobility status      Time: 3559-7416 PT Time Calculation (min) (ACUTE ONLY): 14 min   Charges:   PT Evaluation $PT Eval Low Complexity: 1 Low          Brandye Inthavong, PT, GCS 05/29/20,12:14 PM

## 2020-05-29 NOTE — Progress Notes (Signed)
[facility] Progress Note  Patient: Daniel Bray    UKG:[id number]  DOA: 05/28/2020     Date of Service: the patient was seen and examined on 05/29/2020  Brief hospital course: Past medical history of HTN, CAD, CABG, renal cell cancer, recurrent SBO, CKD 3B, pancreatic cancer SP radiation, colon cancer SP colectomy.  Presents with acute kidney injury on chronic kidney disease Currently plan is continue IV fluids.  Assessment and Plan: Acute renal failure superimposed on stage 3a chronic kidney disease ([place]): Baseline Cre is 1.49 on 01/25/20, pt's Cre is 2.12 and BUN 49 on admission.  Likely due to prerenal secondary to dehydration and continuation of ACEI and diuretics. Pt has hs of left nephrectomy - Hold HCTZ and lisinopril -Continue IV fluids.  Nausea vomiting and diarrhea. Pancreatic mass. Patient has renal cell carcinoma with mets likely mets to pancreas with SP radiation therapy. Suspect that his presentation with nausea and vomiting associated with chronic ileus plus combination of poor tolerance of large meal. CT scan shows significantly large stomach. Diarrhea is difficult to explain but currently patient does not have any diarrhea right now.   Syncope:  Likely due to dehydration, orthostatic status secondary to nausea vomiting and diarrhea.  No focal deficit on physical examination CT head negative. No focal deficit. Orthostatic vitals normal as well. PT states no follow-up.  Type 2 diabetes mellitus with stage 3 chronic kidney disease ([place]):  Recent A1c 8.8, poorly controlled.  Patient taking Trulicity, Jardiance, glipizide -SSI  Essential hypertension -Continue Coreg -IV hydralazine as needed -Hold HCTZ and lisinopril due to worsening renal function  Hypothyroidism -Synthroid  Elevated troponin and hx of CAD: s/p of CABG. patient does not have any chest pain or shortness breath.  Likely due to demand ischemia.  Initial EKG concerning due to the  elevation in V1-V3. EDP, Dr.Kinner "discussed with Dr. Nehemiah Massed of cardiology who reviewed today's EKG and prior EKGs as well. He feels not consistent with STEMI but some concerning ST depressions inferiorly which I concur with, recommends continuing work-up for abdominal pain, adding troponin as well".  Troponins negative.  No chest pain.  Likely associated with his hiatal hernia.  Leukocytosis: WBC 19.2, patient seems to have chronic leukocytosis.  No source of infection identified.   -Follow-up with CBC  HLD (hyperlipidemia) -Crestor  GERD (gastroesophageal reflux disease) -Protonix  Tobacco abuse: -Nicotine patch  Hyperkalemia -IVF as above -f/u by BMP  Hypercalcemia: Ca 11.2, most likely due to dehydration -IVF as above -Hold Vitamin D supplement  Abnormal LFTs: ALP 65, AST 55, ALT 69, total bilirubin 1.3, mild abnormalities.  No right upper quadrant abdominal pain. -Avoid using Tylenol -f/u with LFT  Diet: Regular diet DVT Prophylaxis:   heparin injection 5,000 Units Start: 05/28/20 1400    Advance goals of care discussion: Full code  Family Communication: family was present at bedside, at the time of interview.  The pt provided permission to discuss medical plan with the family. Opportunity was given to ask question and all questions were answered satisfactorily.   Disposition:  Status is: Inpatient  Remains inpatient appropriate because:IV treatments appropriate due to intensity of illness or inability to take PO   Dispo: The patient is from: Home              Anticipated d/c is to: Home              Anticipated d/c date is: 1 day  Patient currently is not medically stable to d/c.  Subjective: Minimal oral intake.  No nausea no vomiting.  Passing gas.  No BM.  Physical Exam:  General: Appear in mild distress, no Rash; Oral Mucosa Clear, moist. no Abnormal Neck Mass Or lumps, Conjunctiva normal  Cardiovascular: S1 and S2 Present, no  Murmur, Respiratory: good respiratory effort, Bilateral Air entry present and CTA, no Crackles, no wheezes Abdomen: Bowel Sound present, Soft and mild tenderness Extremities: no Pedal edema Neurology: alert and oriented to time, place, and person affect appropriate. no new focal deficit Gait not checked due to patient safety concerns  Vitals:   05/29/20 1038 05/29/20 1208 05/29/20 1520 05/29/20 1626  BP: (!) 145/67  (!) 152/82 (!) 156/80  Pulse:    75  Resp: 18   16  Temp: 97.7 F (36.5 C)   98.1 F (36.7 C)  TempSrc: Oral   Oral  SpO2:  92%  96%  Weight:      Height:        Intake/Output Summary (Last 24 hours) at 05/29/2020 1857 Last data filed at 05/29/2020 1700 Gross per 24 hour  Intake 2751.41 ml  Output 350 ml  Net 2401.41 ml   Filed Weights   05/28/20 0807  Weight: 66.7 kg    Data Reviewed: I have personally reviewed and interpreted daily labs, tele strips, imagings as discussed above. I reviewed all nursing notes, pharmacy notes, vitals, pertinent old records I have discussed plan of care as described above with RN and patient/family.  CBC: Recent Labs  Lab 05/28/20 0806 05/29/20 0352  WBC 19.2* 30.2*  HGB 16.9 14.1  HCT 51.6 42.2  MCV 100.4* 100.5*  PLT 229 973   Basic Metabolic Panel: Recent Labs  Lab 05/28/20 0806 05/29/20 0352  NA 134* 136  K 5.3* 5.1  CL 98 109  CO2 25 23  GLUCOSE 250* 170*  BUN 49* 52*  CREATININE 2.12* 1.91*  CALCIUM 11.2* 9.9    Studies: No results found.  Scheduled Meds: . aspirin EC  81 mg Oral Daily  . gabapentin  300 mg Oral BID  . heparin  5,000 Units Subcutaneous Q8H  . insulin aspart  0-5 Units Subcutaneous QHS  . insulin aspart  0-9 Units Subcutaneous TID WC  . [START ON 05/30/2020] levothyroxine  75 mcg Oral QAC breakfast  . nicotine  21 mg Transdermal Daily  . pantoprazole  40 mg Oral Daily  . rosuvastatin  40 mg Oral Daily   Continuous Infusions: . sodium chloride 100 mL/hr at 05/29/20 1757    PRN Meds: ALPRAZolam, hydrALAZINE, ondansetron (ZOFRAN) IV  Time spent: 35 minutes  Author: Berle Mull, MD [facility] 05/29/2020 6:57 PM  To reach On-call, see care teams to locate the attending and reach out via [email or web address]. Between 7PM-7AM, please contact night-coverage If you still have difficulty reaching the attending provider, please page the [facility] (Director on Call) for [facility] on amion for assistance.

## 2020-05-30 LAB — COMPREHENSIVE METABOLIC PANEL
ALT: 31 U/L (ref 0–44)
AST: 20 U/L (ref 15–41)
Albumin: 3 g/dL — ABNORMAL LOW (ref 3.5–5.0)
Alkaline Phosphatase: 51 U/L (ref 38–126)
Anion gap: 8 (ref 5–15)
BUN: 34 mg/dL — ABNORMAL HIGH (ref 8–23)
CO2: 18 mmol/L — ABNORMAL LOW (ref 22–32)
Calcium: 9.8 mg/dL (ref 8.9–10.3)
Chloride: 107 mmol/L (ref 98–111)
Creatinine, Ser: 1.61 mg/dL — ABNORMAL HIGH (ref 0.61–1.24)
GFR, Estimated: 42 mL/min — ABNORMAL LOW (ref >60)
Glucose, Bld: 137 mg/dL — ABNORMAL HIGH (ref 70–99)
Potassium: 4.7 mmol/L (ref 3.5–5.1)
Sodium: 133 mmol/L — ABNORMAL LOW (ref 135–145)
Total Bilirubin: 1 mg/dL (ref 0.3–1.2)
Total Protein: 6 g/dL — ABNORMAL LOW (ref 6.5–8.1)

## 2020-05-30 LAB — CBC WITH DIFFERENTIAL/PLATELET
Abs Immature Granulocytes: 0.19 10*3/uL — ABNORMAL HIGH (ref 0.00–0.07)
Basophils Absolute: 0.1 10*3/uL (ref 0.0–0.1)
Basophils Relative: 0 %
Eosinophils Absolute: 0.1 10*3/uL (ref 0.0–0.5)
Eosinophils Relative: 0 %
HCT: 39.8 % (ref 39.0–52.0)
Hemoglobin: 13 g/dL (ref 13.0–17.0)
Immature Granulocytes: 1 %
Lymphocytes Relative: 20 %
Lymphs Abs: 5.4 10*3/uL — ABNORMAL HIGH (ref 0.7–4.0)
MCH: 32.9 pg (ref 26.0–34.0)
MCHC: 32.7 g/dL (ref 30.0–36.0)
MCV: 100.8 fL — ABNORMAL HIGH (ref 80.0–100.0)
Monocytes Absolute: 2.4 10*3/uL — ABNORMAL HIGH (ref 0.1–1.0)
Monocytes Relative: 9 %
Neutro Abs: 19 10*3/uL — ABNORMAL HIGH (ref 1.7–7.7)
Neutrophils Relative %: 70 %
Platelets: 159 10*3/uL (ref 150–400)
RBC: 3.95 MIL/uL — ABNORMAL LOW (ref 4.22–5.81)
RDW: 13.4 % (ref 11.5–15.5)
Smear Review: NORMAL
WBC: 27.1 10*3/uL — ABNORMAL HIGH (ref 4.0–10.5)
nRBC: 0 % (ref 0.0–0.2)

## 2020-05-30 LAB — GLUCOSE, CAPILLARY
Glucose-Capillary: 122 mg/dL — ABNORMAL HIGH (ref 70–99)
Glucose-Capillary: 198 mg/dL — ABNORMAL HIGH (ref 70–99)

## 2020-05-30 MED ORDER — HYDRALAZINE HCL 10 MG PO TABS: 10.0000 mg | ORAL_TABLET | Freq: Four times a day (QID) | ORAL | 0 refills | Status: DC | PRN

## 2020-05-30 MED ORDER — GABAPENTIN 300 MG PO CAPS: 300.0000 mg | ORAL_CAPSULE | Freq: Every day | ORAL | Status: DC

## 2020-05-30 MED ORDER — SIMETHICONE 80 MG PO TABS: 1.0000 | ORAL_TABLET | Freq: Four times a day (QID) | ORAL | 0 refills | Status: DC | PRN

## 2020-05-30 NOTE — Discharge Summary (Signed)
[facility] Discharge Summary   Patient: Daniel Bray OIZ:[id number]  PCP: Hortencia Pilar, MD  Date of admission: 05/28/2020   Date of discharge: 05/30/2020     Discharge Diagnoses:  Principal Problem:   Acute renal failure superimposed on stage 3a chronic kidney disease ([place]) Active Problems:   Syncope   Type 2 diabetes mellitus with stage 3 chronic kidney disease ([place])   Essential hypertension   Hypothyroidism   Elevated troponin   Leukocytosis   HLD (hyperlipidemia)   GERD (gastroesophageal reflux disease)   Anxiety   CAD (coronary artery disease)   Tobacco abuse   Nausea vomiting and diarrhea   Abdominal pain   Hyperkalemia   Hypercalcemia   Abnormal LFTs   Ileus ([place])  Admitted From: home Disposition:  Home   Recommendations for Outpatient Follow-up:  1. PCP: please follow up with PCP in 1 week 2. Follow up with Gastroenterology in 1 month.  3. Follow up LABS/TEST:  none   Follow-up Information    Hortencia Pilar, MD. Go on 06/01/2020.   Specialty: Family Medicine Contact information: [place] [address] [phone]              Diet recommendation: Cardiac diet  Activity: The patient is advised to gradually reintroduce usual activities, as tolerated  Discharge Condition: stable  Code Status: Full code   History of present illness: As per the H and P dictated on admission, "Daniel Bray is a 85 y.o. male with medical history significant of hypertension, hyperlipidemia, diabetes mellitus, GERD, hypothyroidism, anxiety, CAD, CBAG, myocardial infarction, partial small bowel obstruction, CKD stage III, tobacco abuse, renal cell carcinoma (s/p of left nephrectomy), pancreatic cancer (s/p radiation therapy), colon cancer (s/p of subtotal colectomy), who presents with nausea vomiting abdominal pain, and syncope.  Pt state that his symptoms started last night.  He has nausea, vomiting, diarrhea, frequent episodes of  belching and abdominal pain.  Abdominal pain is located in left lower quadrant, intermittent, moderate, cramping pain. His AP has improved currently.  Patient has vomited twice with nonbiliouse and nonbloody vomiting.  Patient has had 4 times of watery diarrhea today.  Denies any chest pain, shortness breath, cough, fever or chills.  No symptoms of UTI. On the way to facility with wife pt had episode of LOC in car.  Denies unilateral numbness or tingling in extremities.  No facial droop or slurred speech. Has had 3 Covid vaccines"  Hospital Course:  Summary of his active problems in the hospital is as following. Acute renal failure superimposed on stage 3a chronic kidney disease (Daniel Bray): Baseline Cre is1.49 on 01/25/20, pt's Cre is2.12 and BUN 49on admission.  Likely due to prerenal secondary to dehydration and continuation of ACEI anddiuretics.  Pt has hs ofleft nephrectomy StopHCTZ and lisinopril Treated with IV fluids.  Nausea vomiting and diarrhea. Pancreatic mass. Patient has renal cell carcinoma with mets likely mets to pancreas with SP radiation therapy. Suspect that his presentation with nausea and vomiting associated with chronic ileus plus combination of poor tolerance of large meal. CT scan shows significantly large stomach. Diarrhea is difficult to explain but currently patient does not have any diarrhea right now.  Syncope: Likely due to dehydration, orthostatic status secondary to nausea vomiting and diarrhea. No focal deficit on physical examination CT head negative. No focal deficit. Orthostatic vitals normal as well. PT states no follow-up.  Type 2 diabetes mellitus with stage 3 chronic kidney disease (Daniel Bray): Recent A1c 8.8,poorly controlled.  Patient  taking Trulicity, Jardiance, glipizide  Essential hypertension Continue Coreg PO hydralazine as needed Stop HCTZ and lisinopril due to worsening renal function  Hypothyroidism Synthroid  Elevated  troponinand hx of CAD: S/p of CABG Patient does not have any chest pain or shortness breath. Likely due to demand ischemia. Initial EKG concerningdue tothe elevation in V1-V3.EDP, Dr.Kinner "discussed with Dr. Nehemiah Massed of cardiology who reviewed today's EKG and prior EKGs as well. He feels not consistent with STEMI but some concerning ST depressions inferiorly which I concur with, recommends continuing work-up for abdominal pain, adding troponin as well". Troponins negative.  No chest pain.  Likely associated with his hiatal hernia.  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 No source of infection identified.   HLD (hyperlipidemia) -Crestor  GERD (gastroesophageal reflux disease) -Protonix  Tobacco abuse: -Nicotine patch  Hyperkalemia Resolved  -IVF as above  Hypercalcemia: Ca 11.2,most likely due to dehydration -IVF as above -Hold Vitamin Dsupplement  Abnormal LFTs: ALP 65, AST 55, ALT 69, total bilirubin 1.3, mild abnormalities.  No right upper quadrant abdominal pain.  Patient was ambulatory without any assistance. On the day of the discharge the patient's vitals were stable, and no other acute medical condition were reported by patient. The patient was felt safe to be discharge at Home with no therapy needed on discharge.  Consultants: none Procedures: none  Discharge Exam: General: Appear in no distress, no Rash; Oral Mucosa Clear, moist. no Abnormal Neck Mass Or lumps, Conjunctiva normal  Cardiovascular: S1 and S2 Present, no Murmur Respiratory: good respiratory effort, Bilateral Air entry present and CTA, no Crackles, no wheezes Abdomen: Bowel Sound present, Soft and no tenderness Extremities: no Pedal edema Neurology: alert and oriented to time, place, and person affect appropriate. no new focal deficit  [facility] Weights   05/28/20 0807  Weight: 66.7 kg   Vitals:   05/30/20 0822 05/30/20  1131  BP: (!) 149/88 (!) 156/74  Pulse: 92 91  Resp: 16   Temp: 98.5 F (36.9 C)   SpO2: 96% 95%    DISCHARGE MEDICATION: Allergies as of 05/30/2020      Reactions   Levofloxacin Hives, Other (See Comments)   Levaquin [levofloxacin In D5w]    Weakness, low blood pressure   Januvia [sitagliptin] Rash      Medication List    STOP taking these medications   hydrochlorothiazide 25 MG tablet Commonly known as: HYDRODIURIL   lisinopril 20 MG tablet Commonly known as: ZESTRIL     TAKE these medications   acetaminophen 650 MG CR tablet Commonly known as: TYLENOL Take 650 mg by mouth 2 (two) times daily.   ALPRAZolam 0.25 MG tablet Commonly known as: XANAX Take 0.125 mg by mouth at bedtime as needed for sleep.   aspirin EC 81 MG tablet Take 81 mg by mouth daily.   azelastine 0.1 % nasal spray Commonly known as: ASTELIN Place 2 sprays into both nostrils 2 (two) times daily.   cholecalciferol 1000 units tablet Commonly known as: VITAMIN D Take 2,500 Units by mouth daily.   Coricidin HBP 10-325-2 MG Tabs Generic drug: DM-APAP-CPM Take 10 mg by mouth 2 (two) times daily as needed (cold symptoms).   gabapentin 300 MG capsule Commonly known as: NEURONTIN Take 300 mg by mouth 2 (two) times daily.   glipiZIDE 10 MG tablet Commonly known as: GLUCOTROL Take 10 mg by mouth 2 (two) times daily with a meal.   hydrALAZINE 10 MG tablet Commonly known  as: APRESOLINE Take 1 tablet (10 mg total) by mouth 4 (four) times daily as needed (SBP >170).   Jardiance 10 MG Tabs tablet Generic drug: empagliflozin Take 10 mg by mouth daily.   levothyroxine 75 MCG tablet Commonly known as: SYNTHROID Take 75 mcg by mouth daily before breakfast.   omeprazole 40 MG capsule Commonly known as: PRILOSEC Take 40 mg by mouth daily.   pyridoxine 100 MG tablet Commonly known as: B-6 Take 100 mg by mouth daily.   rosuvastatin 40 MG tablet Commonly known as: CRESTOR Take 40 mg by mouth  daily.   Simethicone 80 MG Tabs Take 1 tablet (80 mg total) by mouth 4 (four) times daily as needed.   Trulicity 1.5 DJ/4.9FW Sopn Generic drug: Dulaglutide Inject 1.5 mg into the skin every Saturday.      Allergies  Allergen Reactions  . Levofloxacin Hives and Other (See Comments)  . Levaquin [Levofloxacin In D5w]     Weakness, low blood pressure  . Januvia [Sitagliptin] Rash   Discharge Instructions    Diet - low sodium heart healthy   Complete by: As directed    Increase activity slowly   Complete by: As directed       The results of significant diagnostics from this hospitalization (including imaging, microbiology, ancillary and laboratory) are listed below for reference.    Significant Diagnostic Studies: CT ABDOMEN PELVIS WO CONTRAST  Result Date: 05/28/2020 CLINICAL DATA:  Acute left lower quadrant abdominal pain. EXAM: CT ABDOMEN AND PELVIS WITHOUT CONTRAST TECHNIQUE: Multidetector CT imaging of the abdomen and pelvis was performed following the standard protocol without IV contrast. COMPARISON:  October 12, 2019. FINDINGS: Lower chest: No acute abnormality. Hepatobiliary: No focal liver abnormality is seen. No gallstones, gallbladder wall thickening, or biliary dilatation. Pancreas: There is been fatty replacement of most of the pancreas. 2.4 x 2.2 cm rounded mass is seen in the region of the inferior portion of the pancreatic head which is decreased in size compared to prior exam. Spleen: Status post splenectomy. Adrenals/Urinary Tract: Status post left nephrectomy. No definite evidence of recurrent mass seen in the left renal fossa. Right adrenal gland appears normal. Mild cortical scarring is seen involving the right kidney. No hydronephrosis or renal obstruction is noted. No renal or ureteral calculi are noted. Urinary bladder is unremarkable. Stomach/Bowel: Moderate gastric distention is noted. Mildly dilated small bowel loops are noted in the left upper quadrant which may  represent ileus. Patient appears to be status post subtotal colectomy. Vascular/Lymphatic: Aortic atherosclerosis. No enlarged abdominal or pelvic lymph nodes. Reproductive: Prostate is unremarkable. Other: Small fat containing left inguinal hernia is noted. No ascites is noted. Musculoskeletal: Status post right hip arthroplasty. No acute osseous abnormality is noted. IMPRESSION: 1. 2.4 cm rounded mass seen in the region of the inferior portion of pancreatic head which appears to be decreased in size compared to prior exam. 2. Status post left nephrectomy. No definite evidence of recurrent mass seen in the left renal fossa. 3. Status post splenectomy and subtotal colectomy. 4. Moderate gastric distention is noted. Mildly dilated small bowel loops are noted in the left upper quadrant which may represent ileus. 5. Small fat containing left inguinal hernia. 6. Aortic atherosclerosis. Aortic Atherosclerosis (ICD10-I70.0). Electronically Signed   By: Marijo Conception M.D.   On: 05/28/2020 09:36   CT HEAD WO CONTRAST  Result Date: 05/28/2020 CLINICAL DATA:  85 year old male with syncope EXAM: CT HEAD WITHOUT CONTRAST TECHNIQUE: Contiguous axial images were obtained from the  base of the skull through the vertex without intravenous contrast. COMPARISON:  01/24/2020 MR, 10/12/2019 CT and prior studies FINDINGS: Brain: No evidence of acute infarction, hemorrhage, hydrocephalus, extra-axial collection or mass lesion/mass effect. Chronic small-vessel white matter ischemic changes are again noted. Vascular: Carotid atherosclerotic calcifications are noted. Skull: Normal. Negative for fracture or focal lesion. Sinuses/Orbits: No acute finding. An opacified posterior RIGHT ethmoid air cell is unchanged. Other: None. IMPRESSION: 1. No evidence of acute intracranial abnormality. 2. Chronic small-vessel white matter ischemic changes. Electronically Signed   By: Margarette Canada M.D.   On: 05/28/2020 13:34   DG Chest Port 1  View  Result Date: 05/28/2020 CLINICAL DATA:  Weakness. EXAM: PORTABLE CHEST 1 VIEW COMPARISON:  September 07, 2019. FINDINGS: The heart size and mediastinal contours are within normal limits. Both lungs are clear. No pneumothorax or pleural effusion is noted. Status post coronary bypass graft. The visualized skeletal structures are unremarkable. IMPRESSION: No active disease. Electronically Signed   By: Marijo Conception M.D.   On: 05/28/2020 09:26    Microbiology: Recent Results (from the past 240 hour(s))  Respiratory Panel by RT PCR (Flu A&B, Covid) - Nasopharyngeal Swab     Status: None   Collection Time: 05/28/20  9:27 AM   Specimen: Nasopharyngeal Swab  Result Value Ref Range Status   SARS Coronavirus 2 by RT PCR NEGATIVE NEGATIVE Final    Comment: (NOTE) SARS-CoV-2 target nucleic acids are NOT DETECTED.  The SARS-CoV-2 RNA is generally detectable in upper respiratoy specimens during the acute phase of infection. The lowest concentration of SARS-CoV-2 viral copies this assay can detect is 131 copies/mL. A negative result does not preclude SARS-Cov-2 infection and should not be used as the sole basis for treatment or other patient management decisions. A negative result may occur with  improper specimen collection/handling, submission of specimen other than nasopharyngeal swab, presence of viral mutation(s) within the areas targeted by this assay, and inadequate number of viral copies (<131 copies/mL). A negative result must be combined with clinical observations, patient history, and epidemiological information. The expected result is Negative.  Fact Sheet for Patients:  PinkCheek.be  Fact Sheet for Healthcare Providers:  GravelBags.it  This test is no t yet approved or cleared by the [place] FDA and  has been authorized for detection and/or diagnosis of SARS-CoV-2 by FDA under an Emergency Use Authorization (EUA).  This EUA will remain  in effect (meaning this test can be used) for the duration of the COVID-19 declaration under Section 564(b)(1) of the Act, 21 U.S.C. section 360bbb-3(b)(1), unless the authorization is terminated or revoked sooner.     Influenza A by PCR NEGATIVE NEGATIVE Final   Influenza B by PCR NEGATIVE NEGATIVE Final    Comment: (NOTE) The Xpert Xpress SARS-CoV-2/FLU/RSV assay is intended as an aid in  the diagnosis of influenza from Nasopharyngeal swab specimens and  should not be used as a sole basis for treatment. Nasal washings and  aspirates are unacceptable for Xpert Xpress SARS-CoV-2/FLU/RSV  testing.  Fact Sheet for Patients: PinkCheek.be  Fact Sheet for Healthcare Providers: GravelBags.it  This test is not yet approved or cleared by the [place] FDA and  has been authorized for detection and/or diagnosis of SARS-CoV-2 by  FDA under an Emergency Use Authorization (EUA). This EUA will remain  in effect (meaning this test can be used) for the duration of the  Covid-19 declaration under Section 564(b)(1) of the Act, 21  U.S.C. section 360bbb-3(b)(1), unless the authorization  is  terminated or revoked. Performed at [facility], [address] [place], [place] [address]      Labs: CBC: Recent Labs  Lab 05/28/20 [phone] 05/29/20 0352 05/30/20 0754  WBC 19.2* 30.2* 27.1*  NEUTROABS  --   --  19.0*  HGB 16.9 14.1 13.0  HCT 51.6 42.2 39.8  MCV 100.4* 100.5* 100.8*  PLT 229 171 947   Basic Metabolic Panel: Recent Labs  Lab 05/28/20 0806 05/29/20 0352 05/30/20 0754  NA 134* 136 133*  K 5.3* 5.1 4.7  CL 98 109 107  CO2 25 23 18*  GLUCOSE 250* 170* 137*  BUN 49* 52* 34*  CREATININE 2.12* 1.91* 1.61*  CALCIUM 11.2* 9.9 9.8   Liver Function Tests: Recent Labs  Lab 05/28/20 0806 05/29/20 0352 05/30/20 0754  AST 55* 29 20  ALT 69* 41 31  ALKPHOS 65 49 51  BILITOT 1.3* 1.0  1.0  PROT 8.3* 6.1* 6.0*  ALBUMIN 4.4 3.1* 3.0*   CBG: Recent Labs  Lab 05/29/20 1152 05/29/20 1622 05/29/20 2118 05/30/20 0733 05/30/20 1131  GLUCAP 166* 143* 127* 122* 198*    Time spent: 35 minutes  Signed:  Berle Mull  [facility] 05/30/2020 4:05 PM

## 2020-08-09 ENCOUNTER — Other Ambulatory Visit: Payer: Self-pay | Admitting: Physician Assistant

## 2020-08-09 DIAGNOSIS — M75101 Unspecified rotator cuff tear or rupture of right shoulder, not specified as traumatic: Secondary | ICD-10-CM

## 2020-08-09 DIAGNOSIS — M12811 Other specific arthropathies, not elsewhere classified, right shoulder: Secondary | ICD-10-CM

## 2020-08-11 ENCOUNTER — Other Ambulatory Visit: Payer: Self-pay | Admitting: Orthopedic Surgery

## 2020-08-11 DIAGNOSIS — M12811 Other specific arthropathies, not elsewhere classified, right shoulder: Secondary | ICD-10-CM

## 2020-08-16 ENCOUNTER — Other Ambulatory Visit: Payer: Self-pay

## 2020-08-16 ENCOUNTER — Ambulatory Visit
Admission: RE | Admit: 2020-08-16 | Discharge: 2020-08-16 | Disposition: A | Discharge status: Home / Self Care | Payer: Medicare HMO | Source: Ambulatory Visit | Attending: Orthopedic Surgery | Admitting: Orthopedic Surgery

## 2020-08-16 DIAGNOSIS — M19011 Primary osteoarthritis, right shoulder: Secondary | ICD-10-CM | POA: Diagnosis not present

## 2020-08-16 DIAGNOSIS — M75121 Complete rotator cuff tear or rupture of right shoulder, not specified as traumatic: Secondary | ICD-10-CM | POA: Diagnosis not present

## 2020-08-16 DIAGNOSIS — M12811 Other specific arthropathies, not elsewhere classified, right shoulder: Secondary | ICD-10-CM

## 2020-08-16 MED ORDER — IOHEXOL 180 MG/ML  SOLN
15.0000 mL | Freq: Once | INTRAMUSCULAR | Status: AC | PRN
  Administered 2020-08-16: 15 mL

## 2020-12-01 ENCOUNTER — Other Ambulatory Visit: Payer: Self-pay

## 2020-12-01 DIAGNOSIS — Z85038 Personal history of other malignant neoplasm of large intestine: Secondary | ICD-10-CM

## 2020-12-01 DIAGNOSIS — E785 Hyperlipidemia, unspecified: Secondary | ICD-10-CM | POA: Diagnosis present

## 2020-12-01 DIAGNOSIS — E1169 Type 2 diabetes mellitus with other specified complication: Secondary | ICD-10-CM | POA: Diagnosis present

## 2020-12-01 DIAGNOSIS — R066 Hiccough: Secondary | ICD-10-CM | POA: Diagnosis present

## 2020-12-01 DIAGNOSIS — Z888 Allergy status to other drugs, medicaments and biological substances status: Secondary | ICD-10-CM

## 2020-12-01 DIAGNOSIS — Z951 Presence of aortocoronary bypass graft: Secondary | ICD-10-CM

## 2020-12-01 DIAGNOSIS — Z7982 Long term (current) use of aspirin: Secondary | ICD-10-CM

## 2020-12-01 DIAGNOSIS — K56609 Unspecified intestinal obstruction, unspecified as to partial versus complete obstruction: Secondary | ICD-10-CM | POA: Diagnosis not present

## 2020-12-01 DIAGNOSIS — K5651 Intestinal adhesions [bands], with partial obstruction: Principal | ICD-10-CM | POA: Diagnosis present

## 2020-12-01 DIAGNOSIS — Z85528 Personal history of other malignant neoplasm of kidney: Secondary | ICD-10-CM

## 2020-12-01 DIAGNOSIS — E1140 Type 2 diabetes mellitus with diabetic neuropathy, unspecified: Secondary | ICD-10-CM | POA: Diagnosis present

## 2020-12-01 DIAGNOSIS — E1165 Type 2 diabetes mellitus with hyperglycemia: Secondary | ICD-10-CM | POA: Diagnosis present

## 2020-12-01 DIAGNOSIS — Z7984 Long term (current) use of oral hypoglycemic drugs: Secondary | ICD-10-CM

## 2020-12-01 DIAGNOSIS — I4510 Unspecified right bundle-branch block: Secondary | ICD-10-CM | POA: Diagnosis present

## 2020-12-01 DIAGNOSIS — Z8507 Personal history of malignant neoplasm of pancreas: Secondary | ICD-10-CM

## 2020-12-01 DIAGNOSIS — S21212A Laceration without foreign body of left back wall of thorax without penetration into thoracic cavity, initial encounter: Secondary | ICD-10-CM | POA: Diagnosis not present

## 2020-12-01 DIAGNOSIS — Z20822 Contact with and (suspected) exposure to covid-19: Secondary | ICD-10-CM | POA: Diagnosis present

## 2020-12-01 DIAGNOSIS — E039 Hypothyroidism, unspecified: Secondary | ICD-10-CM | POA: Diagnosis present

## 2020-12-01 DIAGNOSIS — X58XXXA Exposure to other specified factors, initial encounter: Secondary | ICD-10-CM | POA: Diagnosis not present

## 2020-12-01 DIAGNOSIS — R778 Other specified abnormalities of plasma proteins: Secondary | ICD-10-CM | POA: Diagnosis present

## 2020-12-01 DIAGNOSIS — Z881 Allergy status to other antibiotic agents status: Secondary | ICD-10-CM

## 2020-12-01 DIAGNOSIS — E1122 Type 2 diabetes mellitus with diabetic chronic kidney disease: Secondary | ICD-10-CM | POA: Diagnosis present

## 2020-12-01 DIAGNOSIS — I252 Old myocardial infarction: Secondary | ICD-10-CM

## 2020-12-01 DIAGNOSIS — Z800 Family history of malignant neoplasm of digestive organs: Secondary | ICD-10-CM

## 2020-12-01 DIAGNOSIS — N1832 Chronic kidney disease, stage 3b: Secondary | ICD-10-CM | POA: Diagnosis present

## 2020-12-01 DIAGNOSIS — Z905 Acquired absence of kidney: Secondary | ICD-10-CM

## 2020-12-01 DIAGNOSIS — F17290 Nicotine dependence, other tobacco product, uncomplicated: Secondary | ICD-10-CM | POA: Diagnosis present

## 2020-12-01 DIAGNOSIS — Z79899 Other long term (current) drug therapy: Secondary | ICD-10-CM

## 2020-12-01 DIAGNOSIS — Z955 Presence of coronary angioplasty implant and graft: Secondary | ICD-10-CM

## 2020-12-01 DIAGNOSIS — I2510 Atherosclerotic heart disease of native coronary artery without angina pectoris: Secondary | ICD-10-CM | POA: Diagnosis present

## 2020-12-01 DIAGNOSIS — Z79890 Hormone replacement therapy (postmenopausal): Secondary | ICD-10-CM

## 2020-12-01 DIAGNOSIS — R55 Syncope and collapse: Secondary | ICD-10-CM | POA: Diagnosis present

## 2020-12-01 DIAGNOSIS — Z9081 Acquired absence of spleen: Secondary | ICD-10-CM

## 2020-12-01 DIAGNOSIS — I129 Hypertensive chronic kidney disease with stage 1 through stage 4 chronic kidney disease, or unspecified chronic kidney disease: Secondary | ICD-10-CM | POA: Diagnosis present

## 2020-12-01 DIAGNOSIS — F17220 Nicotine dependence, chewing tobacco, uncomplicated: Secondary | ICD-10-CM | POA: Diagnosis present

## 2020-12-01 DIAGNOSIS — Y92239 Unspecified place in hospital as the place of occurrence of the external cause: Secondary | ICD-10-CM | POA: Diagnosis not present

## 2020-12-01 NOTE — ED Triage Notes (Signed)
Pt presents to ER from home with complaints of epigastric pain since 6pm. Pt reports feels nausea but denies any episodes of emesis. Pt reports he has been burping since 6pm. Pt talk sin complete sentences. Denies any other symptom

## 2020-12-02 ENCOUNTER — Encounter: Payer: Self-pay | Admitting: Emergency Medicine

## 2020-12-02 ENCOUNTER — Inpatient Hospital Stay: Payer: Medicare HMO

## 2020-12-02 ENCOUNTER — Inpatient Hospital Stay
Admission: EM | Admit: 2020-12-02 | Discharge: 2020-12-06 | DRG: 390 | Disposition: A | Discharge status: Home / Self Care | Payer: Medicare HMO | Attending: Internal Medicine | Admitting: Internal Medicine

## 2020-12-02 ENCOUNTER — Emergency Department: Payer: Medicare HMO

## 2020-12-02 DIAGNOSIS — Z0189 Encounter for other specified special examinations: Secondary | ICD-10-CM

## 2020-12-02 DIAGNOSIS — R7989 Other specified abnormal findings of blood chemistry: Secondary | ICD-10-CM | POA: Diagnosis present

## 2020-12-02 DIAGNOSIS — E1169 Type 2 diabetes mellitus with other specified complication: Secondary | ICD-10-CM

## 2020-12-02 DIAGNOSIS — E1122 Type 2 diabetes mellitus with diabetic chronic kidney disease: Secondary | ICD-10-CM | POA: Diagnosis present

## 2020-12-02 DIAGNOSIS — Z7982 Long term (current) use of aspirin: Secondary | ICD-10-CM | POA: Diagnosis not present

## 2020-12-02 DIAGNOSIS — Z85038 Personal history of other malignant neoplasm of large intestine: Secondary | ICD-10-CM | POA: Diagnosis not present

## 2020-12-02 DIAGNOSIS — I129 Hypertensive chronic kidney disease with stage 1 through stage 4 chronic kidney disease, or unspecified chronic kidney disease: Secondary | ICD-10-CM | POA: Diagnosis present

## 2020-12-02 DIAGNOSIS — R55 Syncope and collapse: Secondary | ICD-10-CM | POA: Diagnosis present

## 2020-12-02 DIAGNOSIS — E1142 Type 2 diabetes mellitus with diabetic polyneuropathy: Secondary | ICD-10-CM

## 2020-12-02 DIAGNOSIS — I252 Old myocardial infarction: Secondary | ICD-10-CM | POA: Diagnosis not present

## 2020-12-02 DIAGNOSIS — I2510 Atherosclerotic heart disease of native coronary artery without angina pectoris: Secondary | ICD-10-CM | POA: Diagnosis present

## 2020-12-02 DIAGNOSIS — E039 Hypothyroidism, unspecified: Secondary | ICD-10-CM | POA: Diagnosis present

## 2020-12-02 DIAGNOSIS — Z951 Presence of aortocoronary bypass graft: Secondary | ICD-10-CM | POA: Diagnosis not present

## 2020-12-02 DIAGNOSIS — K56600 Partial intestinal obstruction, unspecified as to cause: Secondary | ICD-10-CM

## 2020-12-02 DIAGNOSIS — Z85528 Personal history of other malignant neoplasm of kidney: Secondary | ICD-10-CM

## 2020-12-02 DIAGNOSIS — K56609 Unspecified intestinal obstruction, unspecified as to partial versus complete obstruction: Secondary | ICD-10-CM

## 2020-12-02 DIAGNOSIS — Y92239 Unspecified place in hospital as the place of occurrence of the external cause: Secondary | ICD-10-CM | POA: Diagnosis not present

## 2020-12-02 DIAGNOSIS — Z7984 Long term (current) use of oral hypoglycemic drugs: Secondary | ICD-10-CM | POA: Diagnosis not present

## 2020-12-02 DIAGNOSIS — Z9081 Acquired absence of spleen: Secondary | ICD-10-CM | POA: Diagnosis not present

## 2020-12-02 DIAGNOSIS — R112 Nausea with vomiting, unspecified: Secondary | ICD-10-CM

## 2020-12-02 DIAGNOSIS — E785 Hyperlipidemia, unspecified: Secondary | ICD-10-CM

## 2020-12-02 DIAGNOSIS — R778 Other specified abnormalities of plasma proteins: Secondary | ICD-10-CM | POA: Diagnosis present

## 2020-12-02 DIAGNOSIS — Z881 Allergy status to other antibiotic agents status: Secondary | ICD-10-CM | POA: Diagnosis not present

## 2020-12-02 DIAGNOSIS — I10 Essential (primary) hypertension: Secondary | ICD-10-CM

## 2020-12-02 DIAGNOSIS — Z79899 Other long term (current) drug therapy: Secondary | ICD-10-CM | POA: Diagnosis not present

## 2020-12-02 DIAGNOSIS — R066 Hiccough: Secondary | ICD-10-CM

## 2020-12-02 DIAGNOSIS — K5651 Intestinal adhesions [bands], with partial obstruction: Secondary | ICD-10-CM | POA: Diagnosis present

## 2020-12-02 DIAGNOSIS — N1832 Chronic kidney disease, stage 3b: Secondary | ICD-10-CM | POA: Diagnosis present

## 2020-12-02 DIAGNOSIS — Z8507 Personal history of malignant neoplasm of pancreas: Secondary | ICD-10-CM | POA: Diagnosis not present

## 2020-12-02 DIAGNOSIS — Z4659 Encounter for fitting and adjustment of other gastrointestinal appliance and device: Secondary | ICD-10-CM

## 2020-12-02 DIAGNOSIS — Z888 Allergy status to other drugs, medicaments and biological substances status: Secondary | ICD-10-CM | POA: Diagnosis not present

## 2020-12-02 DIAGNOSIS — Z955 Presence of coronary angioplasty implant and graft: Secondary | ICD-10-CM | POA: Diagnosis not present

## 2020-12-02 DIAGNOSIS — X58XXXA Exposure to other specified factors, initial encounter: Secondary | ICD-10-CM | POA: Diagnosis not present

## 2020-12-02 DIAGNOSIS — Z20822 Contact with and (suspected) exposure to covid-19: Secondary | ICD-10-CM | POA: Diagnosis present

## 2020-12-02 LAB — TROPONIN I (HIGH SENSITIVITY)
Troponin I (High Sensitivity): 44 ng/L — ABNORMAL HIGH (ref <18)
Troponin I (High Sensitivity): 46 ng/L — ABNORMAL HIGH (ref <18)

## 2020-12-02 LAB — CBC WITH DIFFERENTIAL/PLATELET
Abs Immature Granulocytes: 0.06 10*3/uL (ref 0.00–0.07)
Basophils Absolute: 0.1 10*3/uL (ref 0.0–0.1)
Basophils Relative: 0 %
Eosinophils Absolute: 0.1 10*3/uL (ref 0.0–0.5)
Eosinophils Relative: 0 %
HCT: 48.6 % (ref 39.0–52.0)
Hemoglobin: 15.8 g/dL (ref 13.0–17.0)
Immature Granulocytes: 0 %
Lymphocytes Relative: 32 %
Lymphs Abs: 5.2 10*3/uL — ABNORMAL HIGH (ref 0.7–4.0)
MCH: 33.4 pg (ref 26.0–34.0)
MCHC: 32.5 g/dL (ref 30.0–36.0)
MCV: 102.7 fL — ABNORMAL HIGH (ref 80.0–100.0)
Monocytes Absolute: 1.3 10*3/uL — ABNORMAL HIGH (ref 0.1–1.0)
Monocytes Relative: 8 %
Neutro Abs: 9.7 10*3/uL — ABNORMAL HIGH (ref 1.7–7.7)
Neutrophils Relative %: 60 %
Platelets: 233 10*3/uL (ref 150–400)
RBC: 4.73 MIL/uL (ref 4.22–5.81)
RDW: 12.7 % (ref 11.5–15.5)
Smear Review: NORMAL
WBC: 16.4 10*3/uL — ABNORMAL HIGH (ref 4.0–10.5)
nRBC: 0 % (ref 0.0–0.2)

## 2020-12-02 LAB — COMPREHENSIVE METABOLIC PANEL
ALT: 36 U/L (ref 0–44)
AST: 40 U/L (ref 15–41)
Albumin: 4.1 g/dL (ref 3.5–5.0)
Alkaline Phosphatase: 51 U/L (ref 38–126)
Anion gap: 8 (ref 5–15)
BUN: 34 mg/dL — ABNORMAL HIGH (ref 8–23)
CO2: 26 mmol/L (ref 22–32)
Calcium: 10.5 mg/dL — ABNORMAL HIGH (ref 8.9–10.3)
Chloride: 102 mmol/L (ref 98–111)
Creatinine, Ser: 1.63 mg/dL — ABNORMAL HIGH (ref 0.61–1.24)
GFR, Estimated: 42 mL/min — ABNORMAL LOW (ref >60)
Glucose, Bld: 269 mg/dL — ABNORMAL HIGH (ref 70–99)
Potassium: 4.5 mmol/L (ref 3.5–5.1)
Sodium: 136 mmol/L (ref 135–145)
Total Bilirubin: 0.9 mg/dL (ref 0.3–1.2)
Total Protein: 7.4 g/dL (ref 6.5–8.1)

## 2020-12-02 LAB — CBG MONITORING, ED
Glucose-Capillary: 106 mg/dL — ABNORMAL HIGH (ref 70–99)
Glucose-Capillary: 133 mg/dL — ABNORMAL HIGH (ref 70–99)
Glucose-Capillary: 147 mg/dL — ABNORMAL HIGH (ref 70–99)
Glucose-Capillary: 209 mg/dL — ABNORMAL HIGH (ref 70–99)
Glucose-Capillary: 262 mg/dL — ABNORMAL HIGH (ref 70–99)

## 2020-12-02 LAB — URINALYSIS, COMPLETE (UACMP) WITH MICROSCOPIC
Bacteria, UA: NONE SEEN
Bilirubin Urine: NEGATIVE
Glucose, UA: >=500 mg/dL — AB
Hgb urine dipstick: NEGATIVE
Ketones, ur: NEGATIVE mg/dL
Leukocytes,Ua: NEGATIVE
Nitrite: NEGATIVE
Protein, ur: NEGATIVE mg/dL
Specific Gravity, Urine: 1.035 — ABNORMAL HIGH (ref 1.005–1.030)
Squamous Epithelial / HPF: NONE SEEN (ref 0–5)
pH: 5 (ref 5.0–8.0)

## 2020-12-02 LAB — RESP PANEL BY RT-PCR (FLU A&B, COVID) ARPGX2
Influenza A by PCR: NEGATIVE
Influenza B by PCR: NEGATIVE
SARS Coronavirus 2 by RT PCR: NEGATIVE

## 2020-12-02 LAB — GLUCOSE, CAPILLARY: Glucose-Capillary: 108 mg/dL — ABNORMAL HIGH (ref 70–99)

## 2020-12-02 LAB — LIPASE, BLOOD: Lipase: 20 U/L (ref 11–51)

## 2020-12-02 LAB — HEMOGLOBIN A1C
Hgb A1c MFr Bld: 9.6 % — ABNORMAL HIGH (ref 4.8–5.6)
Mean Plasma Glucose: 228.82 mg/dL

## 2020-12-02 MED ORDER — DIATRIZOATE MEGLUMINE & SODIUM 66-10 % PO SOLN
90.0000 mL | Freq: Once | ORAL | Status: AC
  Administered 2020-12-02: 90 mL via NASOGASTRIC

## 2020-12-02 MED ORDER — LABETALOL HCL 5 MG/ML IV SOLN
10.0000 mg | INTRAVENOUS | Status: DC | PRN
  Administered 2020-12-03 (×2): 10 mg via INTRAVENOUS
  Filled 2020-12-02 (×3): qty 4

## 2020-12-02 MED ORDER — LABETALOL HCL 5 MG/ML IV SOLN
10.0000 mg | INTRAVENOUS | Status: DC | PRN
  Administered 2020-12-02: 10 mg via INTRAVENOUS
  Filled 2020-12-02: qty 4

## 2020-12-02 MED ORDER — FAMOTIDINE IN NACL 20-0.9 MG/50ML-% IV SOLN
20.0000 mg | Freq: Once | INTRAVENOUS | Status: AC
  Administered 2020-12-02: 20 mg via INTRAVENOUS
  Filled 2020-12-02: qty 50

## 2020-12-02 MED ORDER — ACETAMINOPHEN 650 MG RE SUPP: 650.0000 mg | Freq: Four times a day (QID) | RECTAL | Status: DC | PRN

## 2020-12-02 MED ORDER — LACTATED RINGERS IV SOLN: INTRAVENOUS | Status: DC

## 2020-12-02 MED ORDER — INSULIN ASPART 100 UNIT/ML IJ SOLN
0.0000 [IU] | INTRAMUSCULAR | Status: DC
  Administered 2020-12-02: 3 [IU] via SUBCUTANEOUS
  Administered 2020-12-02: 2 [IU] via SUBCUTANEOUS
  Administered 2020-12-03: 1 [IU] via SUBCUTANEOUS
  Administered 2020-12-03: 2 [IU] via SUBCUTANEOUS
  Administered 2020-12-03 – 2020-12-04 (×3): 1 [IU] via SUBCUTANEOUS
  Administered 2020-12-04 – 2020-12-05 (×2): 2 [IU] via SUBCUTANEOUS
  Administered 2020-12-05 – 2020-12-06 (×3): 1 [IU] via SUBCUTANEOUS
  Filled 2020-12-02 (×10): qty 1

## 2020-12-02 MED ORDER — METOPROLOL TARTRATE 5 MG/5ML IV SOLN
2.5000 mg | Freq: Four times a day (QID) | INTRAVENOUS | Status: DC
  Administered 2020-12-02: 2.5 mg via INTRAVENOUS
  Filled 2020-12-02: qty 5

## 2020-12-02 MED ORDER — MORPHINE SULFATE (PF) 4 MG/ML IV SOLN
4.0000 mg | Freq: Once | INTRAVENOUS | Status: AC
Start: 2020-12-02 — End: 2020-12-02
  Administered 2020-12-02: 4 mg via INTRAVENOUS

## 2020-12-02 MED ORDER — LACTATED RINGERS IV BOLUS
1000.0000 mL | Freq: Once | INTRAVENOUS | Status: AC
  Administered 2020-12-02: 1000 mL via INTRAVENOUS

## 2020-12-02 MED ORDER — IOHEXOL 350 MG/ML SOLN
100.0000 mL | Freq: Once | INTRAVENOUS | Status: AC | PRN
  Administered 2020-12-02: 100 mL via INTRAVENOUS

## 2020-12-02 MED ORDER — ONDANSETRON HCL 4 MG/2ML IJ SOLN
4.0000 mg | Freq: Once | INTRAMUSCULAR | Status: AC
  Administered 2020-12-02: 4 mg via INTRAVENOUS
  Filled 2020-12-02: qty 2

## 2020-12-02 MED ORDER — ONDANSETRON HCL 4 MG/2ML IJ SOLN
4.0000 mg | Freq: Four times a day (QID) | INTRAMUSCULAR | Status: DC | PRN
  Administered 2020-12-02 – 2020-12-05 (×8): 4 mg via INTRAVENOUS
  Filled 2020-12-02 (×9): qty 2

## 2020-12-02 MED ORDER — ONDANSETRON HCL 4 MG PO TABS
4.0000 mg | ORAL_TABLET | Freq: Four times a day (QID) | ORAL | Status: DC | PRN
  Administered 2020-12-02: 4 mg via ORAL

## 2020-12-02 MED ORDER — ACETAMINOPHEN 325 MG PO TABS: 650.0000 mg | ORAL_TABLET | Freq: Four times a day (QID) | ORAL | Status: DC | PRN

## 2020-12-02 MED ORDER — FENTANYL CITRATE (PF) 100 MCG/2ML IJ SOLN: 50.0000 ug | Freq: Once | INTRAMUSCULAR | Status: DC

## 2020-12-02 MED ORDER — MORPHINE SULFATE (PF) 4 MG/ML IV SOLN
4.0000 mg | Freq: Once | INTRAVENOUS | Status: DC
  Filled 2020-12-02: qty 1

## 2020-12-02 MED ORDER — MORPHINE SULFATE (PF) 2 MG/ML IV SOLN: 2.0000 mg | INTRAVENOUS | Status: DC | PRN

## 2020-12-02 MED ORDER — ENOXAPARIN SODIUM 40 MG/0.4ML IJ SOSY
40.0000 mg | PREFILLED_SYRINGE | INTRAMUSCULAR | Status: DC
  Administered 2020-12-02 – 2020-12-06 (×5): 40 mg via SUBCUTANEOUS
  Filled 2020-12-02 (×4): qty 0.4

## 2020-12-02 MED ORDER — CLONIDINE HCL 0.2 MG/24HR TD PTWK
0.2000 mg | MEDICATED_PATCH | TRANSDERMAL | Status: DC
  Administered 2020-12-02: 0.2 mg via TRANSDERMAL
  Filled 2020-12-02: qty 1

## 2020-12-02 MED ORDER — CLONIDINE HCL 0.1 MG/24HR TD PTWK
0.1000 mg | MEDICATED_PATCH | TRANSDERMAL | Status: DC
  Administered 2020-12-02: 0.1 mg via TRANSDERMAL
  Filled 2020-12-02: qty 1

## 2020-12-02 MED ORDER — ONDANSETRON 4 MG PO TBDP
4.0000 mg | ORAL_TABLET | Freq: Once | ORAL | Status: DC | PRN
  Filled 2020-12-02: qty 1

## 2020-12-02 NOTE — ED Notes (Signed)
X-ray contacted for dg abdomen.

## 2020-12-02 NOTE — H&P (Signed)
History and Physical    Daniel Bray PZW:[id number] DOB: November 19, 1934 DOA: 12/02/2020  PCP: Elza Rafter, MD   Patient coming from: Home  I have personally briefly reviewed patient's old medical records in [place]  Chief Complaint: Epigastric pain  HPI: Daniel Bray is a 86 y.o. male with medical history significant for HTN, DM, hypothyroidism, CAD status post CABG, CKD lllb, history of renal cell, colon pancreatic cancer status post nephrectomy, subtotal colectomy and multiple other abdominal surgeries, who presents to the emergency room with a complaint of epigastric pain starting around dinner associated with nausea.  He denied vomiting, diaphoresis, constipation or diarrhea or dysuria and denied cough, fever or chills or chest pain.  Epigastric pain is of severe intensity, nonradiating, improved with burping but otherwise constant and sharp.  While waiting to be seen in the emergency room he had a syncopal episode and was readily brought back to triage ED course: On arrival, patient reportedly looked ashen, pale and diaphoretic afebrile, BP 172/91, pulse 78 with O2 sat 95% on room air.  Blood work significant for leukocytosis of 16,000.  Lipase 20, creatinine at baseline at 1.63.  Troponin 44>46.  Urinalysis without pyuria. EKG: Sinus at 85 with incomplete RBBB unchanged from priors Imaging: CTA aorta showed no evidence of dissection but showed partial small bowel obstruction  NG tube placed in the ED.  Hospitalist consulted for admission.  Review of Systems: As per HPI otherwise all other systems on review of systems negative.    Past Medical History:  Diagnosis Date  . Cancer ([place])    kidney  . Cancer ([place])    pancreas  . Cancer ([place])    colon   . Diabetes mellitus without complication ([place])   . Hyperlipidemia   . Hypertension   . Myocardial infarction ([place])   . Streptococcal infection    Strep Bovis  . Thyroid disease     Past Surgical  History:  Procedure Laterality Date  . CARDIAC ELECTROPHYSIOLOGY STUDY AND ABLATION    . CARDIAC SURGERY    . CORONARY ANGIOPLASTY WITH STENT PLACEMENT    . CORONARY ARTERY BYPASS GRAFT     x3  . SPLENECTOMY       reports that he has been smoking cigars. He has been smoking about 1.00 pack per day. His smokeless tobacco use includes chew. He reports that he does not drink alcohol and does not use drugs.  Allergies  Allergen Reactions  . Levofloxacin Hives and Other (See Comments)  . Levaquin [Levofloxacin In D5w]     Weakness, low blood pressure  . Januvia [Sitagliptin] Rash    Family History  Problem Relation Age of Onset  . Pancreatic cancer Mother   . Liver cancer Mother       Prior to Admission medications   Medication Sig Start Date End Date Taking? Authorizing Provider  acetaminophen (TYLENOL) 650 MG CR tablet Take 650 mg by mouth 2 (two) times daily.    [provider]  ALPRAZolam Duanne Moron) 0.25 MG tablet Take 0.125 mg by mouth at bedtime as needed for sleep.     [provider]  aspirin EC 81 MG tablet Take 81 mg by mouth daily.    [provider]  azelastine (ASTELIN) 0.1 % nasal spray Place 2 sprays into both nostrils 2 (two) times daily. 05/17/20   [provider]  cholecalciferol (VITAMIN D) 1000 units tablet Take 2,500 Units by mouth daily.     [provider]  DM-APAP-CPM (CORICIDIN HBP) 10-325-2 MG TABS Take 10 mg by mouth 2 (two) times daily as needed (cold symptoms).     [provider]  Dulaglutide (TRULICITY) 1.5 0000000 SOPN Inject 1.5 mg into the skin every Saturday.     [provider]  empagliflozin (JARDIANCE) 10 MG TABS tablet Take 10 mg by mouth daily.    [provider]  gabapentin (NEURONTIN) 300 MG capsule Take 300 mg by mouth 2 (two) times daily.     [provider]  glipiZIDE (GLUCOTROL) 10 MG tablet Take 10 mg by mouth 2 (two) times daily with a meal.     [provider]  hydrALAZINE (APRESOLINE) 10 MG tablet Take 1 tablet (10 mg total) by mouth 4 (four) times daily as needed (SBP >170). 05/30/20 05/30/21  Lavina Hamman, MD  levothyroxine (SYNTHROID) 75 MCG tablet Take 75 mcg by mouth daily before breakfast.     [provider]  omeprazole (PRILOSEC) 40 MG capsule Take 40 mg by mouth daily.     [provider]  pyridoxine (B-6) 100 MG tablet Take 100 mg by mouth daily.    [provider]  rosuvastatin (CRESTOR) 40 MG tablet Take 40 mg by mouth daily.    [provider]  Simethicone 80 MG TABS Take 1 tablet (80 mg total) by mouth 4 (four) times daily as needed. 05/30/20   Lavina Hamman, MD    Physical Exam: Vitals:   12/01/20 2355 12/01/20 2357 12/02/20 0130 12/02/20 0230  BP: (!) 172/91  (!) 185/99 (!) 179/99  Pulse: 78  91 91  Resp: 20  13 14   Temp: 98.3 F (36.8 C)     TempSrc: Oral     SpO2: 95%  92% 95%  Weight:  65.3 kg    Height:  5\' 10"  (1.778 m)       Vitals:   12/01/20 2355 12/01/20 2357 12/02/20 0130 12/02/20 0230  BP: (!) 172/91  (!) 185/99 (!) 179/99  Pulse: 78  91 91  Resp: 20  13 14   Temp: 98.3 F (36.8 C)     TempSrc: Oral     SpO2: 95%  92% 95%  Weight:  65.3 kg    Height:  5\' 10"  (1.778 m)        Constitutional: Frail-appearing elderly male Alert and oriented x 3 . Not in any apparent distress HEENT:      Head: Normocephalic and atraumatic.         Eyes: PERLA, EOMI, Conjunctivae are normal. Sclera is non-icteric.       Mouth/Throat: Mucous membranes are moist.       Neck: Supple with no signs of meningismus. Cardiovascular: Regular rate and rhythm. No murmurs, gallops, or rubs. 2+ symmetrical distal pulses are present . No JVD. No LE edema Respiratory: Respiratory effort normal .Lungs sounds clear bilaterally. No wheezes, crackles, or rhonchi.  Gastrointestinal: Soft, mild tenderness in epigastrium and supraumbilical region, and non distended with sluggish bowel  sounds.  Genitourinary: No CVA tenderness. Musculoskeletal: Nontender with normal range of motion in all extremities. No cyanosis, or erythema of extremities. Neurologic:  Face is symmetric. Moving all extremities. No gross focal neurologic deficits . Skin: Skin is warm, dry.  No rash or ulcers Psychiatric: Mood and affect are normal    Labs on Admission: I have personally reviewed following labs and imaging studies  CBC: Recent Labs  Lab 12/02/20 0022  WBC 16.4*  NEUTROABS 9.7*  HGB  15.8  HCT 48.6  MCV 102.7*  PLT 016   Basic Metabolic Panel: Recent Labs  Lab 12/02/20 0022  NA 136  K 4.5  CL 102  CO2 26  GLUCOSE 269*  BUN 34*  CREATININE 1.63*  CALCIUM 10.5*   GFR: Estimated Creatinine Clearance: 31.7 mL/min (A) (by C-G formula based on SCr of 1.63 mg/dL (H)). Liver Function Tests: Recent Labs  Lab 12/02/20 0022  AST 40  ALT 36  ALKPHOS 51  BILITOT 0.9  PROT 7.4  ALBUMIN 4.1   Recent Labs  Lab 12/02/20 0022  LIPASE 20   No results for input(s): AMMONIA in the last 168 hours. Coagulation Profile: No results for input(s): INR, PROTIME in the last 168 hours. Cardiac Enzymes: No results for input(s): CKTOTAL, CKMB, CKMBINDEX, TROPONINI in the last 168 hours. BNP (last 3 results) No results for input(s): PROBNP in the last 8760 hours. HbA1C: No results for input(s): HGBA1C in the last 72 hours. CBG: Recent Labs  Lab 12/02/20 0001  GLUCAP 262*   Lipid Profile: No results for input(s): CHOL, HDL, LDLCALC, TRIG, CHOLHDL, LDLDIRECT in the last 72 hours. Thyroid Function Tests: No results for input(s): TSH, T4TOTAL, FREET4, T3FREE, THYROIDAB in the last 72 hours. Anemia Panel: No results for input(s): VITAMINB12, FOLATE, FERRITIN, TIBC, IRON, RETICCTPCT in the last 72 hours. Urine analysis:    Component Value Date/Time   COLORURINE STRAW (A) 12/02/2020 0238   APPEARANCEUR CLEAR (A) 12/02/2020 0238   LABSPEC 1.035 (H) 12/02/2020 0238   PHURINE 5.0  12/02/2020 0238   GLUCOSEU >=500 (A) 12/02/2020 0238   HGBUR NEGATIVE 12/02/2020 0238   BILIRUBINUR NEGATIVE 12/02/2020 0238   KETONESUR NEGATIVE 12/02/2020 0238   PROTEINUR NEGATIVE 12/02/2020 0238   NITRITE NEGATIVE 12/02/2020 0238   LEUKOCYTESUR NEGATIVE 12/02/2020 0238    Radiological Exams on Admission: CT Angio Chest/Abd/Pel for Dissection W and/or Wo Contrast  Result Date: 12/02/2020 CLINICAL DATA:  Epigastric pain. EXAM: CT ANGIOGRAPHY CHEST, ABDOMEN AND PELVIS TECHNIQUE: Non-contrast CT of the chest was initially obtained. Multidetector CT imaging through the chest, abdomen and pelvis was performed using the standard protocol during bolus administration of intravenous contrast. Multiplanar reconstructed images and MIPs were obtained and reviewed to evaluate the vascular anatomy. CONTRAST:  160mL OMNIPAQUE IOHEXOL 350 MG/ML SOLN COMPARISON:  None. FINDINGS: CTA CHEST FINDINGS Cardiovascular: There is mild calcification of the aortic arch without evidence of aneurysmal dilatation or dissection satisfactory opacification of the pulmonary arteries to the segmental level. No evidence of pulmonary embolism. Normal heart size with marked severity coronary artery calcification. Coronary artery vascular clips are noted. No pericardial effusion. Mediastinum/Nodes: No enlarged mediastinal, hilar, or axillary lymph nodes. Thyroid gland, trachea, and esophagus demonstrate no significant findings. Lungs/Pleura: Very mild atelectasis is seen within the inferior aspect of the left upper lobe and posterior aspects of the bilateral lung bases. There is no evidence of a pleural effusion or pneumothorax. Musculoskeletal: Multiple sternal wires are noted. Multilevel degenerative changes seen throughout the thoracic spine. Review of the MIP images confirms the above findings. CTA ABDOMEN AND PELVIS FINDINGS VASCULAR Aorta: Normal caliber aorta without aneurysm, dissection, vasculitis or significant stenosis. Celiac:  Patent without evidence of aneurysm, dissection, vasculitis or significant stenosis. SMA: Narrowing along its origin without evidence of aneurysm, dissection, vasculitis or significant stenosis. Renals: The right renal artery is patent without evidence of aneurysm, dissection, vasculitis, fibromuscular dysplasia or significant stenosis. IMA: Patent without evidence of aneurysm, dissection, vasculitis or significant stenosis. Inflow: Patent without evidence of  aneurysm, dissection, vasculitis or significant stenosis. Veins: No obvious venous abnormality within the limitations of this arterial phase study. Review of the MIP images confirms the above findings. NON-VASCULAR Hepatobiliary: No focal liver abnormality is seen. No gallstones, gallbladder wall thickening, or biliary dilatation. Pancreas: Fatty replacement of the pancreatic parenchyma is seen. A stable 2.4 cm x 2.2 cm enhancing area of soft tissue attenuation is again seen along the expected region of the pancreatic head. Spleen: The spleen is surgically absent. Adrenals/Urinary Tract: The right adrenal gland is unremarkable. The left kidney is surgically absent. Mild cortical scarring is seen involving the right kidney without evidence of renal calculi or hydronephrosis. Bladder is unremarkable. Stomach/Bowel: The stomach is moderately distended. Surgically anastomosed bowel is seen along the medial aspect of the mid and lower right abdomen, consistent with the patient's history of prior subtotal colectomy. Numerous dilated small bowel loops are seen throughout the left abdomen (maximum small bowel diameter of approximately 4.1 cm). A transition zone is seen within the posterior aspect of the mid left abdomen (axial CT images 115 through 122, CT series number 5). Noninflamed diverticula are seen throughout the sigmoid colon. Lymphatic: No abnormal abdominal or pelvic lymph nodes are identified. Reproductive: The prostate gland is moderately enlarged. Other:  No abdominal wall hernia or abnormality. No abdominopelvic ascites. Musculoskeletal: A total right hip replacement is seen with associated streak artifact and subsequently limited evaluation of the adjacent osseous and soft tissue structures. Marked severity degenerative changes are seen at the level of L4-L5 and L5-S1. Review of the MIP images confirms the above findings. IMPRESSION: 1. Findings consistent with a partial small bowel obstruction. 2. Extensive postoperative changes consistent with the patient's history of prior subtotal colectomy. 3. Evidence of prior median sternotomy the/CABG. 4. Prior splenectomy and left nephrectomy. 5. Sigmoid diverticulosis. Electronically Signed   By: Virgina Norfolk M.D.   On: 12/02/2020 03:34     Assessment/Plan 86 year old male with history of HTN, DM, hypothyroidism, CAD status post CABG, CKD lllb, history of renal cell, colon and pancreatic cancer status post nephrectomy, subtotal colectomy and multiple other abdominal surgeries, presenting with epigastric pain and with syncopal episode while in the emergency room waiting room.     Partial small bowel obstruction (HCC) - Continue NG tube to low intermittent suction - IV hydration - Pain control and IV antiemetics - Surgical consult to follow    Syncope - Suspect vasovagal secondary to pain - Ruled out for dissection with CTA aorta.  ACS unlikely given flat troponins - Cardiac monitoring overnight    Elevated troponin   Hx of CABG - Troponin mildly elevated but with flat trend 44>46. - ACS not suspected at this time - IV metoprolol every 6    Stage 3b chronic kidney disease (HCC) - Creatinine 1.63 which is at baseline    DM (diabetes mellitus) (HCC) - Sliding scale insulin coverage    Essential hypertension - IV metoprolol    History of renal cell carcinoma s/p nephrectomy - No acute procedures    Hypothyroidism - Resume levothyroxine when oral feeds resumed.  Check TSH    DVT  prophylaxis: Lovenox  Code Status: full code  Family Communication:  none  Disposition Plan: Back to previous home environment Consults called: Surgery Status:At the time of admission, it appears that the appropriate admission status for this patient is INPATIENT. This is judged to be reasonable and necessary in order to provide the required intensity of service to ensure the patient's safety given the  presenting symptoms, physical exam findings, and initial radiographic and laboratory data in the context of their  Comorbid conditions.   Patient requires inpatient status due to high intensity of service, high risk for further deterioration and high frequency of surveillance required.   I certify that at the point of admission it is my clinical judgment that the patient will require inpatient hospital care spanning beyond [place] MD [facility]     12/02/2020, 4:04 AM

## 2020-12-02 NOTE — ED Notes (Signed)
Pharm messaged that clonidine patch needed STAT. Will remove previous patch and place new once as soon as received from pharm.

## 2020-12-02 NOTE — Progress Notes (Signed)
Pt arrived from ED via bed. Alert and oriented x 4. NO complaints of pain. VSS. Oriented to room and equipment.

## 2020-12-02 NOTE — ED Notes (Signed)
Ng tube advanced 6cm based on x-ray findings and results.

## 2020-12-02 NOTE — ED Notes (Signed)
Emma NT verbal agreement to take pt to floor soon. Pt will go up in his bed.

## 2020-12-02 NOTE — ED Notes (Signed)
Pt passed out, diaphoretic, repeated ekg critical; MI charge nurse informed

## 2020-12-02 NOTE — ED Notes (Signed)
Pt declined offer for pain med. Agrees to tell this RN know if he changes his mind. Family member remains at bedside.

## 2020-12-02 NOTE — ED Notes (Signed)
Provider [place] notified via secure chat that pt given labetalol 10 an hour ago. BP remains 178/102. MAP 123. HR 94.

## 2020-12-02 NOTE — ED Notes (Signed)
BG 147

## 2020-12-02 NOTE — ED Notes (Signed)
Pt given a cup of ice chips and a spoon. Family member remains at bedside.

## 2020-12-02 NOTE — ED Notes (Signed)
Pt switched from ED stretcher to [facility]patient [facility] bed; pt stood, turned, and sat on bed. Steady. Pt's NG tube remains attached to low intermittent suction. Visitor, pt's daughter, remains at bedside.

## 2020-12-02 NOTE — ED Notes (Signed)
Patient updated on POC. Patient to be moved to CPod pending transport to inpatient bed assignment.

## 2020-12-02 NOTE — ED Notes (Signed)
Ng tubed clamped at this time for administration of gastrografin per Ng tube.

## 2020-12-02 NOTE — ED Provider Notes (Signed)
[facility] Emergency Department Provider Note  ____________________________________________  Time seen: Approximately 3:36 AM  I have reviewed the triage vital signs and the nursing notes.   HISTORY  Chief Complaint Abdominal Pain   HPI Daniel Bray is a 86 y.o. male history of several prior abdominal surgeries, SBO, kidney/pancreas/colon cancer, diabetes, hypertension, hyperlipidemia, CAD status post CABG who presents for evaluation of abdominal pain.   Patient reports epigastric sharp intermittent abdominal pain since 6 PM.  Has had nausea but no vomiting.  Reports that he has been belching a lot.  Has been having normal bowel movements and passing flatus.  Denies abdominal distention.  Denies chest pain or shortness of breath.  The pain radiates to his back.  While waiting in the waiting room patient had a syncopal episode.  Patient arrived in the room ashen color, pale, diaphoretic.  Past Medical History:  Diagnosis Date  . Cancer ([place])    kidney  . Cancer ([place])    pancreas  . Cancer ([place])    colon   . Diabetes mellitus without complication ([place])   . Hyperlipidemia   . Hypertension   . Myocardial infarction ([place])   . Streptococcal infection    Strep Bov[place]  . Thyroid disease     Patient Active Problem List   Diagnosis Date Noted  . Ileus ([place]) 05/29/2020  . Acute renal failure superimposed on stage 3a chronic kidney disease ([place]) 05/28/2020  . Nausea vomiting and diarrhea 05/28/2020  . Abdominal pain 05/28/2020  . Hyperkalemia 05/28/2020  . Hypercalcemia 05/28/2020  . Abnormal LFTs 05/28/2020  . Elevated troponin 01/24/2020  . Leukocytosis 01/24/2020  . HLD (hyperlipidemia) 01/24/2020  . GERD (gastroesophageal reflux disease) 01/24/2020  . Anxiety 01/24/2020  . CAD (coronary artery disease) 01/24/2020  . Tobacco abuse 01/24/2020  . CKD (chronic kidney disease), stage IIIa 01/24/2020  . Bradycardia 01/24/2020  . Laceration of  forehead   . Syncope 10/12/2019  . Type 2 diabetes mellitus with stage 3 chronic kidney disease ([place]) 10/12/2019  . Essential hypertension 10/12/2019  . History of renal cell carcinoma 10/12/2019  . Partial small bowel obstruction ([place]) 10/12/2019  . Hypothyroidism 10/12/2019    Past Surgical History:  Procedure Laterality Date  . CARDIAC ELECTROPHYSIOLOGY STUDY AND ABLATION    . CARDIAC SURGERY    . CORONARY ANGIOPLASTY WITH STENT PLACEMENT    . CORONARY ARTERY BYPASS GRAFT     x3  . SPLENECTOMY      Prior to Admission medications   Medication Sig Start Date End Date Taking? Authorizing Provider  acetaminophen (TYLENOL) 650 MG CR tablet Take 650 mg by mouth 2 (two) times daily.    [provider]  ALPRAZolam Duanne Moron) 0.25 MG tablet Take 0.125 mg by mouth at bedtime as needed for sleep.     [provider]  aspirin EC 81 MG tablet Take 81 mg by mouth daily.    [provider]  azelastine (ASTELIN) 0.1 % nasal spray Place 2 sprays into both nostrils 2 (two) times daily. 05/17/20   [provider]  cholecalciferol (VITAMIN D) 1000 units tablet Take 2,500 Units by mouth daily.     [provider]  DM-APAP-CPM (CORICIDIN HBP) 10-325-2 MG TABS Take 10 mg by mouth 2 (two) times daily as needed (cold symptoms).     [provider]  Dulaglutide (TRULICITY) 1.5 0000000 SOPN Inject 1.5 mg into the skin every Saturday.     [provider]  empagliflozin (JARDIANCE)  10 MG TABS tablet Take 10 mg by mouth daily.    [provider]  gabapentin (NEURONTIN) 300 MG capsule Take 300 mg by mouth 2 (two) times daily.     [provider]  glipiZIDE (GLUCOTROL) 10 MG tablet Take 10 mg by mouth 2 (two) times daily with a meal.     [provider]  hydrALAZINE (APRESOLINE) 10 MG tablet Take 1 tablet (10 mg total) by mouth 4 (four) times daily as needed (SBP >170). 05/30/20 05/30/21  Lavina Hamman, MD  levothyroxine  (SYNTHROID) 75 MCG tablet Take 75 mcg by mouth daily before breakfast.     [provider]  omeprazole (PRILOSEC) 40 MG capsule Take 40 mg by mouth daily.     [provider]  pyridoxine (B-6) 100 MG tablet Take 100 mg by mouth daily.    [provider]  rosuvastatin (CRESTOR) 40 MG tablet Take 40 mg by mouth daily.    [provider]  Simethicone 80 MG TABS Take 1 tablet (80 mg total) by mouth 4 (four) times daily as needed. 05/30/20   Lavina Hamman, MD    Allergies Levofloxacin, Levaquin [levofloxacin in d5w], and Januvia [sitagliptin]  Family History  Problem Relation Age of Onset  . Pancreatic cancer Mother   . Liver cancer Mother     Social History Social History   Tobacco Use  . Smoking status: Current Every Day Smoker    Packs/day: 1.00    Types: Cigars  . Smokeless tobacco: Current User    Types: Chew  Vaping Use  . Vaping Use: Never used  Substance Use Topics  . Alcohol use: No  . Drug use: No    Review of Systems  Constitutional: Negative for fever. + syncope Eyes: Negative for visual changes. ENT: Negative for sore throat. Neck: No neck pain  Cardiovascular: Negative for chest pain. Respiratory: Negative for shortness of breath. Gastrointestinal: + epigastric abdominal pain and nausea. No vomiting or diarrhea. Genitourinary: Negative for dysuria. Musculoskeletal: Negative for back pain. Skin: Negative for rash. Neurological: Negative for headaches, weakness or numbness. Psych: No SI or HI  ____________________________________________   PHYSICAL EXAM:  VITAL SIGNS: ED Triage Vitals  Enc Vitals Group     BP 12/01/20 2355 (!) 172/91     Pulse Rate 12/01/20 2355 78     Resp 12/01/20 2355 20     Temp 12/01/20 2355 98.3 F (36.8 C)     Temp Source 12/01/20 2355 Oral     SpO2 12/01/20 2355 95 %     Weight 12/01/20 2357 144 lb (65.3 kg)     Height 12/01/20 2357 5\' 10"  (1.778 m)     Head Circumference --       Peak Flow --      Pain Score 12/01/20 2357 9     Pain Loc --      Pain Edu? --      Excl. in [place]? --     Constitutional: Alert and oriented, ashen, pale, diaphoretic.  HEENT:      Head: Normocephalic and atraumatic.         Eyes: Conjunctivae are normal. Sclera is non-icteric.       Mouth/Throat: Mucous membranes are moist.       Neck: Supple with no signs of meningismus. Cardiovascular: Regular rate and rhythm. No murmurs, gallops, or rubs. 2+ symmetrical distal pulses are present in all extremities. No JVD. Respiratory: Normal respiratory effort. Lungs are clear to auscultation  bilaterally.  Gastrointestinal: Soft, tender to palpation in the epigastric region, and non distended with positive bowel sounds. No rebound or guarding. Genitourinary: No CVA tenderness. Musculoskeletal:  No edema, cyanosis, or erythema of extremities. Neurologic: Normal speech and language. Face is symmetric. Moving all extremities. No gross focal neurologic deficits are appreciated. Skin: Skin is warm, dry and intact. No rash noted. Psychiatric: Mood and affect are normal. Speech and behavior are normal.  ____________________________________________   LABS (all labs ordered are listed, but only abnormal results are displayed)  Labs Reviewed  COMPREHENSIVE METABOLIC PANEL - Abnormal; Notable for the following components:      Result Value   Glucose, Bld 269 (*)    BUN 34 (*)    Creatinine, Ser 1.63 (*)    Calcium 10.5 (*)    GFR, Estimated 42 (*)    All other components within normal limits  URINALYSIS, COMPLETE (UACMP) WITH MICROSCOPIC - Abnormal; Notable for the following components:   Color, Urine STRAW (*)    APPearance CLEAR (*)    Specific Gravity, Urine 1.035 (*)    Glucose, UA >=500 (*)    All other components within normal limits  CBC WITH DIFFERENTIAL/PLATELET - Abnormal; Notable for the following components:   WBC 16.4 (*)    MCV 102.7 (*)    Neutro Abs 9.7 (*)    Lymphs Abs 5.2 (*)     Monocytes Absolute 1.3 (*)    All other components within normal limits  CBG MONITORING, ED - Abnormal; Notable for the following components:   Glucose-Capillary 262 (*)    All other components within normal limits  TROPONIN I (HIGH SENSITIVITY) - Abnormal; Notable for the following components:   Troponin I (High Sensitivity) 44 (*)    All other components within normal limits  TROPONIN I (HIGH SENSITIVITY) - Abnormal; Notable for the following components:   Troponin I (High Sensitivity) 46 (*)    All other components within normal limits  RESP PANEL BY RT-PCR (FLU A&B, COVID) ARPGX2  LIPASE, BLOOD   ____________________________________________  EKG  ED ECG REPORT I, Rudene Re, the attending physician, personally viewed and interpreted this ECG.  Sinus rhythm with rate of 85, first-degree AV block, normal QTC, ST elevations anteriorly with depressions in the inferior leads.  This EKG is unchanged when compared to prior. ____________________________________________  RADIOLOGY  I have personally reviewed the images performed during this visit and I agree with the Radiologist's read.   Interpretation by Radiologist:  CT Angio Chest/Abd/Pel for Dissection W and/or Wo Contrast  Result Date: 12/02/2020 CLINICAL DATA:  Epigastric pain. EXAM: CT ANGIOGRAPHY CHEST, ABDOMEN AND PELVIS TECHNIQUE: Non-contrast CT of the chest was initially obtained. Multidetector CT imaging through the chest, abdomen and pelvis was performed using the standard protocol during bolus administration of intravenous contrast. Multiplanar reconstructed images and MIPs were obtained and reviewed to evaluate the vascular anatomy. CONTRAST:  126mL OMNIPAQUE IOHEXOL 350 MG/ML SOLN COMPARISON:  None. FINDINGS: CTA CHEST FINDINGS Cardiovascular: There is mild calcification of the aortic arch without evidence of aneurysmal dilatation or dissection satisfactory opacification of the pulmonary arteries to the  segmental level. No evidence of pulmonary embolism. Normal heart size with marked severity coronary artery calcification. Coronary artery vascular clips are noted. No pericardial effusion. Mediastinum/Nodes: No enlarged mediastinal, hilar, or axillary lymph nodes. Thyroid gland, trachea, and esophagus demonstrate no significant findings. Lungs/Pleura: Very mild atelectasis is seen within the inferior aspect of the left upper lobe and posterior aspects of the bilateral  lung bases. There is no evidence of a pleural effusion or pneumothorax. Musculoskeletal: Multiple sternal wires are noted. Multilevel degenerative changes seen throughout the thoracic spine. Review of the MIP images confirms the above findings. CTA ABDOMEN AND PELVIS FINDINGS VASCULAR Aorta: Normal caliber aorta without aneurysm, dissection, vasculitis or significant stenosis. Celiac: Patent without evidence of aneurysm, dissection, vasculitis or significant stenosis. SMA: Narrowing along its origin without evidence of aneurysm, dissection, vasculitis or significant stenosis. Renals: The right renal artery is patent without evidence of aneurysm, dissection, vasculitis, fibromuscular dysplasia or significant stenosis. IMA: Patent without evidence of aneurysm, dissection, vasculitis or significant stenosis. Inflow: Patent without evidence of aneurysm, dissection, vasculitis or significant stenosis. Veins: No obvious venous abnormality within the limitations of this arterial phase study. Review of the MIP images confirms the above findings. NON-VASCULAR Hepatobiliary: No focal liver abnormality is seen. No gallstones, gallbladder wall thickening, or biliary dilatation. Pancreas: Fatty replacement of the pancreatic parenchyma is seen. A stable 2.4 cm x 2.2 cm enhancing area of soft tissue attenuation is again seen along the expected region of the pancreatic head. Spleen: The spleen is surgically absent. Adrenals/Urinary Tract: The right adrenal gland is  unremarkable. The left kidney is surgically absent. Mild cortical scarring is seen involving the right kidney without evidence of renal calculi or hydronephrosis. Bladder is unremarkable. Stomach/Bowel: The stomach is moderately distended. Surgically anastomosed bowel is seen along the medial aspect of the mid and lower right abdomen, consistent with the patient's history of prior subtotal colectomy. Numerous dilated small bowel loops are seen throughout the left abdomen (maximum small bowel diameter of approximately 4.1 cm). A transition zone is seen within the posterior aspect of the mid left abdomen (axial CT images 115 through 122, CT series number 5). Noninflamed diverticula are seen throughout the sigmoid colon. Lymphatic: No abnormal abdominal or pelvic lymph nodes are identified. Reproductive: The prostate gland is moderately enlarged. Other: No abdominal wall hernia or abnormality. No abdominopelvic ascites. Musculoskeletal: A total right hip replacement is seen with associated streak artifact and subsequently limited evaluation of the adjacent osseous and soft tissue structures. Marked severity degenerative changes are seen at the level of L4-L5 and L5-S1. Review of the MIP images confirms the above findings. IMPRESSION: 1. Findings consistent with a partial small bowel obstruction. 2. Extensive postoperative changes consistent with the patient's history of prior subtotal colectomy. 3. Evidence of prior median sternotomy the/CABG. 4. Prior splenectomy and left nephrectomy. 5. Sigmoid diverticulosis. Electronically Signed   By: Virgina Norfolk M.D.   On: 12/02/2020 03:34     ____________________________________________   PROCEDURES  Procedure(s) performed:yes .1-3 Lead EKG Interpretation Performed by: Rudene Re, MD Authorized by: Rudene Re, MD     Interpretation: abnormal     ECG rate assessment: normal     Rhythm: sinus rhythm     Ectopy: none     Conduction: normal      Critical Care performed:  None ____________________________________________   INITIAL IMPRESSION / ASSESSMENT AND PLAN / ED COURSE   86 y.o. male history of several prior abdominal surgeries, SBO, kidney/pancreas/colon cancer, diabetes, hypertension, hyperlipidemia, CAD status post CABG who presents for evaluation of abdominal pain.   Patient with epigastric abdominal pain radiating to his back since 6 PM.  Patient had a syncopal episode while in the waiting room, arrives to the room complaining of severe pain radiating to his back, ashen and pale in color, diaphoretic.  Triage nurse was concerning for a STEMI however patient had 3 EKGs  every 10 minutes all showing anterior ST elevations with minimal inferior depressions which is identical to patient's prior EKG.  Patient was taken straight to CT to rule out a dissection which was negative.  The CT is positive for SBO.  Patient has had 2 troponins which are within his baseline.  Has hyper glycemia and hypercalcemia with no changes in his creatinine or signs of DKA.  White count of 16.4 with a left shift.  We will place an NG tube, IV morphine, IV fluids, IV Zofran for symptom relief and admit to the hospitalist service.  History gathered from patient and his wife was at bedside.  Plan discussed with both of them.  Patient placed on telemetry for monitoring of cardiorespiratory status.      _____________________________________________ Please note:  Patient was evaluated in Emergency Department today for the symptoms described in the history of present illness. Patient was evaluated in the context of the global COVID-19 pandemic, which necessitated consideration that the patient might be at risk for infection with the SARS-CoV-2 virus that causes COVID-19. Institutional protocols and algorithms that pertain to the evaluation of patients at risk for COVID-19 are in a state of rapid change based on information released by regulatory bodies including  the CDC and federal and state organizations. These policies and algorithms were followed during the patient's care in the ED.  Some ED evaluations and interventions may be delayed as a result of limited staffing during the pandemic.   [place] Controlled Substance Database was reviewed by me. ____________________________________________   FINAL CLINICAL IMPRESSION(S) / ED DIAGNOSES   Final diagnoses:  SBO (small bowel obstruction) ([place])      NEW MEDICATIONS STARTED DURING THIS VISIT:  ED Discharge Orders    None       Note:  This document was prepared using Dragon voice recognition software and may include unintentional dictation errors.    Rudene Re, MD 12/02/20 [phone]

## 2020-12-02 NOTE — ED Notes (Signed)
Provider [place] notified pt requesting ice chips. Pt remains NPO per order at the moment. Reviewed Surgery and [place]'s most recent notes. Awaiting reply from attending.

## 2020-12-02 NOTE — Consult Note (Signed)
SURGICAL CONSULTATION NOTE   HISTORY OF PRESENT ILLNESS (HPI):  86 y.o. male presented to [facility] ED for evaluation of abdominal pain since yesterday. Patient reports he started with midline abdominal pain yesterday night.  He reports that the pain is localized to the mid abdomen.  There is no pain radiation.  Patient had history of bowel obstruction couple of years ago.  He reports that the pain on the last episode was on the left side of the abdomen.  He reported that this pain is different from the previous episode.  He denies any fevers.  There has been some nausea or vomiting.  Cannot identify any aggravating or alleviating factors.  At the ED he was found with elevated white blood cell.  No significant elevation of creatinine.  CT scan of the abdomen and pelvis shows small bowel dilation without free air or free fluid.  I personally evaluated the images.  This was consistent with small bowel obstruction.  Surgery is consulted by Dr. Damita Dunnings in this context for evaluation and management of small bowel obstruction.  PAST MEDICAL HISTORY (PMH):  Past Medical History:  Diagnosis Date  . Cancer ([place])    kidney  . Cancer ([place])    pancreas  . Cancer ([place])    colon   . Diabetes mellitus without complication ([place])   . Hyperlipidemia   . Hypertension   . Myocardial infarction ([place])   . Streptococcal infection    Strep Bovis  . Thyroid disease      PAST SURGICAL HISTORY ([place]):  Past Surgical History:  Procedure Laterality Date  . CARDIAC ELECTROPHYSIOLOGY STUDY AND ABLATION    . CARDIAC SURGERY    . CORONARY ANGIOPLASTY WITH STENT PLACEMENT    . CORONARY ARTERY BYPASS GRAFT     x3  . SPLENECTOMY       MEDICATIONS:  Prior to Admission medications   Medication Sig Start Date End Date Taking? Authorizing Provider  acetaminophen (TYLENOL) 650 MG CR tablet Take 650 mg by mouth 2 (two) times daily.   Yes [provider]  ALPRAZolam (XANAX) 0.25 MG tablet Take 0.125 mg by mouth  at bedtime as needed for sleep.    Yes [provider]  aspirin EC 81 MG tablet Take 81 mg by mouth daily.   Yes [provider]  carvedilol (COREG) 6.25 MG tablet Take 6.25 mg by mouth 2 (two) times daily with a meal.   Yes [provider]  cholecalciferol (VITAMIN D) 1000 units tablet Take 2,000 Units by mouth daily.   Yes [provider]  DM-APAP-CPM (CORICIDIN HBP) 10-325-2 MG TABS Take 10 mg by mouth 2 (two) times daily as needed (cold symptoms).    Yes [provider]  Dulaglutide 3 MG/0.5ML SOPN Inject 3 mg into the skin once a week.   Yes [provider]  empagliflozin (JARDIANCE) 25 MG TABS tablet Take 25 mg by mouth daily.   Yes [provider]  gabapentin (NEURONTIN) 300 MG capsule Take 300 mg by mouth 2 (two) times daily.   Yes [provider]  glipiZIDE (GLUCOTROL) 10 MG tablet Take 10 mg by mouth 2 (two) times daily with a meal.    Yes [provider]  hydrALAZINE (APRESOLINE) 25 MG tablet Take 25 mg by mouth 2 (two) times daily.   Yes [provider]  levocetirizine (XYZAL) 5 MG tablet Take 5 mg by mouth daily. 11/27/20  Yes [provider]  levothyroxine (SYNTHROID) 75 MCG tablet Take 75  mcg by mouth daily before breakfast.    Yes [provider]  lisinopril (ZESTRIL) 20 MG tablet Take 20 mg by mouth daily.   Yes [provider]  omeprazole (PRILOSEC) 40 MG capsule Take 40 mg by mouth daily.    Yes [provider]  pyridoxine (B-6) 100 MG tablet Take 100 mg by mouth daily.   Yes [provider]  rosuvastatin (CRESTOR) 40 MG tablet Take 40 mg by mouth daily.   Yes [provider]  Simethicone 80 MG TABS Take 1 tablet (80 mg total) by mouth 4 (four) times daily as needed. Patient taking differently: Take 1 tablet by mouth 4 (four) times daily as needed (gas). 05/30/20  Yes Lavina Hamman, MD  hydrALAZINE (APRESOLINE) 10 MG tablet Take 1 tablet  (10 mg total) by mouth 4 (four) times daily as needed (SBP >170). Patient not taking: No sig reported 05/30/20 05/30/21  Lavina Hamman, MD  Pancrelipase, Lip-Prot-Amyl, (ZENPEP) 25000-79000 units CPEP Take 2 capsules by mouth See admin instructions. Take 2 capsules by mouth three times daily with meals and take 1 capsule twice daily with a snack    [provider]     ALLERGIES:  Allergies  Allergen Reactions  . Levofloxacin Hives and Other (See Comments)  . Levaquin [Levofloxacin In D5w]     Weakness, low blood pressure  . Januvia [Sitagliptin] Rash     SOCIAL HISTORY:  Social History   Socioeconomic History  . Marital status: Married    Spouse name: Not on file  . Number of children: Not on file  . Years of education: Not on file  . Highest education level: Not on file  Occupational History  . Not on file  Tobacco Use  . Smoking status: Current Every Day Smoker    Packs/day: 1.00    Types: Cigars  . Smokeless tobacco: Current User    Types: Chew  Vaping Use  . Vaping Use: Never used  Substance and Sexual Activity  . Alcohol use: No  . Drug use: No  . Sexual activity: Not on file  Other Topics Concern  . Not on file  Social History Narrative  . Not on file   Social Determinants of Health   Financial Resource Strain: Not on file  Food Insecurity: Not on file  Transportation Needs: Not on file  Physical Activity: Not on file  Stress: Not on file  Social Connections: Not on file  Intimate Partner Violence: Not on file      FAMILY HISTORY:  Family History  Problem Relation Age of Onset  . Pancreatic cancer Mother   . Liver cancer Mother      REVIEW OF SYSTEMS:  Constitutional: denies weight loss, fever, chills, or sweats  Eyes: denies any other vision changes, history of eye injury  ENT: denies sore throat, hearing problems  Respiratory: denies shortness of breath, wheezing  Cardiovascular: denies chest pain, palpitations  Gastrointestinal:  Positive abdominal pain, nausea and vomiting Genitourinary: denies burning with urination or urinary frequency Musculoskeletal: denies any other joint pains or cramps  Skin: denies any other rashes or skin discolorations  Neurological: denies any other headache, dizziness, weakness  Psychiatric: denies any other depression, anxiety   All other review of systems were negative   VITAL SIGNS:  Temp:  [98.3 F (36.8 C)] 98.3 F (36.8 C) (04/29 2355) Pulse Rate:  [78-97] 96 (04/30 0945) Resp:  [10-27] 10 (04/30 0945) BP: (149-185)/(71-101) 155/90 (04/30 1004) SpO2:  [92 %-  96 %] 93 % (04/30 0945) Weight:  [65.3 kg] 65.3 kg (04/29 2357)     Height: 5\' 10"  (177.8 cm) Weight: 65.3 kg BMI (Calculated): 20.66   INTAKE/OUTPUT:  This shift: Total I/O In: 1000 [IV Piggyback:1000] Out: 900 [Emesis/NG output:900]  Last 2 shifts: @IOLAST2SHIFTS @   PHYSICAL EXAM:  Constitutional:  -- Normal body habitus  -- Awake, alert, and oriented x3  Eyes:  -- Pupils equally round and reactive to light  -- No scleral icterus  Ear, nose, and throat:  -- No jugular venous distension  Pulmonary:  -- No crackles  -- Equal breath sounds bilaterally -- Breathing non-labored at rest Cardiovascular:  -- S1, S2 present  -- No pericardial rubs Gastrointestinal:  -- Abdomen soft, nontender, non-distended, no guarding or rebound tenderness -- No abdominal masses appreciated, pulsatile or otherwise  Musculoskeletal and Integumentary:  -- Wounds: None appreciated -- Extremities: B/L UE and LE FROM, hands and feet warm, no edema  Neurologic:  -- Motor function: intact and symmetric -- Sensation: intact and symmetric   Labs:  CBC Latest Ref Rng & Units 12/02/2020 05/30/2020 05/29/2020  WBC 4.0 - 10.5 K/uL 16.4(H) 27.1(H) 30.2(H)  Hemoglobin 13.0 - 17.0 g/dL 15.8 13.0 14.1  Hematocrit 39.0 - 52.0 % 48.6 39.8 42.2  Platelets 150 - 400 K/uL 233 159 171   CMP Latest Ref Rng & Units 12/02/2020 05/30/2020  05/29/2020  Glucose 70 - 99 mg/dL 269(H) 137(H) 170(H)  BUN 8 - 23 mg/dL 34(H) 34(H) 52(H)  Creatinine 0.61 - 1.24 mg/dL 1.63(H) 1.61(H) 1.91(H)  Sodium 135 - 145 mmol/L 136 133(L) 136  Potassium 3.5 - 5.1 mmol/L 4.5 4.7 5.1  Chloride 98 - 111 mmol/L 102 107 109  CO2 22 - 32 mmol/L 26 18(L) 23  Calcium 8.9 - 10.3 mg/dL 10.5(H) 9.8 9.9  Total Protein 6.5 - 8.1 g/dL 7.4 6.0(L) 6.1(L)  Total Bilirubin 0.3 - 1.2 mg/dL 0.9 1.0 1.0  Alkaline Phos 38 - 126 U/L 51 51 49  AST 15 - 41 U/L 40 20 29  ALT 0 - 44 U/L 36 31 41    Imaging studies:  EXAM: CT ANGIOGRAPHY CHEST, ABDOMEN AND PELVIS  TECHNIQUE: Non-contrast CT of the chest was initially obtained.  Multidetector CT imaging through the chest, abdomen and pelvis was performed using the standard protocol during bolus administration of intravenous contrast. Multiplanar reconstructed images and MIPs were obtained and reviewed to evaluate the vascular anatomy.  CONTRAST:  147mL OMNIPAQUE IOHEXOL 350 MG/ML SOLN  COMPARISON:  None.  FINDINGS: CTA CHEST FINDINGS  Cardiovascular: There is mild calcification of the aortic arch without evidence of aneurysmal dilatation or dissection satisfactory opacification of the pulmonary arteries to the segmental level. No evidence of pulmonary embolism. Normal heart size with marked severity coronary artery calcification. Coronary artery vascular clips are noted. No pericardial effusion.  Mediastinum/Nodes: No enlarged mediastinal, hilar, or axillary lymph nodes. Thyroid gland, trachea, and esophagus demonstrate no significant findings.  Lungs/Pleura: Very mild atelectasis is seen within the inferior aspect of the left upper lobe and posterior aspects of the bilateral lung bases.  There is no evidence of a pleural effusion or pneumothorax.  Musculoskeletal: Multiple sternal wires are noted. Multilevel degenerative changes seen throughout the thoracic spine.  Review of the MIP  images confirms the above findings.  CTA ABDOMEN AND PELVIS FINDINGS  VASCULAR  Aorta: Normal caliber aorta without aneurysm, dissection, vasculitis or significant stenosis.  Celiac: Patent without evidence of aneurysm, dissection, vasculitis or significant stenosis.  SMA: Narrowing along its origin without evidence of aneurysm, dissection, vasculitis or significant stenosis.  Renals: The right renal artery is patent without evidence of aneurysm, dissection, vasculitis, fibromuscular dysplasia or significant stenosis.  IMA: Patent without evidence of aneurysm, dissection, vasculitis or significant stenosis.  Inflow: Patent without evidence of aneurysm, dissection, vasculitis or significant stenosis.  Veins: No obvious venous abnormality within the limitations of this arterial phase study.  Review of the MIP images confirms the above findings.  NON-VASCULAR  Hepatobiliary: No focal liver abnormality is seen. No gallstones, gallbladder wall thickening, or biliary dilatation.  Pancreas: Fatty replacement of the pancreatic parenchyma is seen. A stable 2.4 cm x 2.2 cm enhancing area of soft tissue attenuation is again seen along the expected region of the pancreatic head.  Spleen: The spleen is surgically absent.  Adrenals/Urinary Tract: The right adrenal gland is unremarkable. The left kidney is surgically absent. Mild cortical scarring is seen involving the right kidney without evidence of renal calculi or hydronephrosis. Bladder is unremarkable.  Stomach/Bowel: The stomach is moderately distended. Surgically anastomosed bowel is seen along the medial aspect of the mid and lower right abdomen, consistent with the patient's history of prior subtotal colectomy. Numerous dilated small bowel loops are seen throughout the left abdomen (maximum small bowel diameter of approximately 4.1 cm). A transition zone is seen within the posterior aspect of the mid  left abdomen (axial CT images 115 through 122, CT series number 5). Noninflamed diverticula are seen throughout the sigmoid colon.  Lymphatic: No abnormal abdominal or pelvic lymph nodes are identified.  Reproductive: The prostate gland is moderately enlarged.  Other: No abdominal wall hernia or abnormality. No abdominopelvic ascites.  Musculoskeletal: A total right hip replacement is seen with associated streak artifact and subsequently limited evaluation of the adjacent osseous and soft tissue structures.  Marked severity degenerative changes are seen at the level of L4-L5 and L5-S1.  Review of the MIP images confirms the above findings.  IMPRESSION: 1. Findings consistent with a partial small bowel obstruction. 2. Extensive postoperative changes consistent with the patient's history of prior subtotal colectomy. 3. Evidence of prior median sternotomy the/CABG. 4. Prior splenectomy and left nephrectomy. 5. Sigmoid diverticulosis.   Electronically Signed   By: Virgina Norfolk M.D.   On: 12/02/2020 03:34  Assessment/Plan:  86 y.o. male with small bowel obstruction, complicated by pertinent comorbidities including diabetes, chronic kidney disease, pancreatic insufficiency.  Patient with small bowel obstruction most likely due to adhesions from previous abdominal surgeries.  At the moment of my evaluation patient with improved abdominal physical exam with mildly distended abdomen and soft.  Patient reported he started passing gas last night.  I will order Gastrografin challenge for evaluation of contrast getting into the residual large intestine.  I encouraged the patient to ambulate.  We will continue to suction in the meantime.  I discussed the plan with the patient and his wife and they both agreed to proceed with plan.  I will follow closely.  Arnold Long, MD

## 2020-12-02 NOTE — Progress Notes (Addendum)
Patient ID: [name], male   DOB: February 06, 1935, 86 y.o.   MRN: [id number] [facility] PROGRESS NOTE  Daniel Bray [id number] DOB: 1935-01-02 DOA: 12/02/2020 PCP: Daniel Rafter, MD  HPI/Subjective: Patient feels a little bit better this morning.  Admitted for partial small bowel obstruction.  NG tube was placed for partial small bowel obstruction and patient no longer has any nausea vomiting or abdominal pain.  Did have a lot of abdominal pain prior to NG tube.  He is passing gas.  Objective: Vitals:   12/02/20 1004 12/02/20 1030  BP: (!) 155/90 (!) 153/94  Pulse:  93  Resp:  12  Temp:    SpO2:  93%    Intake/Output Summary (Last 24 hours) at 12/02/2020 1328 Last data filed at 12/02/2020 1059 Gross per 24 hour  Intake 1248.73 ml  Output 900 ml  Net 348.73 ml   Filed Weights   12/01/20 2357  Weight: 65.3 kg    ROS: Review of Systems  Respiratory: Negative for cough and shortness of breath.   Cardiovascular: Negative for chest pain.  Gastrointestinal: Negative for abdominal pain, nausea and vomiting.   Exam: Physical Exam HENT:     Head: Normocephalic.     Mouth/Throat:     Pharynx: No oropharyngeal exudate.  Eyes:     General: Lids are normal.     Conjunctiva/sclera: Conjunctivae normal.  Cardiovascular:     Rate and Rhythm: Normal rate and regular rhythm.     Heart sounds: Normal heart sounds, S1 normal and S2 normal.  Pulmonary:     Breath sounds: No decreased breath sounds, wheezing, rhonchi or rales.  Abdominal:     General: Bowel sounds are decreased.     Palpations: Abdomen is soft.     Tenderness: There is no abdominal tenderness.  Musculoskeletal:     Right lower leg: No swelling.     Left lower leg: No swelling.  Skin:    General: Skin is warm.     Findings: No rash.  Neurological:     Mental Status: He is alert and oriented to person, place, and time.       Data Reviewed: Basic Metabolic Panel: Recent Labs  Lab  12/02/20 0022  NA 136  K 4.5  CL 102  CO2 26  GLUCOSE 269*  BUN 34*  CREATININE 1.63*  CALCIUM 10.5*   Liver Function Tests: Recent Labs  Lab 12/02/20 0022  AST 40  ALT 36  ALKPHOS 51  BILITOT 0.9  PROT 7.4  ALBUMIN 4.1   Recent Labs  Lab 12/02/20 0022  LIPASE 20   CBC: Recent Labs  Lab 12/02/20 0022  WBC 16.4*  NEUTROABS 9.7*  HGB 15.8  HCT 48.6  MCV 102.7*  PLT 233   BNP (last 3 results) Recent Labs    01/24/20 1116  BNP 204.6*     CBG: Recent Labs  Lab 12/02/20 0001 12/02/20 0519 12/02/20 0820  GLUCAP 262* 209* 133*    Recent Results (from the past 240 hour(s))  Resp Panel by RT-PCR (Flu A&B, Covid) Nasopharyngeal Swab     Status: None   Collection Time: 12/02/20  4:45 AM   Specimen: Nasopharyngeal Swab; Nasopharyngeal(NP) swabs in vial transport medium  Result Value Ref Range Status   SARS Coronavirus 2 by RT PCR NEGATIVE NEGATIVE Final    Comment: (NOTE) SARS-CoV-2 target nucleic acids are NOT DETECTED.  The SARS-CoV-2 RNA is generally detectable in upper respiratory specimens during the acute phase of  infection. The lowest concentration of SARS-CoV-2 viral copies this assay can detect is 138 copies/mL. A negative result does not preclude SARS-Cov-2 infection and should not be used as the sole basis for treatment or other patient management decisions. A negative result may occur with  improper specimen collection/handling, submission of specimen other than nasopharyngeal swab, presence of viral mutation(s) within the areas targeted by this assay, and inadequate number of viral copies(<138 copies/mL). A negative result must be combined with clinical observations, patient history, and epidemiological information. The expected result is Negative.  Fact Sheet for Patients:  EntrepreneurPulse.com.au  Fact Sheet for Healthcare Providers:  IncredibleEmployment.be  This test is no t yet approved or  cleared by the [place] FDA and  has been authorized for detection and/or diagnosis of SARS-CoV-2 by FDA under an Emergency Use Authorization (EUA). This EUA will remain  in effect (meaning this test can be used) for the duration of the COVID-19 declaration under Section 564(b)(1) of the Act, 21 U.S.C.section 360bbb-3(b)(1), unless the authorization is terminated  or revoked sooner.       Influenza A by PCR NEGATIVE NEGATIVE Final   Influenza B by PCR NEGATIVE NEGATIVE Final    Comment: (NOTE) The Xpert Xpress SARS-CoV-2/FLU/RSV plus assay is intended as an aid in the diagnosis of influenza from Nasopharyngeal swab specimens and should not be used as a sole basis for treatment. Nasal washings and aspirates are unacceptable for Xpert Xpress SARS-CoV-2/FLU/RSV testing.  Fact Sheet for Patients: EntrepreneurPulse.com.au  Fact Sheet for Healthcare Providers: IncredibleEmployment.be  This test is not yet approved or cleared by the [place] FDA and has been authorized for detection and/or diagnosis of SARS-CoV-2 by FDA under an Emergency Use Authorization (EUA). This EUA will remain in effect (meaning this test can be used) for the duration of the COVID-19 declaration under Section 564(b)(1) of the Act, 21 U.S.C. section 360bbb-3(b)(1), unless the authorization is terminated or revoked.  Performed at [facility], [address], [place], [place] [address]      Studies: DG Chest Portable 1 View  Result Date: 12/02/2020 CLINICAL DATA:  86 year old male NG tube placement. EXAM: PORTABLE CHEST 1 VIEW COMPARISON:  CT Chest, Abdomen, and Pelvis today are reported separately. 0055 hours the same day. FINDINGS: Portable AP upright view at 0445 hours. Enteric tube tip in the left upper quadrant, but side hole projects at the level of the diaphragm. Low lung volumes. Mediastinal contours remain normal. Prior CABG. Stable visible bowel  gas pattern. No pneumoperitoneum identified. Numerous upper abdominal surgical clips. IMPRESSION: 1. Enteric tube placed into the stomach but side hole at the level of the GEJ. Advance 6 cm to ensure side hole placement in the stomach. 2. Stable visible bowel gas pattern. Electronically Signed   By: Daniel Bray M.D.   On: 12/02/2020 06:44   CT Angio Chest/Abd/Pel for Dissection W and/or Wo Contrast  Result Date: 12/02/2020 CLINICAL DATA:  Epigastric pain. EXAM: CT ANGIOGRAPHY CHEST, ABDOMEN AND PELVIS TECHNIQUE: Non-contrast CT of the chest was initially obtained. Multidetector CT imaging through the chest, abdomen and pelvis was performed using the standard protocol during bolus administration of intravenous contrast. Multiplanar reconstructed images and MIPs were obtained and reviewed to evaluate the vascular anatomy. CONTRAST:  19mL OMNIPAQUE IOHEXOL 350 MG/ML SOLN COMPARISON:  None. FINDINGS: CTA CHEST FINDINGS Cardiovascular: There is mild calcification of the aortic arch without evidence of aneurysmal dilatation or dissection satisfactory opacification of the pulmonary arteries to the segmental level. No evidence of  pulmonary embolism. Normal heart size with marked severity coronary artery calcification. Coronary artery vascular clips are noted. No pericardial effusion. Mediastinum/Nodes: No enlarged mediastinal, hilar, or axillary lymph nodes. Thyroid gland, trachea, and esophagus demonstrate no significant findings. Lungs/Pleura: Very mild atelectasis is seen within the inferior aspect of the left upper lobe and posterior aspects of the bilateral lung bases. There is no evidence of a pleural effusion or pneumothorax. Musculoskeletal: Multiple sternal wires are noted. Multilevel degenerative changes seen throughout the thoracic spine. Review of the MIP images confirms the above findings. CTA ABDOMEN AND PELVIS FINDINGS VASCULAR Aorta: Normal caliber aorta without aneurysm, dissection, vasculitis or  significant stenosis. Celiac: Patent without evidence of aneurysm, dissection, vasculitis or significant stenosis. SMA: Narrowing along its origin without evidence of aneurysm, dissection, vasculitis or significant stenosis. Renals: The right renal artery is patent without evidence of aneurysm, dissection, vasculitis, fibromuscular dysplasia or significant stenosis. IMA: Patent without evidence of aneurysm, dissection, vasculitis or significant stenosis. Inflow: Patent without evidence of aneurysm, dissection, vasculitis or significant stenosis. Veins: No obvious venous abnormality within the limitations of this arterial phase study. Review of the MIP images confirms the above findings. NON-VASCULAR Hepatobiliary: No focal liver abnormality is seen. No gallstones, gallbladder wall thickening, or biliary dilatation. Pancreas: Fatty replacement of the pancreatic parenchyma is seen. A stable 2.4 cm x 2.2 cm enhancing area of soft tissue attenuation is again seen along the expected region of the pancreatic head. Spleen: The spleen is surgically absent. Adrenals/Urinary Tract: The right adrenal gland is unremarkable. The left kidney is surgically absent. Mild cortical scarring is seen involving the right kidney without evidence of renal calculi or hydronephrosis. Bladder is unremarkable. Stomach/Bowel: The stomach is moderately distended. Surgically anastomosed bowel is seen along the medial aspect of the mid and lower right abdomen, consistent with the patient's history of prior subtotal colectomy. Numerous dilated small bowel loops are seen throughout the left abdomen (maximum small bowel diameter of approximately 4.1 cm). A transition zone is seen within the posterior aspect of the mid left abdomen (axial CT images 115 through 122, CT series number 5). Noninflamed diverticula are seen throughout the sigmoid colon. Lymphatic: No abnormal abdominal or pelvic lymph nodes are identified. Reproductive: The prostate gland  is moderately enlarged. Other: No abdominal wall hernia or abnormality. No abdominopelvic ascites. Musculoskeletal: A total right hip replacement is seen with associated streak artifact and subsequently limited evaluation of the adjacent osseous and soft tissue structures. Marked severity degenerative changes are seen at the level of L4-L5 and L5-S1. Review of the MIP images confirms the above findings. IMPRESSION: 1. Findings consistent with a partial small bowel obstruction. 2. Extensive postoperative changes consistent with the patient's history of prior subtotal colectomy. 3. Evidence of prior median sternotomy the/CABG. 4. Prior splenectomy and left nephrectomy. 5. Sigmoid diverticulosis. Electronically Signed   By: Virgina Norfolk M.D.   On: 12/02/2020 03:34    Scheduled Meds: . cloNIDine  0.1 mg Transdermal Weekly  . enoxaparin (LOVENOX) injection  40 mg Subcutaneous Q24H  . insulin aspart  0-9 Units Subcutaneous Q4H   Continuous Infusions: . lactated ringers 100 mL/hr at 12/02/20 1059    Assessment/Plan:  1. Partial small bowel obstruction.  Patient with NG tube.  General surgery to do a Gastrografin challenge.  Ambulation.  IV fluids. 2. Syncope secondary to pain 3. Accelerated hypertension.  Unable to give any oral medication secondary to NG tube.  As needed labetalol.  Start clonidine patch. 4. Chronic kidney disease stage IIIb  5. Type 2 diabetes mellitus with hyperlipidemia.  Sliding scale insulin.  Holding oral medications. 6. Hypothyroidism unspecified.  Holding levothyroxine 7. Diabetic neuropathy holding Neurontin 8. History of pancreatic cancer on pancreatic enzymes holding until started on a diet.        Code Status:     Code Status Orders  (From admission, onward)         Start     Ordered   12/02/20 0400  Full code  Continuous        12/02/20 0404        Code Status History    Date Active Date Inactive Code Status Order ID Comments User Context    05/28/2020 1125 05/30/2020 1934 Full Code [id number]  Ivor Costa, MD ED   01/24/2020 2009 01/25/2020 1855 Full Code [id number]  Ivor Costa, MD ED   10/12/2019 1248 10/13/2019 2028 Full Code [id number]  Collier Bullock, MD ED   Advance Care Planning Activity     Family Communication: Wife at bedside Disposition Plan: Status is: Inpatient  Dispo: The patient is from: Home              Anticipated d/c is to: Home              Patient currently being treated for partial small bowel obstruction   Difficult to place patient.  No.  Consultants:  General surgery  Time spent: 73 minutes  [place]

## 2020-12-03 ENCOUNTER — Inpatient Hospital Stay: Payer: Medicare HMO

## 2020-12-03 LAB — GLUCOSE, CAPILLARY
Glucose-Capillary: 116 mg/dL — ABNORMAL HIGH (ref 70–99)
Glucose-Capillary: 131 mg/dL — ABNORMAL HIGH (ref 70–99)
Glucose-Capillary: 136 mg/dL — ABNORMAL HIGH (ref 70–99)
Glucose-Capillary: 142 mg/dL — ABNORMAL HIGH (ref 70–99)
Glucose-Capillary: 147 mg/dL — ABNORMAL HIGH (ref 70–99)
Glucose-Capillary: 150 mg/dL — ABNORMAL HIGH (ref 70–99)
Glucose-Capillary: 155 mg/dL — ABNORMAL HIGH (ref 70–99)

## 2020-12-03 LAB — BASIC METABOLIC PANEL WITH GFR
Anion gap: 14 (ref 5–15)
BUN: 36 mg/dL — ABNORMAL HIGH (ref 8–23)
CO2: 24 mmol/L (ref 22–32)
Calcium: 10.6 mg/dL — ABNORMAL HIGH (ref 8.9–10.3)
Chloride: 103 mmol/L (ref 98–111)
Creatinine, Ser: 1.54 mg/dL — ABNORMAL HIGH (ref 0.61–1.24)
GFR, Estimated: 44 mL/min — ABNORMAL LOW (ref >60)
Glucose, Bld: 150 mg/dL — ABNORMAL HIGH (ref 70–99)
Potassium: 4.5 mmol/L (ref 3.5–5.1)
Sodium: 141 mmol/L (ref 135–145)

## 2020-12-03 MED ORDER — LORAZEPAM 2 MG/ML IJ SOLN
0.5000 mg | Freq: Four times a day (QID) | INTRAMUSCULAR | Status: DC | PRN
  Administered 2020-12-03: 0.5 mg via INTRAVENOUS
  Filled 2020-12-03: qty 1

## 2020-12-03 MED ORDER — HYDRALAZINE HCL 25 MG PO TABS
25.0000 mg | ORAL_TABLET | Freq: Three times a day (TID) | ORAL | Status: DC
  Administered 2020-12-03 – 2020-12-06 (×10): 25 mg via ORAL
  Filled 2020-12-03 (×10): qty 1

## 2020-12-03 MED ORDER — LEVOTHYROXINE SODIUM 100 MCG/5ML IV SOLN
37.5000 ug | Freq: Every day | INTRAVENOUS | Status: DC
  Filled 2020-12-03: qty 5

## 2020-12-03 MED ORDER — SODIUM CHLORIDE 0.9 % IV SOLN
12.5000 mg | Freq: Once | INTRAVENOUS | Status: AC
  Administered 2020-12-03: 12.5 mg via INTRAVENOUS
  Filled 2020-12-03: qty 12.5
  Filled 2020-12-03: qty 0.5

## 2020-12-03 MED ORDER — LISINOPRIL 20 MG PO TABS
20.0000 mg | ORAL_TABLET | Freq: Every day | ORAL | Status: DC
  Administered 2020-12-03 – 2020-12-06 (×4): 20 mg via ORAL
  Filled 2020-12-03 (×4): qty 1

## 2020-12-03 NOTE — Evaluation (Signed)
Physical Therapy Evaluation Patient Details Name: Daniel Bray MRN: [id number] DOB: October 07, 1934 Today's Date: 12/03/2020   History of Present Illness    86 year old male with history of HTN, DM, hypothyroidism, CAD status post CABG, CKD lllb, history of renal cell, colon and pancreatic cancer status post nephrectomy, subtotal colectomy and multiple other abdominal surgeries, presented to hospital ED 12/02/2020 with epigastric pain and with syncopal episode while in the emergency room waiting room. Admitted for partial small bowel obstruction, syncope, elevated troponin. General surgery to do a Gastrografin challenge.  Clinical Impression  Patient alert and oriented upon arrival and able to provide detailed history. Wife and daughter present. Patient lives with his wife in a single story home with ramped entrance. Prior to hospitalization he was I with all aspects of care and mobility including assistant coaching softball and doing yardwork. Upon PT evaluation, patient was I with bed mobility and needed supervision for transfers and ambulation for safety. Patient demo propensity to reach for furniture to steady himself when ambulating but was able to maintain balance while walking with no UE support. Did demonstrate slight unsteadiness during ambulation but was able to correct it without physical assistance. Patient appears to have experienced a decline from prior level of function but has sufficient support and functional independence to discharge home with HHPT at this time. Patient would benefit from continued management of limiting condition by skilled physical therapist to address remaining impairments and functional limitations to work towards stated goals and return to PLOF or maximal functional independence.       Follow Up Recommendations Home health PT    Equipment Recommendations    none   Recommendations for Other Services       Precautions / Restrictions  Precautions Precautions: Fall Restrictions Weight Bearing Restrictions: No      Mobility  Bed Mobility Overal bed mobility: Independent             General bed mobility comments: supine <> sit without assistance    Transfers Overall transfer level: Needs assistance Equipment used: None Transfers: Sit to/from Stand Sit to Stand: Supervision;Min guard         General transfer comment: Patient completed transfers at bed and chair with BUE support but no physical assistance from PT.  Ambulation/Gait Ambulation/Gait assistance: Min guard;Supervision Gait Distance (Feet): 200 Feet (2x200 feet) Assistive device: None Gait Pattern/deviations: WFL(Within Functional Limits) Gait velocity: WFL   General Gait Details: Patient ambulated out of room and around nurses station two times with CGA-SBA. When near objects in room he reaches out to hold on but requests not to use RW and is able to maintain balance when not holding on. Does wobble a bit occasionally but is able to recover without assistance.  Stairs            Wheelchair Mobility    Modified Rankin (Stroke Patients Only)       Balance Overall balance assessment: Needs assistance Sitting-balance support: No upper extremity supported Sitting balance-Leahy Scale: Good Sitting balance - Comments: steady sitting edge of bed   Standing balance support: No upper extremity supported;During functional activity Standing balance-Leahy Scale: Good Standing balance comment: minimal unsteadiness noteded during ambulation but able to recover with no physical assistance.                             Pertinent Vitals/Pain Pain Assessment: 0-10 Pain Score:  (no rating given) Pain Location: left upper abdominal quadrant  when he coughs Pain Intervention(s): Monitored during session;Limited activity within patient's tolerance    Home Living Family/patient expects to be discharged to:: Private residence Living  Arrangements: Spouse/significant other Available Help at Discharge: Family;Available 24 hours/day Type of Home: House Home Access: Ramped entrance     Home Layout: One level Home Equipment: Grab bars - tub/shower;Cane - quad;Cane - single point;Walker - 2 wheels;Bedside commode Additional Comments: Pt does not use any AD/DME at home at present    Prior Function Level of Independence: Independent         Comments: Patient reports he is I with all aspects of care and does yardwork, assistant coaches a softballt team.     Hand Dominance        Extremity/Trunk Assessment                Communication   Communication: No difficulties  Cognition Arousal/Alertness: Awake/alert Behavior During Therapy: WFL for tasks assessed/performed Overall Cognitive Status: Within Functional Limits for tasks assessed                                        General Comments General comments (skin integrity, edema, etc.): SpO2 dropped to 93% and HR rose to 115 bpm at end of first ambulation.    Exercises Other Exercises Other Exercises: Practiced functional mobility training, educated on role of PT in acute care setting, discharge reccomendations.   Assessment/Plan    PT Assessment Patient needs continued PT services  PT Problem List Decreased strength;Cardiopulmonary status limiting activity;Decreased activity tolerance;Decreased balance;Decreased mobility       PT Treatment Interventions DME instruction;Gait training;Functional mobility training;Therapeutic activities;Therapeutic exercise;Balance training;Patient/family education;Neuromuscular re-education    PT Goals (Current goals can be found in the Care Plan section)  Acute Rehab PT Goals Patient Stated Goal: to get better and go home PT Goal Formulation: With patient Time For Goal Achievement: 12/17/20 Potential to Achieve Goals: Good    Frequency Min 2X/week   Barriers to discharge         Co-evaluation               AM-PAC PT "6 Clicks" Mobility  Outcome Measure Help needed turning from your back to your side while in a flat bed without using bedrails?: None Help needed moving from lying on your back to sitting on the side of a flat bed without using bedrails?: None Help needed moving to and from a bed to a chair (including a wheelchair)?: A Little Help needed standing up from a chair using your arms (e.g., wheelchair or bedside chair)?: A Little Help needed to walk in hospital room?: A Little Help needed climbing 3-5 steps with a railing? : A Little 6 Click Score: 20    End of Session Equipment Utilized During Treatment: Gait belt Activity Tolerance: Patient tolerated treatment well Patient left: in bed;with call bell/phone within reach;with bed alarm set;with family/visitor present Nurse Communication: Mobility status PT Visit Diagnosis: Unsteadiness on feet (R26.81);Muscle weakness (generalized) (M62.81)    Time: 0017-4944 PT Time Calculation (min) (ACUTE ONLY): 30 min   Charges:   PT Evaluation $PT Eval Moderate Complexity: 1 Mod          Miesha Bachmann R. Graylon Good, PT, DPT 12/03/20, 12:00 PM

## 2020-12-03 NOTE — Progress Notes (Addendum)
Patient ID: TRIG MCBRYAR, male   DOB: 1934-12-21, 86 y.o.   MRN: [id number] [facility] PROGRESS NOTE  Daniel Bray BLT:[id number] DOB: [birth year] DOA: 12/02/2020 PCP: Elza Rafter, MD  HPI/Subjective: Patient felt okay in the morning but in the afternoon did have more symptoms.  Admitted with small bowel obstruction.  Objective: Vitals:   12/03/20 0803 12/03/20 1251  BP: (!) 170/76 (!) 180/119  Pulse: 90 (!) 102  Resp: 16 17  Temp: 97.9 F (36.6 C) 97.9 F (36.6 C)  SpO2: 96% 99%    Intake/Output Summary (Last 24 hours) at 12/03/2020 1343 Last data filed at 12/03/2020 0618 Gross per 24 hour  Intake 1415.4 ml  Output 750 ml  Net 665.4 ml   Filed Weights   12/01/20 2357  Weight: 65.3 kg    ROS: Review of Systems  Respiratory: Negative for shortness of breath.   Cardiovascular: Negative for chest pain.  Gastrointestinal: Negative for abdominal pain, nausea and vomiting.   Exam: Physical Exam HENT:     Head: Normocephalic.     Mouth/Throat:     Pharynx: No oropharyngeal exudate.  Eyes:     General: Lids are normal.     Conjunctiva/sclera: Conjunctivae normal.     Pupils: Pupils are equal, round, and reactive to light.  Cardiovascular:     Rate and Rhythm: Normal rate and regular rhythm.     Heart sounds: Normal heart sounds, S1 normal and S2 normal.  Pulmonary:     Breath sounds: No decreased breath sounds, wheezing, rhonchi or rales.  Abdominal:     Palpations: Abdomen is soft.     Tenderness: There is no abdominal tenderness.  Musculoskeletal:     Right ankle: No swelling.     Left ankle: No swelling.  Skin:    General: Skin is warm.     Findings: No rash.     Comments: Skin tear left back.  Patient thinks it was the defibrillator pads that were taken off or lying back on something.  Neurological:     Mental Status: He is alert and oriented to person, place, and time.       Data Reviewed: Basic Metabolic Panel: Recent Labs   Lab 12/02/20 0022 12/03/20 0759  NA 136 141  K 4.5 4.5  CL 102 103  CO2 26 24  GLUCOSE 269* 150*  BUN 34* 36*  CREATININE 1.63* 1.54*  CALCIUM 10.5* 10.6*   Liver Function Tests: Recent Labs  Lab 12/02/20 0022  AST 40  ALT 36  ALKPHOS 51  BILITOT 0.9  PROT 7.4  ALBUMIN 4.1   Recent Labs  Lab 12/02/20 0022  LIPASE 20   CBC: Recent Labs  Lab 12/02/20 0022  WBC 16.4*  NEUTROABS 9.7*  HGB 15.8  HCT 48.6  MCV 102.7*  PLT 233   BNP (last 3 results) Recent Labs    01/24/20 1116  BNP 204.6*    CBG: Recent Labs  Lab 12/02/20 2008 12/03/20 0048 12/03/20 0421 12/03/20 0806 12/03/20 1148  GLUCAP 108* 142* 131* 150* 147*    Recent Results (from the past 240 hour(s))  Resp Panel by RT-PCR (Flu A&B, Covid) Nasopharyngeal Swab     Status: None   Collection Time: 12/02/20  4:45 AM   Specimen: Nasopharyngeal Swab; Nasopharyngeal(NP) swabs in vial transport medium  Result Value Ref Range Status   SARS Coronavirus 2 by RT PCR NEGATIVE NEGATIVE Final    Comment: (NOTE) SARS-CoV-2 target nucleic acids are NOT DETECTED.  The SARS-CoV-2 RNA is generally detectable in upper respiratory specimens during the acute phase of infection. The lowest concentration of SARS-CoV-2 viral copies this assay can detect is 138 copies/mL. A negative result does not preclude SARS-Cov-2 infection and should not be used as the sole basis for treatment or other patient management decisions. A negative result may occur with  improper specimen collection/handling, submission of specimen other than nasopharyngeal swab, presence of viral mutation(s) within the areas targeted by this assay, and inadequate number of viral copies(<138 copies/mL). A negative result must be combined with clinical observations, patient history, and epidemiological information. The expected result is Negative.  Fact Sheet for Patients:  EntrepreneurPulse.com.au  Fact Sheet for Healthcare  Providers:  IncredibleEmployment.be  This test is no t yet approved or cleared by the [place] FDA and  has been authorized for detection and/or diagnosis of SARS-CoV-2 by FDA under an Emergency Use Authorization (EUA). This EUA will remain  in effect (meaning this test can be used) for the duration of the COVID-19 declaration under Section 564(b)(1) of the Act, 21 U.S.C.section 360bbb-3(b)(1), unless the authorization is terminated  or revoked sooner.       Influenza A by PCR NEGATIVE NEGATIVE Final   Influenza B by PCR NEGATIVE NEGATIVE Final    Comment: (NOTE) The Xpert Xpress SARS-CoV-2/FLU/RSV plus assay is intended as an aid in the diagnosis of influenza from Nasopharyngeal swab specimens and should not be used as a sole basis for treatment. Nasal washings and aspirates are unacceptable for Xpert Xpress SARS-CoV-2/FLU/RSV testing.  Fact Sheet for Patients: EntrepreneurPulse.com.au  Fact Sheet for Healthcare Providers: IncredibleEmployment.be  This test is not yet approved or cleared by the [place] FDA and has been authorized for detection and/or diagnosis of SARS-CoV-2 by FDA under an Emergency Use Authorization (EUA). This EUA will remain in effect (meaning this test can be used) for the duration of the COVID-19 declaration under Section 564(b)(1) of the Act, 21 U.S.C. section 360bbb-3(b)(1), unless the authorization is terminated or revoked.  Performed at [facility], [address], [place], [place] [address]      Studies: DG Chest Portable 1 View  Result Date: 12/02/2020 CLINICAL DATA:  86 year old male NG tube placement. EXAM: PORTABLE CHEST 1 VIEW COMPARISON:  CT Chest, Abdomen, and Pelvis today are reported separately. 0055 hours the same day. FINDINGS: Portable AP upright view at 0445 hours. Enteric tube tip in the left upper quadrant, but side hole projects at the level of the  diaphragm. Low lung volumes. Mediastinal contours remain normal. Prior CABG. Stable visible bowel gas pattern. No pneumoperitoneum identified. Numerous upper abdominal surgical clips. IMPRESSION: 1. Enteric tube placed into the stomach but side hole at the level of the GEJ. Advance 6 cm to ensure side hole placement in the stomach. 2. Stable visible bowel gas pattern. Electronically Signed   By: Genevie Ann M.D.   On: 12/02/2020 06:44   DG Abd Portable 1V-Small Bowel Obstruction Protocol-initial, 8 hr delay  Result Date: 12/02/2020 CLINICAL DATA:  Small-bowel obstruction EXAM: PORTABLE ABDOMEN - 1 VIEW COMPARISON:  12/02/2020 FINDINGS: Two supine frontal views of the abdomen and pelvis demonstrate enteric catheter within the gastric fundus. Oral contrast is seen within the gastric fundus. Gaseous distention of the small bowel within the left upper quadrant unchanged since preceding CT. Excreted contrast within the urinary bladder. No masses or abnormal calcifications. Extensive postsurgical changes with numerous clips throughout the retroperitoneum. IMPRESSION: 1. Continued dilated small bowel left upper quadrant consistent  with small-bowel obstruction. 2. Enteric catheter within the gastric lumen, with contrast filling the gastric fundus. Electronically Signed   By: Randa Ngo M.D.   On: 12/02/2020 19:06   CT Angio Chest/Abd/Pel for Dissection W and/or Wo Contrast  Result Date: 12/02/2020 CLINICAL DATA:  Epigastric pain. EXAM: CT ANGIOGRAPHY CHEST, ABDOMEN AND PELVIS TECHNIQUE: Non-contrast CT of the chest was initially obtained. Multidetector CT imaging through the chest, abdomen and pelvis was performed using the standard protocol during bolus administration of intravenous contrast. Multiplanar reconstructed images and MIPs were obtained and reviewed to evaluate the vascular anatomy. CONTRAST:  181mL OMNIPAQUE IOHEXOL 350 MG/ML SOLN COMPARISON:  None. FINDINGS: CTA CHEST FINDINGS Cardiovascular: There is  mild calcification of the aortic arch without evidence of aneurysmal dilatation or dissection satisfactory opacification of the pulmonary arteries to the segmental level. No evidence of pulmonary embolism. Normal heart size with marked severity coronary artery calcification. Coronary artery vascular clips are noted. No pericardial effusion. Mediastinum/Nodes: No enlarged mediastinal, hilar, or axillary lymph nodes. Thyroid gland, trachea, and esophagus demonstrate no significant findings. Lungs/Pleura: Very mild atelectasis is seen within the inferior aspect of the left upper lobe and posterior aspects of the bilateral lung bases. There is no evidence of a pleural effusion or pneumothorax. Musculoskeletal: Multiple sternal wires are noted. Multilevel degenerative changes seen throughout the thoracic spine. Review of the MIP images confirms the above findings. CTA ABDOMEN AND PELVIS FINDINGS VASCULAR Aorta: Normal caliber aorta without aneurysm, dissection, vasculitis or significant stenosis. Celiac: Patent without evidence of aneurysm, dissection, vasculitis or significant stenosis. SMA: Narrowing along its origin without evidence of aneurysm, dissection, vasculitis or significant stenosis. Renals: The right renal artery is patent without evidence of aneurysm, dissection, vasculitis, fibromuscular dysplasia or significant stenosis. IMA: Patent without evidence of aneurysm, dissection, vasculitis or significant stenosis. Inflow: Patent without evidence of aneurysm, dissection, vasculitis or significant stenosis. Veins: No obvious venous abnormality within the limitations of this arterial phase study. Review of the MIP images confirms the above findings. NON-VASCULAR Hepatobiliary: No focal liver abnormality is seen. No gallstones, gallbladder wall thickening, or biliary dilatation. Pancreas: Fatty replacement of the pancreatic parenchyma is seen. A stable 2.4 cm x 2.2 cm enhancing area of soft tissue attenuation is  again seen along the expected region of the pancreatic head. Spleen: The spleen is surgically absent. Adrenals/Urinary Tract: The right adrenal gland is unremarkable. The left kidney is surgically absent. Mild cortical scarring is seen involving the right kidney without evidence of renal calculi or hydronephrosis. Bladder is unremarkable. Stomach/Bowel: The stomach is moderately distended. Surgically anastomosed bowel is seen along the medial aspect of the mid and lower right abdomen, consistent with the patient's history of prior subtotal colectomy. Numerous dilated small bowel loops are seen throughout the left abdomen (maximum small bowel diameter of approximately 4.1 cm). A transition zone is seen within the posterior aspect of the mid left abdomen (axial CT images 115 through 122, CT series number 5). Noninflamed diverticula are seen throughout the sigmoid colon. Lymphatic: No abnormal abdominal or pelvic lymph nodes are identified. Reproductive: The prostate gland is moderately enlarged. Other: No abdominal wall hernia or abnormality. No abdominopelvic ascites. Musculoskeletal: A total right hip replacement is seen with associated streak artifact and subsequently limited evaluation of the adjacent osseous and soft tissue structures. Marked severity degenerative changes are seen at the level of L4-L5 and L5-S1. Review of the MIP images confirms the above findings. IMPRESSION: 1. Findings consistent with a partial small bowel obstruction. 2. Extensive  postoperative changes consistent with the patient's history of prior subtotal colectomy. 3. Evidence of prior median sternotomy the/CABG. 4. Prior splenectomy and left nephrectomy. 5. Sigmoid diverticulosis. Electronically Signed   By: Virgina Norfolk M.D.   On: 12/02/2020 03:34    Scheduled Meds: . cloNIDine  0.2 mg Transdermal Weekly  . enoxaparin (LOVENOX) injection  40 mg Subcutaneous Q24H  . hydrALAZINE  25 mg Oral TID  . insulin aspart  0-9 Units  Subcutaneous Q4H  . lisinopril  20 mg Oral Daily   Continuous Infusions: . lactated ringers 100 mL/hr at 12/02/20 2234    Assessment/Plan:  1. Small bowel obstruction.  NG tube.  IV fluids.  Gastrografin challenge did not move much.  General surgery will reevaluate tomorrow and if no better may take to the operating room. 2. Syncope secondary to pain. 3. Accelerated hypertension.  Limited with patient having an NG tube.  Continue clonidine patch and as needed IV labetalol.  We will try to start oral medications including lisinopril and hydralazine. 4. Chronic kidney disease stage IIIb.  Creatinine improved a little bit with IV fluid hydration down to 1.54 today. 5. Type 2 diabetes mellitus with hyperlipidemia.  Continue sliding scale insulin.  Holding oral medications and cholesterol medication. 6. Hypothyroidism unspecified.  Will start IV levothyroxine tomorrow. 7. Diabetic neuropathy.  Holding Neurontin 8. History of pancreatic cancer on pancreatic enzymes chronically.  Holding pancreatic enzymes until started on a diet. 9. Skin tear on his left back.  Nursing staff already have covered.        Code Status:     Code Status Orders  (From admission, onward)         Start     Ordered   12/02/20 0400  Full code  Continuous        12/02/20 0404        Code Status History    Date Active Date Inactive Code Status Order ID Comments User Context   05/28/2020 1125 05/30/2020 1934 Full Code [id number]  Ivor Costa, MD ED   01/24/2020 2009 01/25/2020 1855 Full Code [id number]  Ivor Costa, MD ED   10/12/2019 1248 10/13/2019 2028 Full Code [id number]  Collier Bullock, MD ED   Advance Care Planning Activity     Family Communication: Wife at the bedside Disposition Plan: Status is: Inpatient  Dispo: The patient is from: Home              Anticipated d/c is to: Home              Patient currently being watched closely for small bowel obstruction with conservative management.  General  surgery to evaluate tomorrow on whether surgical management will be needed or not.   Difficult to place patient.  No  Consultants:  General surgery  Time spent: 28 minutes  [place]

## 2020-12-03 NOTE — Progress Notes (Signed)
Patient ID: Daniel Bray, male   DOB: [birth year], 86 y.o.   MRN: [id number]     [facility] Hospital Day(s): 1.   Post op day(s):  Marland Kitchen   Interval History: Patient seen and examined, patient continue feeling better on consult.  He reports that he is having nausea and having episode of vomiting despite having NGT in place.  He denies passing gas.  Pain localized in the upper abdomen.  No pain radiation.  No alleviating or aggravating factors.  Vital signs in last 24 hours: [min-max] current  Temp:  [97.3 F (36.3 C)-99.1 F (37.3 C)] 97.9 F (36.6 C) (05/01 1251) Pulse Rate:  [90-110] 102 (05/01 1251) Resp:  [12-17] 17 (05/01 1251) BP: (151-183)/(76-119) 180/119 (05/01 1251) SpO2:  [92 %-99 %] 99 % (05/01 1251)     Height: 5\' 10"  (177.8 cm) Weight: 65.3 kg BMI (Calculated): 20.66   Physical Exam:  Constitutional: alert, cooperative and no distress  Gastrointestinal: soft, and distended  Labs:  CBC Latest Ref Rng & Units 12/02/2020 05/30/2020 05/29/2020  WBC 4.0 - 10.5 K/uL 16.4(H) 27.1(H) 30.2(H)  Hemoglobin 13.0 - 17.0 g/dL 15.8 13.0 14.1  Hematocrit 39.0 - 52.0 % 48.6 39.8 42.2  Platelets 150 - 400 K/uL 233 159 171   CMP Latest Ref Rng & Units 12/03/2020 12/02/2020 05/30/2020  Glucose 70 - 99 mg/dL 150(H) 269(H) 137(H)  BUN 8 - 23 mg/dL 36(H) 34(H) 34(H)  Creatinine 0.61 - 1.24 mg/dL 1.54(H) 1.63(H) 1.61(H)  Sodium 135 - 145 mmol/L 141 136 133(L)  Potassium 3.5 - 5.1 mmol/L 4.5 4.5 4.7  Chloride 98 - 111 mmol/L 103 102 107  CO2 22 - 32 mmol/L 24 26 18(L)  Calcium 8.9 - 10.3 mg/dL 10.6(H) 10.5(H) 9.8  Total Protein 6.5 - 8.1 g/dL - 7.4 6.0(L)  Total Bilirubin 0.3 - 1.2 mg/dL - 0.9 1.0  Alkaline Phos 38 - 126 U/L - 51 51  AST 15 - 41 U/L - 40 20  ALT 0 - 44 U/L - 36 31    Imaging studies: New abdominal x-rays shows minimal progression of oral contrast.  Still with dilated small bowel loops.  No free air.   Assessment/Plan:  Patient continue with persistent  bowel obstruction.  24 hours after administration of Gastrografin patient with no progression of oral contrast to residual large intestine.  Patient feeling very uncomfortable.  I took an hour to be able to unclog the nasogastric tube.  I drain 1.1 L from his stomach of bilious output.  Patient also getting Phenergan at this moment.  I discussed with the patient and the family that since there has been no significant improvement I would highly consider proceeding with surgical management tomorrow.  I discussed with patient about exploratory laparotomy with lysis of addition and possible resection of intestine.  The patient and family understood and agreed with plan that if patient is not improving tomorrow we will proceed with surgical management.  This follow-up encounter was more than 30-minute most of the time counseling the patient and coordinating plan of care.  Arnold Long, MD

## 2020-12-03 NOTE — Progress Notes (Signed)
Patient ID: Daniel Bray, male   DOB: 07-25-1935, 86 y.o.   MRN: [id number]     [facility] [facility] Hospital Day(s): 1.   Post op day(s):  Marland Kitchen   Interval History: Patient seen and examined, no acute events or new complaints overnight. Patient reports feeling miserable today.  He reports feeling bloated.  He reports some pain on the left side of the abdomen.  The pain does not radiate to other part of body.  There has been no aggravating or alleviating factor at this pain.  He feels a lot of burping but denies passing gas rectally.  Follow-up abdominal x-ray yesterday 8 hours after ingestion of Gastrografin shows contrast in the stomach no significant movement of the contrast out of the stomach.  Vital signs in last 24 hours: [min-max] current  Temp:  [97.3 F (36.3 C)-99.1 F (37.3 C)] 97.9 F (36.6 C) (05/01 0803) Pulse Rate:  [90-110] 90 (05/01 0803) Resp:  [12-20] 16 (05/01 0803) BP: (151-190)/(76-112) 170/76 (05/01 0803) SpO2:  [92 %-96 %] 96 % (05/01 0803)     Height: 5\' 10"  (177.8 cm) Weight: 65.3 kg BMI (Calculated): 20.66   Physical Exam:  Constitutional: alert, cooperative and no distress  Respiratory: breathing non-labored at rest  Cardiovascular: regular rate and sinus rhythm  Gastrointestinal: soft, mild tender on left abdomen, mild-distended  Labs:  CBC Latest Ref Rng & Units 12/02/2020 05/30/2020 05/29/2020  WBC 4.0 - 10.5 K/uL 16.4(H) 27.1(H) 30.2(H)  Hemoglobin 13.0 - 17.0 g/dL 15.8 13.0 14.1  Hematocrit 39.0 - 52.0 % 48.6 39.8 42.2  Platelets 150 - 400 K/uL 233 159 171   CMP Latest Ref Rng & Units 12/03/2020 12/02/2020 05/30/2020  Glucose 70 - 99 mg/dL 150(H) 269(H) 137(H)  BUN 8 - 23 mg/dL 36(H) 34(H) 34(H)  Creatinine 0.61 - 1.24 mg/dL 1.54(H) 1.63(H) 1.61(H)  Sodium 135 - 145 mmol/L 141 136 133(L)  Potassium 3.5 - 5.1 mmol/L 4.5 4.5 4.7  Chloride 98 - 111 mmol/L 103 102 107  CO2 22 - 32 mmol/L 24 26 18(L)  Calcium 8.9 - 10.3 mg/dL 10.6(H) 10.5(H) 9.8   Total Protein 6.5 - 8.1 g/dL - 7.4 6.0(L)  Total Bilirubin 0.3 - 1.2 mg/dL - 0.9 1.0  Alkaline Phos 38 - 126 U/L - 51 51  AST 15 - 41 U/L - 40 20  ALT 0 - 44 U/L - 36 31    Imaging studies: I personally evaluated the images of the delay abdominal x-ray after Gastrografin ingestion.  No significant movement of oral contrast out of the stomach.  No free air.   Assessment/Plan:  86 y.o. male with small bowel obstruction, complicated by pertinent comorbidities including diabetes, chronic kidney disease, pancreatic insufficiency.  Patient today not feeling better than yesterday.  He is more bloated and more uncomfortable.  Abdominal x-ray shows no movement of the contrast out of the stomach.  This does not necessarily mean that there is a complete obstruction.  This could be of gastroparesis.  We will follow-up 24-hour delay abdominal x-ray today.  If patient continue feeling like he is feeling today and there is no much progress of oral contrast I discussed with the patient and the family about the possibility of needing surgical management during this admission for the management of small bowel obstruction.  Agreed to continue IV hydration and electrolytes replacement as necessary.  I encouraged the patient to ambulate.  We will continue to monitor and follow closely.  Arnold Long, MD

## 2020-12-04 ENCOUNTER — Inpatient Hospital Stay: Payer: Medicare HMO

## 2020-12-04 ENCOUNTER — Inpatient Hospital Stay: Payer: Medicare HMO | Admitting: Certified Registered Nurse Anesthetist

## 2020-12-04 ENCOUNTER — Encounter
Admission: EM | Disposition: A | Discharge status: Home / Self Care | Payer: Self-pay | Source: Home / Self Care | Attending: Internal Medicine

## 2020-12-04 LAB — GLUCOSE, CAPILLARY
Glucose-Capillary: 174 mg/dL — ABNORMAL HIGH (ref 70–99)
Glucose-Capillary: 156 mg/dL — ABNORMAL HIGH (ref 70–99)
Glucose-Capillary: 115 mg/dL — ABNORMAL HIGH (ref 70–99)
Glucose-Capillary: 116 mg/dL — ABNORMAL HIGH (ref 70–99)
Glucose-Capillary: 125 mg/dL — ABNORMAL HIGH (ref 70–99)

## 2020-12-04 SURGERY — LAPAROTOMY, EXPLORATORY
Anesthesia: Choice

## 2020-12-04 MED ORDER — SODIUM CHLORIDE 0.9 % IV SOLN
12.5000 mg | INTRAVENOUS | Status: DC | PRN
  Administered 2020-12-04: 12.5 mg via INTRAVENOUS
  Filled 2020-12-04 (×2): qty 0.5

## 2020-12-04 MED ORDER — FLEET ENEMA 7-19 GM/118ML RE ENEM
1.0000 | ENEMA | Freq: Once | RECTAL | Status: AC
  Administered 2020-12-04: 1 via RECTAL

## 2020-12-04 MED ORDER — LEVOTHYROXINE SODIUM 100 MCG/5ML IV SOLN
37.5000 ug | Freq: Every day | INTRAVENOUS | Status: DC
  Filled 2020-12-04: qty 5

## 2020-12-04 SURGICAL SUPPLY — 29 items
CANISTER SUCT 1200ML W/VALVE (MISCELLANEOUS) ×2 IMPLANT
CHLORAPREP W/TINT 26 (MISCELLANEOUS) ×2 IMPLANT
COVER WAND RF STERILE (DRAPES) ×2 IMPLANT
DRAPE LAPAROTOMY 100X77 ABD (DRAPES) ×2 IMPLANT
DRSG OPSITE POSTOP 4X10 (GAUZE/BANDAGES/DRESSINGS) ×2 IMPLANT
DRSG OPSITE POSTOP 4X8 (GAUZE/BANDAGES/DRESSINGS) ×2 IMPLANT
DRSG TEGADERM 4X10 (GAUZE/BANDAGES/DRESSINGS) ×2 IMPLANT
DRSG TELFA 3X8 NADH (GAUZE/BANDAGES/DRESSINGS) ×2 IMPLANT
ELECT REM PT RETURN 9FT ADLT (ELECTROSURGICAL) ×2
ELECTRODE REM PT RTRN 9FT ADLT (ELECTROSURGICAL) ×1 IMPLANT
GLOVE SURG ENC MOIS LTX SZ6.5 (GLOVE) ×2 IMPLANT
GLOVE SURG UNDER POLY LF SZ6.5 (GLOVE) ×2 IMPLANT
GOWN STRL REUS W/ TWL LRG LVL3 (GOWN DISPOSABLE) ×2 IMPLANT
GOWN STRL REUS W/TWL LRG LVL3 (GOWN DISPOSABLE) ×2
KIT TURNOVER KIT A (KITS) ×2 IMPLANT
LABEL OR SOLS (LABEL) ×2 IMPLANT
MANIFOLD NEPTUNE II (INSTRUMENTS) ×2 IMPLANT
NS IRRIG 1000ML POUR BTL (IV SOLUTION) ×2 IMPLANT
PACK BASIN MAJOR ARMC (MISCELLANEOUS) ×2 IMPLANT
PACK COLON CLEAN CLOSURE (MISCELLANEOUS) ×2 IMPLANT
SPONGE LAP 18X18 RF (DISPOSABLE) ×2 IMPLANT
SUT PDS AB 1 TP1 54 (SUTURE) ×4 IMPLANT
SUT SILK 2 0 (SUTURE) ×1
SUT SILK 2-0 18XBRD TIE 12 (SUTURE) ×1 IMPLANT
SUT SILK 3 0 (SUTURE) ×1
SUT SILK 3-0 18XBRD TIE 12 (SUTURE) ×1 IMPLANT
SUT VIC AB 3-0 SH 27 (SUTURE) ×2
SUT VIC AB 3-0 SH 27X BRD (SUTURE) ×2 IMPLANT
TRAY FOLEY MTR SLVR 16FR STAT (SET/KITS/TRAYS/PACK) ×2 IMPLANT

## 2020-12-04 NOTE — Progress Notes (Signed)
Physical Therapy Treatment Patient Details Name: Daniel Bray MRN: [id number] DOB: 01-14-1935 Today's Date: 12/04/2020    History of Present Illness 86 year old male with history of HTN, DM, hypothyroidism, CAD status post CABG, CKD lllb, history of renal cell, colon and pancreatic cancer status post nephrectomy, subtotal colectomy and multiple other abdominal surgeries, presented to hospital ED 12/02/2020 with epigastric pain and with syncopal episode while in the emergency room waiting room. Admitted for partial small bowel obstruction, syncope, elevated troponin. General surgery to do a Gastrografin challenge.    PT Comments    On arrival to room, patient standing to urinate with family assistance. Called nurse who came to room to assess position of NG tube as patient felt it moved while standing.  Patient with increased activity tolerance this session. Patient ambulated in room and 3 laps around nursing station without rest break. Patient has mild unsteadiness that is self corrected. Min guard provided for safety. Sp02 91% on room air and heart rate noted to be up to 119 bpm with activity.  Educated patient and family on progressing walking activity at home, walking for conditioning, energy conservation. Recommend to continue PT to maximize independence in preparation for discharge home. Patient has very supportive family who will assist at discharge as needed.     Follow Up Recommendations  Home health PT     Equipment Recommendations  None recommended by PT    Recommendations for Other Services       Precautions / Restrictions Precautions Precautions: Fall Restrictions Weight Bearing Restrictions: No    Mobility  Bed Mobility Overal bed mobility: Independent                  Transfers Overall transfer level: Needs assistance Equipment used: None Transfers: Sit to/from Stand Sit to Stand: Supervision         General transfer comment: supervision for safety  and as patient is mildly impulsive with activity  Ambulation/Gait Ambulation/Gait assistance: Min guard Gait Distance (Feet): 480 Feet Assistive device: None Gait Pattern/deviations: Step-through pattern Gait velocity: WFL   General Gait Details: Patient does have occasional unsteadiness with ambulation that is self corrected. Min guard and occasional verbal cues for safety. Sp02 91% after walking on room air and heart rate 119bpm, decreasing to 100bpm after walking. no significant shortness of breath noted   Stairs             Wheelchair Mobility    Modified Rankin (Stroke Patients Only)       Balance                                            Cognition Arousal/Alertness: Awake/alert Behavior During Therapy: WFL for tasks assessed/performed Overall Cognitive Status: Within Functional Limits for tasks assessed                                        Exercises      General Comments        Pertinent Vitals/Pain Pain Assessment: No/denies pain    Home Living                      Prior Function            PT Goals (current goals can now be  found in the care plan section) Acute Rehab PT Goals Patient Stated Goal: to get better and go home PT Goal Formulation: With patient Time For Goal Achievement: 12/17/20 Potential to Achieve Goals: Good Progress towards PT goals: Progressing toward goals    Frequency    Min 2X/week      PT Plan Current plan remains appropriate    Co-evaluation              AM-PAC PT "6 Clicks" Mobility   Outcome Measure  Help needed turning from your back to your side while in a flat bed without using bedrails?: None Help needed moving from lying on your back to sitting on the side of a flat bed without using bedrails?: None Help needed moving to and from a bed to a chair (including a wheelchair)?: A Little Help needed standing up from a chair using your arms (e.g.,  wheelchair or bedside chair)?: A Little Help needed to walk in hospital room?: A Little Help needed climbing 3-5 steps with a railing? : A Little 6 Click Score: 20    End of Session   Activity Tolerance: Patient tolerated treatment well Patient left: in bed;with call bell/phone within reach;with bed alarm set Nurse Communication: Mobility status PT Visit Diagnosis: Unsteadiness on feet (R26.81);Muscle weakness (generalized) (M62.81)     Time: 4270-6237 PT Time Calculation (min) (ACUTE ONLY): 21 min  Charges:  $Therapeutic Activity: 8-22 mins                     Minna Merritts, PT, MPT    Percell Locus 12/04/2020, 3:36 PM

## 2020-12-04 NOTE — TOC Initial Note (Signed)
Transition of Care [facility]) - Initial/Assessment Note    Patient Details  Name: Daniel Bray MRN: [id number] Date of Birth: April 29, 1935  Transition of Care [facility]) CM/SW Contact:    Kerin Salen, RN Phone Number: 12/04/2020, 3:27 PM  Clinical Narrative:   Spoke with patient, spouse and daughter in room. Patient states he lives at home with spouse, drives to medical appointments and to pick up meds at the [employer]. Denies use of assisted device able to do ADL's independently. Patient and family refuse HH PT, says he is independent with family close by. No TOC needs assessed at this time, will continue to track for discharge needs.                Expected Discharge Plan: Home/Self Care Barriers to Discharge: Continued Medical Work up   Patient Goals and CMS Choice Patient states their goals for this hospitalization and ongoing recovery are:: To return home with Spouse.   Choice offered to / list presented to : Spouse,Patient  Expected Discharge Plan and Services Expected Discharge Plan: Home/Self Care In-house Referral: Clinical Social Work   Post Acute Care Choice: [place] arrangements for the past 2 months: Single Family Home                 DME Arranged: N/A DME Agency: NA       HH Arranged: Patient Refused [place] Agency: NA        Prior Living Arrangements/Services Living arrangements for the past 2 months: Single Family Home Lives with:: Spouse Patient language and need for interpreter reviewed:: Yes Do you feel safe going back to the place where you live?: Yes      Need for Family Participation in Patient Care: Yes (Comment) Care giver support system in place?: Yes (comment)   Criminal Activity/Legal Involvement Pertinent to Current Situation/Hospitalization: No - Comment as needed  Activities of Daily Living   ADL Screening (condition at time of admission) Patient's cognitive ability adequate to safely complete daily  activities?: Yes Is the patient deaf or have difficulty hearing?: No Does the patient have difficulty seeing, even when wearing glasses/contacts?: No Does the patient have difficulty concentrating, remembering, or making decisions?: No Patient able to express need for assistance with ADLs?: No Does the patient have difficulty dressing or bathing?: No Independently performs ADLs?: Yes (appropriate for developmental age) Does the patient have difficulty walking or climbing stairs?: No Weakness of Legs: None Weakness of Arms/Hands: None  Permission Sought/Granted                  Emotional Assessment Appearance:: Appears stated age Attitude/Demeanor/Rapport: Guarded Affect (typically observed): Guarded Orientation: : Oriented to Self,Oriented to Place,Oriented to  Time,Oriented to Situation Alcohol / Substance Use: Not Applicable Psych Involvement: No (comment)  Admission diagnosis:  Small bowel obstruction ([place]) [K56.609] SBO (small bowel obstruction) ([place]) [K56.609] Partial small bowel obstruction ([place]) [K56.600] Patient Active Problem List   Diagnosis Date Noted  . 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  . Diabetic polyneuropathy associated with type 2 diabetes mellitus ([place])   . Ileus ([place]) 05/29/2020  . Acute renal failure superimposed on stage 3a chronic kidney disease ([place]) 05/28/2020  . Nausea vomiting and diarrhea 05/28/2020  . Abdominal pain 05/28/2020  . Hyperkalemia 05/28/2020  . Hypercalcemia 05/28/2020  . Abnormal LFTs 05/28/2020  . Elevated troponin 01/24/2020  . Leukocytosis 01/24/2020  . HLD (hyperlipidemia) 01/24/2020  . GERD (gastroesophageal reflux disease) 01/24/2020  . Anxiety 01/24/2020  .  CAD (coronary artery disease) 01/24/2020  . Tobacco abuse 01/24/2020  . Stage 3b chronic kidney disease ([place]) 01/24/2020  . Bradycardia 01/24/2020  . Laceration of forehead   . Syncope 10/12/2019  . Type 2 diabetes mellitus with hyperlipidemia ([place]) 10/12/2019  . Accelerated  hypertension 10/12/2019  . History of renal cell carcinoma s/p nephrectomy 10/12/2019  . SBO (small bowel obstruction) ([place]) 10/12/2019  . Hypothyroidism 10/12/2019   PCP:  Elza Rafter, MD Pharmacy:   [facility] [address] [place] [address] Phone: [phone] Fax: [place], [place] [address] [address] Phone: [phone] Fax: [place], [place] [address] Phone: [phone] Fax: [phone]     Social Determinants of Health (SDOH) Interventions    Readmission Risk Interventions No flowsheet data found.

## 2020-12-04 NOTE — Progress Notes (Addendum)
Patient stood up at side of bed unassisted to urinate. RN called to room by PT per patient request. Patient pulled NG tube while standing to urinate. NG tube was reinforced with tape. MD notified and order placed for adb xray for tube placement verification. Results pending. NG tube is on low intermittent suction with fluid of same appearance draining. Patient not in distress and all needs met at this time. Will review results from xray when available.   Results from Xray confirmed correct placement of NG tube.

## 2020-12-04 NOTE — Progress Notes (Signed)
Patient ID: Daniel Bray, male   DOB: 1934-09-07, 86 y.o.   MRN: [id number] [facility] PROGRESS NOTE  KJ IMBERT WEX:[id number] DOB: November 22, 1934 DOA: 12/02/2020 PCP: Elza Rafter, MD  HPI/Subjective: Patient was feeling a lot better this morning than yesterday.  No abdominal pain currently.  Still not passing gas.  Gastrografin contrast did move through into the colon.  Surgery saw the patient today and were happy about the progress.  Objective: Vitals:   12/04/20 0815 12/04/20 1154  BP:  (!) 145/74  Pulse:  89  Resp:  16  Temp: 98.1 F (36.7 C) 98.3 F (36.8 C)  SpO2:  94%    Intake/Output Summary (Last 24 hours) at 12/04/2020 1504 Last data filed at 12/04/2020 1131 Gross per 24 hour  Intake 2347.62 ml  Output 2300 ml  Net 47.62 ml   Filed Weights   12/01/20 2357 12/04/20 0515  Weight: 65.3 kg 62.8 kg    ROS: Review of Systems  Respiratory: Negative for shortness of breath.   Cardiovascular: Negative for chest pain.  Gastrointestinal: Negative for abdominal pain, nausea and vomiting.   Exam: Physical Exam HENT:     Head: Normocephalic.     Mouth/Throat:     Pharynx: No oropharyngeal exudate.  Eyes:     General: Lids are normal.     Conjunctiva/sclera: Conjunctivae normal.     Pupils: Pupils are equal, round, and reactive to light.  Cardiovascular:     Rate and Rhythm: Normal rate and regular rhythm.     Heart sounds: Normal heart sounds, S1 normal and S2 normal.  Pulmonary:     Breath sounds: Normal breath sounds. No decreased breath sounds, wheezing, rhonchi or rales.  Abdominal:     General: Bowel sounds are decreased.     Palpations: Abdomen is soft.     Tenderness: There is no abdominal tenderness.  Musculoskeletal:     Right ankle: No swelling.     Left ankle: No swelling.  Skin:    General: Skin is warm.     Findings: No rash.  Neurological:     Mental Status: He is alert and oriented to person, place, and time.        Data Reviewed: Basic Metabolic Panel: Recent Labs  Lab 12/02/20 0022 12/03/20 0759  NA 136 141  K 4.5 4.5  CL 102 103  CO2 26 24  GLUCOSE 269* 150*  BUN 34* 36*  CREATININE 1.63* 1.54*  CALCIUM 10.5* 10.6*   Liver Function Tests: Recent Labs  Lab 12/02/20 0022  AST 40  ALT 36  ALKPHOS 51  BILITOT 0.9  PROT 7.4  ALBUMIN 4.1   Recent Labs  Lab 12/02/20 0022  LIPASE 20   CBC: Recent Labs  Lab 12/02/20 0022  WBC 16.4*  NEUTROABS 9.7*  HGB 15.8  HCT 48.6  MCV 102.7*  PLT 233   BNP (last 3 results) Recent Labs    01/24/20 1116  BNP 204.6*     CBG: Recent Labs  Lab 12/03/20 2036 12/03/20 2349 12/04/20 0516 12/04/20 0825 12/04/20 1153  GLUCAP 136* 116* 174* 156* 115*    Recent Results (from the past 240 hour(s))  Resp Panel by RT-PCR (Flu A&B, Covid) Nasopharyngeal Swab     Status: None   Collection Time: 12/02/20  4:45 AM   Specimen: Nasopharyngeal Swab; Nasopharyngeal(NP) swabs in vial transport medium  Result Value Ref Range Status   SARS Coronavirus 2 by RT PCR NEGATIVE NEGATIVE Final  Comment: (NOTE) SARS-CoV-2 target nucleic acids are NOT DETECTED.  The SARS-CoV-2 RNA is generally detectable in upper respiratory specimens during the acute phase of infection. The lowest concentration of SARS-CoV-2 viral copies this assay can detect is 138 copies/mL. A negative result does not preclude SARS-Cov-2 infection and should not be used as the sole basis for treatment or other patient management decisions. A negative result may occur with  improper specimen collection/handling, submission of specimen other than nasopharyngeal swab, presence of viral mutation(s) within the areas targeted by this assay, and inadequate number of viral copies(<138 copies/mL). A negative result must be combined with clinical observations, patient history, and epidemiological information. The expected result is Negative.  Fact Sheet for Patients:   EntrepreneurPulse.com.au  Fact Sheet for Healthcare Providers:  IncredibleEmployment.be  This test is no t yet approved or cleared by the [place] FDA and  has been authorized for detection and/or diagnosis of SARS-CoV-2 by FDA under an Emergency Use Authorization (EUA). This EUA will remain  in effect (meaning this test can be used) for the duration of the COVID-19 declaration under Section 564(b)(1) of the Act, 21 U.S.C.section 360bbb-3(b)(1), unless the authorization is terminated  or revoked sooner.       Influenza A by PCR NEGATIVE NEGATIVE Final   Influenza B by PCR NEGATIVE NEGATIVE Final    Comment: (NOTE) The Xpert Xpress SARS-CoV-2/FLU/RSV plus assay is intended as an aid in the diagnosis of influenza from Nasopharyngeal swab specimens and should not be used as a sole basis for treatment. Nasal washings and aspirates are unacceptable for Xpert Xpress SARS-CoV-2/FLU/RSV testing.  Fact Sheet for Patients: EntrepreneurPulse.com.au  Fact Sheet for Healthcare Providers: IncredibleEmployment.be  This test is not yet approved or cleared by the [place] FDA and has been authorized for detection and/or diagnosis of SARS-CoV-2 by FDA under an Emergency Use Authorization (EUA). This EUA will remain in effect (meaning this test can be used) for the duration of the COVID-19 declaration under Section 564(b)(1) of the Act, 21 U.S.C. section 360bbb-3(b)(1), unless the authorization is terminated or revoked.  Performed at [facility], [address] [place], [place] [address]      Studies: DG Abd 2 Views  Result Date: 12/04/2020 CLINICAL DATA:  Abdominal pain EXAM: ABDOMEN - 2 VIEW COMPARISON:  Dec 04, 2020 study obtained earlier in the day FINDINGS: Supine and upright images obtained. Nasogastric tube tip and side port remain in proximal stomach. Mild bowel dilatation persists without  appreciable air-fluid levels. No free air. Multiple clips in abdomen. No change in lung bases. IMPRESSION: No change in nasogastric tube positioning. Suspect a degree of ileus or enteritis. Appearance similar to earlier in the day. No free air demonstrable. Electronically Signed   By: Lowella Grip III M.D.   On: 12/04/2020 09:30   DG Abd 2 Views  Result Date: 12/03/2020 CLINICAL DATA:  Small-bowel obstruction EXAM: ABDOMEN - 2 VIEW COMPARISON:  12/02/2020 FINDINGS: Supine and upright frontal views of the abdomen and pelvis are obtained. Enteric catheter unchanged within the gastric body. Retained oral contrast within the gastric lumen, with slow diffusion of contrast into multiple dilated loops of small bowel within the left upper quadrant. Numerous gas fluid levels consistent with high-grade small bowel obstruction. There is no evidence of contrast progression into the remaining rectosigmoid colon, in this patient with subtotal colectomy. No masses or abnormal calcifications. IMPRESSION: 1. Slow diffusion of oral contrast throughout dilated loops of small bowel within the upper abdomen, consistent with  high-grade obstruction. No evidence of contrast progression into the remaining rectosigmoid colon. Electronically Signed   By: Randa Ngo M.D.   On: 12/03/2020 15:38   DG Abd Portable 1V  Result Date: 12/04/2020 CLINICAL DATA:  Nasogastric tube placement EXAM: PORTABLE ABDOMEN - 1 VIEW COMPARISON:  Dec 03, 2020 FINDINGS: Nasogastric tube tip and side port are in the proximal stomach. There are loops of mildly dilated bowel without air-fluid levels. Contrast is seen in the distal colon and rectum. No free air evident. There are multiple surgical clips in the abdomen. There is mild left base atelectasis. IMPRESSION: Nasogastric tube tip and side port in proximal stomach. Loops of mildly dilated small bowel, likely representing a degree of enteritis or ileus. Bowel obstruction not felt to be present.  Contrast present in distal large bowel. No free air. Electronically Signed   By: Lowella Grip III M.D.   On: 12/04/2020 07:55   DG Abd Portable 1V-Small Bowel Obstruction Protocol-initial, 8 hr delay  Result Date: 12/02/2020 CLINICAL DATA:  Small-bowel obstruction EXAM: PORTABLE ABDOMEN - 1 VIEW COMPARISON:  12/02/2020 FINDINGS: Two supine frontal views of the abdomen and pelvis demonstrate enteric catheter within the gastric fundus. Oral contrast is seen within the gastric fundus. Gaseous distention of the small bowel within the left upper quadrant unchanged since preceding CT. Excreted contrast within the urinary bladder. No masses or abnormal calcifications. Extensive postsurgical changes with numerous clips throughout the retroperitoneum. IMPRESSION: 1. Continued dilated small bowel left upper quadrant consistent with small-bowel obstruction. 2. Enteric catheter within the gastric lumen, with contrast filling the gastric fundus. Electronically Signed   By: Randa Ngo M.D.   On: 12/02/2020 19:06    Scheduled Meds: . cloNIDine  0.2 mg Transdermal Weekly  . enoxaparin (LOVENOX) injection  40 mg Subcutaneous Q24H  . hydrALAZINE  25 mg Oral TID  . insulin aspart  0-9 Units Subcutaneous Q4H  . [START ON 12/05/2020] levothyroxine  37.5 mcg Intravenous Daily  . lisinopril  20 mg Oral Daily   Continuous Infusions: . lactated ringers 100 mL/hr at 12/04/20 0808    Assessment/Plan:  1. Small bowel obstruction.  Continue NG tube.  Just got notified that the NG tube got pulled a little bit we will get an x-ray to confirm placement.  Continue IV fluids.  General surgery evaluation appreciated.  Since patient is doing a little bit better today they will continue to watch conservatively. 2. Accelerated hypertension.  Blood pressure better after starting oral medications hydralazine and lisinopril.  Continue clonidine patch. 3. Chronic kidney disease stage IIIb 4. Type 2 diabetes mellitus with  hyperlipidemia.  On sliding scale.  Holding oral medications and cholesterol medications with NG tube. 5. Hypothyroidism unspecified on IV levothyroxine 6. Diabetic neuropathy.  Holding Neurontin 7. History of pancreatic cancer on pancreatic enzymes chronically.  Holding pancreatic enzymes while n.p.o. 8. Skin tear on left back.  Cover while open.        Code Status:     Code Status Orders  (From admission, onward)         Start     Ordered   12/02/20 0400  Full code  Continuous        12/02/20 0404        Code Status History    Date Active Date Inactive Code Status Order ID Comments User Context   05/28/2020 1125 05/30/2020 1934 Full Code [id number]  Ivor Costa, MD ED   01/24/2020 2009 01/25/2020 1855 Full Code [id number]  Ivor Costa, MD ED   10/12/2019 1248 10/13/2019 2028 Full Code [id number]  Collier Bullock, MD ED   Advance Care Planning Activity     Family Communication: Spoke with family at the bedside Disposition Plan: Status is: Inpatient  Dispo: The patient is from: Home              Anticipated d/c is to: Home              Patient currently has NG tube and treated for small bowel obstruction   Difficult to place patient.  No.  Time spent: 27 minutes  [place]

## 2020-12-04 NOTE — Progress Notes (Signed)
Patient ID: Daniel Bray, male   DOB: Aug 05, 1935, 86 y.o.   MRN: [id number]     [place] Hospital Day(s): 2.   Post op day(s): Day of Surgery.   Interval History: Patient seen and examined, no acute events or new complaints overnight.  Patient is morning reports feeling good.  Reports feeling much much better compared to yesterday.  Yesterday night I was able to decompress his stomach draining later in just 30 minutes aspirating the nasogastric tube.  I think that this significantly improve his pain and lead he is small bowel decompressed and open.  Still denies passing gas.  Vital signs in last 24 hours: [min-max] current  Temp:  [97.6 F (36.4 C)-98.3 F (36.8 C)] 98.3 F (36.8 C) (05/02 1154) Pulse Rate:  [89-105] 89 (05/02 1154) Resp:  [16-17] 16 (05/02 1154) BP: (145-182)/(74-110) 145/74 (05/02 1154) SpO2:  [93 %-97 %] 94 % (05/02 1154) Weight:  [62.8 kg] 62.8 kg (05/02 0515)     Height: 5\' 10"  (177.8 cm) Weight: 62.8 kg BMI (Calculated): 19.87   Physical Exam:  Constitutional: alert, cooperative and no distress  Respiratory: breathing non-labored at rest  Cardiovascular: regular rate and sinus rhythm  Gastrointestinal: soft, non-tender, and non-distended  Labs:  CBC Latest Ref Rng & Units 12/02/2020 05/30/2020 05/29/2020  WBC 4.0 - 10.5 K/uL 16.4(H) 27.1(H) 30.2(H)  Hemoglobin 13.0 - 17.0 g/dL 15.8 13.0 14.1  Hematocrit 39.0 - 52.0 % 48.6 39.8 42.2  Platelets 150 - 400 K/uL 233 159 171   CMP Latest Ref Rng & Units 12/03/2020 12/02/2020 05/30/2020  Glucose 70 - 99 mg/dL 150(H) 269(H) 137(H)  BUN 8 - 23 mg/dL 36(H) 34(H) 34(H)  Creatinine 0.61 - 1.24 mg/dL 1.54(H) 1.63(H) 1.61(H)  Sodium 135 - 145 mmol/L 141 136 133(L)  Potassium 3.5 - 5.1 mmol/L 4.5 4.5 4.7  Chloride 98 - 111 mmol/L 103 102 107  CO2 22 - 32 mmol/L 24 26 18(L)  Calcium 8.9 - 10.3 mg/dL 10.6(H) 10.5(H) 9.8  Total Protein 6.5 - 8.1 g/dL - 7.4 6.0(L)  Total Bilirubin 0.3 - 1.2 mg/dL -  0.9 1.0  Alkaline Phos 38 - 126 U/L - 51 51  AST 15 - 41 U/L - 40 20  ALT 0 - 44 U/L - 36 31    Imaging studies: I personally evaluated the abdominal x-rays done this morning.  This shows significant improvement of small bowel dilation.  There is no air-fluid levels.  There is contrast in the large intestine.  EXAM: PORTABLE ABDOMEN - 1 VIEW  COMPARISON:  Dec 03, 2020  FINDINGS: Nasogastric tube tip and side port are in the proximal stomach. There are loops of mildly dilated bowel without air-fluid levels. Contrast is seen in the distal colon and rectum. No free air evident. There are multiple surgical clips in the abdomen. There is mild left base atelectasis.  IMPRESSION: Nasogastric tube tip and side port in proximal stomach. Loops of mildly dilated small bowel, likely representing a degree of enteritis or ileus. Bowel obstruction not felt to be present. Contrast present in distal large bowel. No free air.   Electronically Signed   By: Lowella Grip III M.D.   On: 12/04/2020 07:55  EXAM: ABDOMEN - 2 VIEW  COMPARISON:  Dec 04, 2020 study obtained earlier in the day  FINDINGS: Supine and upright images obtained. Nasogastric tube tip and side port remain in proximal stomach. Mild bowel dilatation persists without appreciable air-fluid levels. No free air. Multiple  clips in abdomen. No change in lung bases.  IMPRESSION: No change in nasogastric tube positioning. Suspect a degree of ileus or enteritis. Appearance similar to earlier in the day. No free air demonstrable.   Electronically Signed   By: Lowella Grip III M.D.   On: 12/04/2020 09:30   Assessment/Plan:  86 y.o.malewith small bowel obstruction, complicated by pertinent comorbidities includingdiabetes, chronic kidney disease, pancreatic insufficiency.  Today there was a significant improvement clinically.  Even though patient is not passing gas he still much better.  He feels  comfortable.  No burping.  No hiccups.  X-ray done this morning showed significant improvement as well.  Even though the contrast did not reach the colon in 24 hours the fact that the patient has a significant improvement I think that it reports to continue conservative management.  I discussed with the patient and the family that we will try to clean his chronic constipation of the rectum to see if the also helps to move gas.  Arnold Long, MD

## 2020-12-05 DIAGNOSIS — R066 Hiccough: Secondary | ICD-10-CM

## 2020-12-05 LAB — CBC
HCT: 41.5 % (ref 39.0–52.0)
Hemoglobin: 13.3 g/dL (ref 13.0–17.0)
MCH: 33.5 pg (ref 26.0–34.0)
MCHC: 32 g/dL (ref 30.0–36.0)
MCV: 104.5 fL — ABNORMAL HIGH (ref 80.0–100.0)
Platelets: 199 10*3/uL (ref 150–400)
RBC: 3.97 MIL/uL — ABNORMAL LOW (ref 4.22–5.81)
RDW: 12.8 % (ref 11.5–15.5)
WBC: 21 10*3/uL — ABNORMAL HIGH (ref 4.0–10.5)
nRBC: 0 % (ref 0.0–0.2)

## 2020-12-05 LAB — GLUCOSE, CAPILLARY
Glucose-Capillary: 101 mg/dL — ABNORMAL HIGH (ref 70–99)
Glucose-Capillary: 109 mg/dL — ABNORMAL HIGH (ref 70–99)
Glucose-Capillary: 115 mg/dL — ABNORMAL HIGH (ref 70–99)
Glucose-Capillary: 116 mg/dL — ABNORMAL HIGH (ref 70–99)
Glucose-Capillary: 122 mg/dL — ABNORMAL HIGH (ref 70–99)
Glucose-Capillary: 147 mg/dL — ABNORMAL HIGH (ref 70–99)
Glucose-Capillary: 173 mg/dL — ABNORMAL HIGH (ref 70–99)

## 2020-12-05 LAB — BASIC METABOLIC PANEL
Anion gap: 13 (ref 5–15)
BUN: 42 mg/dL — ABNORMAL HIGH (ref 8–23)
CO2: 28 mmol/L (ref 22–32)
Calcium: 9.6 mg/dL (ref 8.9–10.3)
Chloride: 100 mmol/L (ref 98–111)
Creatinine, Ser: 1.54 mg/dL — ABNORMAL HIGH (ref 0.61–1.24)
GFR, Estimated: 44 mL/min — ABNORMAL LOW (ref >60)
Glucose, Bld: 108 mg/dL — ABNORMAL HIGH (ref 70–99)
Potassium: 3.7 mmol/L (ref 3.5–5.1)
Sodium: 141 mmol/L (ref 135–145)

## 2020-12-05 MED ORDER — LEVOTHYROXINE SODIUM 50 MCG PO TABS
75.0000 ug | ORAL_TABLET | Freq: Every day | ORAL | Status: DC
  Administered 2020-12-05 – 2020-12-06 (×2): 75 ug via ORAL
  Filled 2020-12-05 (×2): qty 1

## 2020-12-05 MED ORDER — SODIUM CHLORIDE 0.9 % IV SOLN
12.5000 mg | Freq: Once | INTRAVENOUS | Status: AC
  Administered 2020-12-05: 12.5 mg via INTRAVENOUS
  Filled 2020-12-05: qty 0.5

## 2020-12-05 MED ORDER — SODIUM CHLORIDE 0.9 % IV SOLN
25.0000 mg | Freq: Three times a day (TID) | INTRAVENOUS | Status: DC | PRN
  Administered 2020-12-05: 25 mg via INTRAVENOUS
  Filled 2020-12-05: qty 1

## 2020-12-05 NOTE — Progress Notes (Signed)
Patient ID: Daniel Bray, male   DOB: 1935-02-06, 86 y.o.   MRN: [id number] [facility] PROGRESS NOTE  ELISEO WITHERS VPX:[id number] DOB: 11-Feb-1935 DOA: 12/02/2020 PCP: Elza Rafter, MD  HPI/Subjective: Patient complaining of hiccups.  Not having any abdominal pain this morning.  Passing gas.  Had a bowel movement yesterday after enema.  Admitted with small bowel obstruction.  Objective: Vitals:   12/05/20 1126 12/05/20 1431  BP: (!) 176/84 (!) 151/83  Pulse: 92 100  Resp: 18 19  Temp:  98.4 F (36.9 C)  SpO2: 94% 98%    Intake/Output Summary (Last 24 hours) at 12/05/2020 1627 Last data filed at 12/05/2020 1532 Gross per 24 hour  Intake 3522.65 ml  Output 1125 ml  Net 2397.65 ml   Filed Weights   12/01/20 2357 12/04/20 0515 12/05/20 0500  Weight: 65.3 kg 62.8 kg 62.6 kg    ROS: Review of Systems  Respiratory: Negative for shortness of breath.   Cardiovascular: Negative for chest pain.  Gastrointestinal: Negative for abdominal pain, nausea and vomiting.   Exam: Physical Exam HENT:     Head: Normocephalic.     Mouth/Throat:     Pharynx: No oropharyngeal exudate.  Eyes:     General: Lids are normal.     Conjunctiva/sclera: Conjunctivae normal.  Cardiovascular:     Rate and Rhythm: Normal rate and regular rhythm.     Heart sounds: Normal heart sounds, S1 normal and S2 normal.  Pulmonary:     Breath sounds: No decreased breath sounds, wheezing, rhonchi or rales.  Abdominal:     General: Bowel sounds are decreased.     Palpations: Abdomen is soft.     Tenderness: There is no abdominal tenderness.  Musculoskeletal:     Right lower leg: No swelling.     Left lower leg: No swelling.  Skin:    General: Skin is warm.     Findings: No rash.  Neurological:     Mental Status: He is alert and oriented to person, place, and time.       Data Reviewed: Basic Metabolic Panel: Recent Labs  Lab 12/02/20 0022 12/03/20 0759 12/05/20 0340  NA  136 141 141  K 4.5 4.5 3.7  CL 102 103 100  CO2 26 24 28   GLUCOSE 269* 150* 108*  BUN 34* 36* 42*  CREATININE 1.63* 1.54* 1.54*  CALCIUM 10.5* 10.6* 9.6   Liver Function Tests: Recent Labs  Lab 12/02/20 0022  AST 40  ALT 36  ALKPHOS 51  BILITOT 0.9  PROT 7.4  ALBUMIN 4.1   Recent Labs  Lab 12/02/20 0022  LIPASE 20   CBC: Recent Labs  Lab 12/02/20 0022 12/05/20 0340  WBC 16.4* 21.0*  NEUTROABS 9.7*  --   HGB 15.8 13.3  HCT 48.6 41.5  MCV 102.7* 104.5*  PLT 233 199   BNP (last 3 results) Recent Labs    01/24/20 1116  BNP 204.6*    CBG: Recent Labs  Lab 12/04/20 1949 12/05/20 0009 12/05/20 0442 12/05/20 0740 12/05/20 1126  GLUCAP 125* 101* 116* 109* 115*    Recent Results (from the past 240 hour(s))  Resp Panel by RT-PCR (Flu A&B, Covid) Nasopharyngeal Swab     Status: None   Collection Time: 12/02/20  4:45 AM   Specimen: Nasopharyngeal Swab; Nasopharyngeal(NP) swabs in vial transport medium  Result Value Ref Range Status   SARS Coronavirus 2 by RT PCR NEGATIVE NEGATIVE Final    Comment: (NOTE) SARS-CoV-2 target nucleic  acids are NOT DETECTED.  The SARS-CoV-2 RNA is generally detectable in upper respiratory specimens during the acute phase of infection. The lowest concentration of SARS-CoV-2 viral copies this assay can detect is 138 copies/mL. A negative result does not preclude SARS-Cov-2 infection and should not be used as the sole basis for treatment or other patient management decisions. A negative result may occur with  improper specimen collection/handling, submission of specimen other than nasopharyngeal swab, presence of viral mutation(s) within the areas targeted by this assay, and inadequate number of viral copies(<138 copies/mL). A negative result must be combined with clinical observations, patient history, and epidemiological information. The expected result is Negative.  Fact Sheet for Patients:   EntrepreneurPulse.com.au  Fact Sheet for Healthcare Providers:  IncredibleEmployment.be  This test is no t yet approved or cleared by the [place] FDA and  has been authorized for detection and/or diagnosis of SARS-CoV-2 by FDA under an Emergency Use Authorization (EUA). This EUA will remain  in effect (meaning this test can be used) for the duration of the COVID-19 declaration under Section 564(b)(1) of the Act, 21 U.S.C.section 360bbb-3(b)(1), unless the authorization is terminated  or revoked sooner.       Influenza A by PCR NEGATIVE NEGATIVE Final   Influenza B by PCR NEGATIVE NEGATIVE Final    Comment: (NOTE) The Xpert Xpress SARS-CoV-2/FLU/RSV plus assay is intended as an aid in the diagnosis of influenza from Nasopharyngeal swab specimens and should not be used as a sole basis for treatment. Nasal washings and aspirates are unacceptable for Xpert Xpress SARS-CoV-2/FLU/RSV testing.  Fact Sheet for Patients: EntrepreneurPulse.com.au  Fact Sheet for Healthcare Providers: IncredibleEmployment.be  This test is not yet approved or cleared by the [place] FDA and has been authorized for detection and/or diagnosis of SARS-CoV-2 by FDA under an Emergency Use Authorization (EUA). This EUA will remain in effect (meaning this test can be used) for the duration of the COVID-19 declaration under Section 564(b)(1) of the Act, 21 U.S.C. section 360bbb-3(b)(1), unless the authorization is terminated or revoked.  Performed at [facility], [address] [place], [place] [address]      Studies: DG Abd 1 View  Result Date: 12/04/2020 CLINICAL DATA:  Enteric catheter placement EXAM: ABDOMEN - 1 VIEW COMPARISON:  12/04/2020 FINDINGS: Frontal view of the abdomen and pelvis excludes the lower pelvis and left flank by collimation. Enteric catheter tip and side port project over the gastric body.  Gaseous distention of the small bowel is again noted most pronounced in the upper abdomen measuring up to 4.1 cm. Oral contrast seen within the distal colon as before. IMPRESSION: 1. Enteric catheter overlying gastric fundus. 2. Stable nonspecific gaseous distention of the bowel. Electronically Signed   By: Randa Ngo M.D.   On: 12/04/2020 17:26   DG Abd 2 Views  Result Date: 12/04/2020 CLINICAL DATA:  Abdominal pain EXAM: ABDOMEN - 2 VIEW COMPARISON:  Dec 04, 2020 study obtained earlier in the day FINDINGS: Supine and upright images obtained. Nasogastric tube tip and side port remain in proximal stomach. Mild bowel dilatation persists without appreciable air-fluid levels. No free air. Multiple clips in abdomen. No change in lung bases. IMPRESSION: No change in nasogastric tube positioning. Suspect a degree of ileus or enteritis. Appearance similar to earlier in the day. No free air demonstrable. Electronically Signed   By: Lowella Grip III M.D.   On: 12/04/2020 09:30   DG Abd Portable 1V  Result Date: 12/04/2020 CLINICAL DATA:  Nasogastric  tube placement EXAM: PORTABLE ABDOMEN - 1 VIEW COMPARISON:  Dec 03, 2020 FINDINGS: Nasogastric tube tip and side port are in the proximal stomach. There are loops of mildly dilated bowel without air-fluid levels. Contrast is seen in the distal colon and rectum. No free air evident. There are multiple surgical clips in the abdomen. There is mild left base atelectasis. IMPRESSION: Nasogastric tube tip and side port in proximal stomach. Loops of mildly dilated small bowel, likely representing a degree of enteritis or ileus. Bowel obstruction not felt to be present. Contrast present in distal large bowel. No free air. Electronically Signed   By: Lowella Grip III M.D.   On: 12/04/2020 07:55    Scheduled Meds: . cloNIDine  0.2 mg Transdermal Weekly  . enoxaparin (LOVENOX) injection  40 mg Subcutaneous Q24H  . hydrALAZINE  25 mg Oral TID  . insulin aspart  0-9  Units Subcutaneous Q4H  . levothyroxine  75 mcg Oral Q0600  . lisinopril  20 mg Oral Daily   Continuous Infusions: . chlorproMAZINE (THORAZINE) IV Stopped (12/05/20 1519)  . lactated ringers Stopped (12/05/20 1518)  . promethazine (PHENERGAN) injection (IM or IVPB) Stopped (12/04/20 2243)   Brief history.  Admitted 12/02/2020 with epigastric pain and found to have small bowel obstruction.  Past medical history of hypertension, diabetes, hypothyroidism, CAD, chronic kidney disease stage IIIb history of renal cell carcinoma, colon cancer, pancreatic cancer.  General surgery following for small bowel obstruction and has decided to do conservative management at this point.  Patient on IV fluids.  Assessment/Plan:  1. Small bowel obstruction.  NG tube fell out today.  General surgery is okay with leaving it out.  Patient on IV fluids.  Had a bowel movement yesterday with enema.  Passing gas today.  Multiple abdominal surgeries in the past. 2. Hiccups.  As needed IV Thorazine. 3. Accelerated hypertension on presentation.  On clonidine patch.  I did start lisinopril and hydralazine his usual medications at home. 4. Chronic kidney disease stage IIIb. creatinine 1.54 today.  History of kidney cancer 5. Type 2 diabetes mellitus with hyperlipidemia.  Holding oral medications while n.p.o. 6. Hypothyroidism unspecified.  Change IV levothyroxine over to oral levothyroxine 7. Diabetic neuropathy.  Restart Neurontin 8. History of pancreatic cancer.  Holding pancreatic enzymes NPO. 9. Skin tear on left back. 10. Elevated white blood cell count.  Looking back this has been going on for a while.  May refer to hematology as outpatient for further evaluation.        Code Status:     Code Status Orders  (From admission, onward)         Start     Ordered   12/02/20 0400  Full code  Continuous        12/02/20 0404        Code Status History    Date Active Date Inactive Code Status Order ID Comments  User Context   05/28/2020 1125 05/30/2020 1934 Full Code [id number]  Ivor Costa, MD ED   01/24/2020 2009 01/25/2020 1855 Full Code [id number]  Ivor Costa, MD ED   10/12/2019 1248 10/13/2019 2028 Full Code [id number]  Collier Bullock, MD ED   Advance Care Planning Activity     Family Communication: Family at bedside Disposition Plan: Status is: Inpatient  Dispo: The patient is from: Home              Anticipated d/c is to: Home  Patient currently being watched conservatively for small bowel obstruction   Difficult to place patient.  No.  Consultants:  General surgery  Time spent: 28 minutes  [place]

## 2020-12-05 NOTE — Progress Notes (Signed)
PT Cancellation Note  Patient Details Name: AYDDEN CUMPIAN MRN: [id number] DOB: 1934/11/30   Cancelled Treatment:    Reason Eval/Treat Not Completed: Patient declined, no reason specified. Patient reports he just returned from walking with NT. (Patient observed ambulating in hallway)  He reports no concerns with mobility at this time. Will sign off.    Raja Liska 12/05/2020, 11:18 AM

## 2020-12-05 NOTE — Progress Notes (Addendum)
Patient called this RN to room to inform me his NG tube had fallen out. Tape still secured to nose at my time of arrival with NG tube lying in bed next to patient. Patient not in distress, no complaints of nausea. This RN update MD Ferrel Logan and [employer] per secure chat and NG tube removal. Awaiting orders/advise at this time for plan to proceed.    MD Ferrel Logan updated this RN to keep NG tube out at this time.

## 2020-12-05 NOTE — Progress Notes (Addendum)
Patient ambulated in room to sink to shave, back and forth from bed to chair multiple times today without distress. Ambulated twice around nursing station multiple rounds. Patient resting in bed at this time without distress. Family at bedside. MD Ferrel Logan notified via secure chat that patient would like information about plan of care going forward and diet advancement.   Clear liquid diet per MD at this time. Assess tolerance.

## 2020-12-05 NOTE — Care Management Important Message (Signed)
Important Message  Patient Details  Name: Daniel Bray MRN: [id number] Date of Birth: [birth year]   Medicare Important Message Given:  Yes     Dannette Barbara 12/05/2020, 11:36 AM

## 2020-12-05 NOTE — Progress Notes (Signed)
Patient ID: Daniel Bray, male   DOB: May 30, 1935, 86 y.o.   MRN: [id number]     [facility] Day(s): 3.   Post op day(s): 1 Day Post-Op.   Interval History: Patient seen and examined, no acute events or new complaints overnight. Patient reports feeling good. Reports that enema worked and he is passing gas and having bowel movement. Denies abdominal pain or nausea.  Vital signs in last 24 hours: [min-max] current  Temp:  [97.7 F (36.5 C)-98.6 F (37 C)] 97.7 F (36.5 C) (05/03 0320) Pulse Rate:  [89-100] 91 (05/03 0740) Resp:  [16-20] 19 (05/03 0740) BP: (145-174)/(74-96) 174/96 (05/03 0740) SpO2:  [92 %-96 %] 96 % (05/03 0740) Weight:  [62.6 kg] 62.6 kg (05/03 0500)     Height: 5\' 10"  (177.8 cm) Weight: 62.6 kg BMI (Calculated): 19.79   Physical Exam:  Constitutional: alert, cooperative and no distress  Respiratory: breathing non-labored at rest  Cardiovascular: regular rate and sinus rhythm  Gastrointestinal: soft, non-tender, and non-distended  Labs:  CBC Latest Ref Rng & Units 12/05/2020 12/02/2020 05/30/2020  WBC 4.0 - 10.5 K/uL 21.0(H) 16.4(H) 27.1(H)  Hemoglobin 13.0 - 17.0 g/dL 13.3 15.8 13.0  Hematocrit 39.0 - 52.0 % 41.5 48.6 39.8  Platelets 150 - 400 K/uL 199 233 159   CMP Latest Ref Rng & Units 12/05/2020 12/03/2020 12/02/2020  Glucose 70 - 99 mg/dL 108(H) 150(H) 269(H)  BUN 8 - 23 mg/dL 42(H) 36(H) 34(H)  Creatinine 0.61 - 1.24 mg/dL 1.54(H) 1.54(H) 1.63(H)  Sodium 135 - 145 mmol/L 141 141 136  Potassium 3.5 - 5.1 mmol/L 3.7 4.5 4.5  Chloride 98 - 111 mmol/L 100 103 102  CO2 22 - 32 mmol/L 28 24 26   Calcium 8.9 - 10.3 mg/dL 9.6 10.6(H) 10.5(H)  Total Protein 6.5 - 8.1 g/dL - - 7.4  Total Bilirubin 0.3 - 1.2 mg/dL - - 0.9  Alkaline Phos 38 - 126 U/L - - 51  AST 15 - 41 U/L - - 40  ALT 0 - 44 U/L - - 36    Imaging studies: No new pertinent imaging studies   Assessment/Plan:  86 y.o.malewith small bowel obstruction, complicated by  pertinent comorbidities includingdiabetes, chronic kidney disease, pancreatic insufficiency.  Today patient continue feeling good. Denies nausea. Report passing gas. Will put NGT to suction and assess if he tolerates without nausea or distention. Will consider clear liquid trial later if no clinical deterioration and if he continue passing gas. Encourage the patient to ambulate.   Arnold Long, MD

## 2020-12-06 DIAGNOSIS — K56609 Unspecified intestinal obstruction, unspecified as to partial versus complete obstruction: Secondary | ICD-10-CM

## 2020-12-06 LAB — CBC WITH DIFFERENTIAL/PLATELET
Abs Immature Granulocytes: 0.04 10*3/uL (ref 0.00–0.07)
Basophils Absolute: 0 10*3/uL (ref 0.0–0.1)
Basophils Relative: 0 %
Eosinophils Absolute: 0.1 10*3/uL (ref 0.0–0.5)
Eosinophils Relative: 0 %
HCT: 41.4 % (ref 39.0–52.0)
Hemoglobin: 13.6 g/dL (ref 13.0–17.0)
Immature Granulocytes: 0 %
Lymphocytes Relative: 51 %
Lymphs Abs: 8.2 10*3/uL — ABNORMAL HIGH (ref 0.7–4.0)
MCH: 33.5 pg (ref 26.0–34.0)
MCHC: 32.9 g/dL (ref 30.0–36.0)
MCV: 102 fL — ABNORMAL HIGH (ref 80.0–100.0)
Monocytes Absolute: 1.9 10*3/uL — ABNORMAL HIGH (ref 0.1–1.0)
Monocytes Relative: 11 %
Neutro Abs: 6.4 10*3/uL (ref 1.7–7.7)
Neutrophils Relative %: 38 %
Platelets: 169 10*3/uL (ref 150–400)
RBC: 4.06 MIL/uL — ABNORMAL LOW (ref 4.22–5.81)
RDW: 12.6 % (ref 11.5–15.5)
Smear Review: NORMAL
WBC: 16.6 10*3/uL — ABNORMAL HIGH (ref 4.0–10.5)
nRBC: 0 % (ref 0.0–0.2)

## 2020-12-06 LAB — BASIC METABOLIC PANEL
Anion gap: 9 (ref 5–15)
BUN: 34 mg/dL — ABNORMAL HIGH (ref 8–23)
CO2: 29 mmol/L (ref 22–32)
Calcium: 9 mg/dL (ref 8.9–10.3)
Chloride: 97 mmol/L — ABNORMAL LOW (ref 98–111)
Creatinine, Ser: 1.28 mg/dL — ABNORMAL HIGH (ref 0.61–1.24)
GFR, Estimated: 56 mL/min — ABNORMAL LOW (ref >60)
Glucose, Bld: 80 mg/dL (ref 70–99)
Potassium: 3.6 mmol/L (ref 3.5–5.1)
Sodium: 135 mmol/L (ref 135–145)

## 2020-12-06 LAB — PATHOLOGIST SMEAR REVIEW

## 2020-12-06 LAB — GLUCOSE, CAPILLARY
Glucose-Capillary: 150 mg/dL — ABNORMAL HIGH (ref 70–99)
Glucose-Capillary: 173 mg/dL — ABNORMAL HIGH (ref 70–99)

## 2020-12-06 MED ORDER — ONDANSETRON 4 MG PO TBDP: 4.0000 mg | ORAL_TABLET | Freq: Three times a day (TID) | ORAL | 0 refills | Status: DC | PRN

## 2020-12-06 MED ORDER — SENNOSIDES-DOCUSATE SODIUM 8.6-50 MG PO TABS: 1.0000 | ORAL_TABLET | Freq: Two times a day (BID) | ORAL | 0 refills | Status: DC | PRN

## 2020-12-06 NOTE — Discharge Instructions (Signed)
Bowel Obstruction A bowel obstruction means that something is blocking the small or large bowel. The bowel is also called the intestine. It is the long tube that connects the stomach to the opening of the butt (anus). When something blocks the bowel, food and fluids cannot pass through like normal. This condition needs to be treated. Treatment depends on the cause of the problem and how bad the problem is. What are the causes? Common causes of this condition include:  Scar tissue (adhesions) from past surgery or from high-energy X-rays (radiation).  Recent surgery in the belly. This affects how food moves in the bowel.  Some diseases, such as: ? Irritation of the lining of the digestive tract (Crohn's disease). ? Irritation of small pouches in the bowel (diverticulitis).  Growths or tumors.  A bulging organ (hernia).  Twisting of the bowel (volvulus).  A foreign body.  Slipping of a part of the bowel into another part (intussusception).   What are the signs or symptoms? Symptoms of this condition include:  Pain in the belly.  Feeling sick to your stomach (nauseous).  Throwing up (vomiting).  Bloating in the belly.  Being unable to pass gas.  Trouble pooping (constipation).  Watery poop (diarrhea).  A lot of belching. How is this diagnosed? This condition may be diagnosed based on:  A physical exam.  Medical history.  Imaging tests, such as X-ray or CT scan.  Blood tests.  Urine tests. How is this treated? Treatment for this condition may include:  Fluids and pain medicines that are given through an IV tube. Your doctor may tell you not to eat or drink if you feel sick to your stomach and are throwing up.  Eating a clear liquid diet for a few days.  Putting a small tube (nasogastric tube) into the stomach. This will help with pain, discomfort, and nausea by removing blocked air and fluids from the stomach.  Surgery. This may be needed if other treatments do  not work. Follow these instructions at home: Medicines  Take over-the-counter and prescription medicines only as told by your doctor.  If you were prescribed an antibiotic medicine, take it as told by your doctor. Do not stop taking the antibiotic even if you start to feel better. General instructions  Follow your diet as told by your doctor. You may need to: ? Only drink clear liquids until you start to get better. ? Avoid solid foods.  Return to your normal activities as told by your doctor. Ask your doctor what activities are safe for you.  Do not sit for a long time without moving. Get up to take short walks every 1-2 hours. This is important. Ask for help if you feel weak or unsteady.  Keep all follow-up visits as told by your doctor. This is important. How is this prevented? After having a bowel obstruction, you may be more likely to have another. You can do some things to stop it from happening again.  If you have a long-term (chronic) disease, contact your doctor if you see changes or problems.  Take steps to prevent or treat trouble pooping. Your doctor may ask that you: ? Drink enough fluid to keep your pee (urine) pale yellow. ? Take over-the-counter or prescription medicines. ? Eat foods that are high in fiber. These include beans, whole grains, and fresh fruits and vegetables. ? Limit foods that are high in fat and sugar. These include fried or sweet foods.  Stay active. Ask your doctor which   exercises are safe for you.  Avoid stress.  Eat three small meals and three small snacks each day.  Work with a food expert (dietitian) to make a meal plan that works for you.  Do not use any products that contain nicotine or tobacco, such as cigarettes and e-cigarettes. If you need help quitting, ask your doctor.   Contact a doctor if:  You have a fever.  You have chills. Get help right away if:  You have pain or cramps that get worse.  You throw up blood.  You are  sick to your stomach.  You cannot stop throwing up.  You cannot drink fluids.  You feel mixed up (confused).  You feel very thirsty (dehydrated).  Your belly gets more bloated.  You feel weak or you pass out (faint). Summary  A bowel obstruction means that something is blocking the small or large bowel.  Treatment may include IV fluids and pain medicine. You may also have a clear liquid diet, a small tube in your stomach, or surgery.  Drink clear liquids and avoid solid foods until you get better. This information is not intended to replace advice given to you by your health care provider. Make sure you discuss any questions you have with your health care provider. Document Revised: 12/03/2017 Document Reviewed: 12/03/2017 Elsevier Patient Education  2021 [employer]  

## 2020-12-06 NOTE — Discharge Summary (Signed)
Physician Discharge Summary  Daniel Bray QBH:[id number] DOB: 23-Feb-1935 DOA: 12/02/2020  PCP: Elza Rafter, MD  Admit date: 12/02/2020 Discharge date: 12/06/2020  Admitted From: Home Disposition: Home  Recommendations for Outpatient Follow-up:  1. Follow up with PCP in 1-2 weeks 2.   Home Health: No Equipment/Devices: None Discharge Condition: Stable CODE STATUS: Full Diet recommendation: Soft Brief/Interim Summary: 86 y.o. male with medical history significant for HTN, DM, hypothyroidism, CAD status post CABG, CKD lllb, history of renal cell, colon pancreatic cancer status post nephrectomy, subtotal colectomy and multiple other abdominal surgeries, who presents to the emergency room with a complaint of epigastric pain starting around dinner associated with nausea.  He denied vomiting, diaphoresis, constipation or diarrhea or dysuria and denied cough, fever or chills or chest pain.  Epigastric pain is of severe intensity, nonradiating, improved with burping but otherwise constant and sharp.  While waiting to be seen in the emergency room he had a syncopal episode and was readily brought back to triage  Patient was found to have an SBO.  General surgery was consulted.  Patient underwent NG tube placement at bedside.  Tolerated well.  Patient's abdominal pain and clinical signs of obstruction gradually improved.  NG tube was inadvertently discontinued on 5/2.  Tolerated discontinuation well.  Replacement was not necessary.  I will slowly advance to clear liquids then subsequently to soft diet on day of discharge.  On day of discharge patient was pain-free.  Belly was soft.  Positive bowel sounds.  Passing flatus without issue.  Tolerating soft diet without issue, stable for discharge home at this time.  Notify general surgery of patient's discharge.  On discharge patient recommended to adhere to soft diet and small meals until complete resolution of symptoms.  Discharge Diagnoses:   Principal Problem:   SBO (small bowel obstruction) (HCC) Active Problems:   Syncope   Type 2 diabetes mellitus with hyperlipidemia (HCC)   Accelerated hypertension   History of renal cell carcinoma s/p nephrectomy   Hypothyroidism   Elevated troponin   Stage 3b chronic kidney disease (HCC)   Hx of CABG   Hiccups  Small bowel obstruction Tolerating soft diet at time of discharge with good bowel sounds and passing flatus Discharge home with recommendations to continue soft diet and small meals at time of discharge.  As needed senna/docusate and Zofran prescribed at time of discharge  Hiccups Resolved at time of discharge.  Required Thorazine  Hypertension Resume home regimen.  Follow-up with PCP  Chronic kidney disease stage IIIb Creatinine baseline at time of discharge   Discharge Instructions  Discharge Instructions    Diet - low sodium heart healthy   Complete by: As directed    Increase activity slowly   Complete by: As directed      Allergies as of 12/06/2020      Reactions   Levofloxacin Hives, Other (See Comments)   Levaquin [levofloxacin In D5w]    Weakness, low blood pressure   Januvia [sitagliptin] Rash      Medication List    TAKE these medications   acetaminophen 650 MG CR tablet Commonly known as: TYLENOL Take 650 mg by mouth 2 (two) times daily.   ALPRAZolam 0.25 MG tablet Commonly known as: XANAX Take 0.125 mg by mouth at bedtime as needed for sleep.   aspirin EC 81 MG tablet Take 81 mg by mouth daily.   carvedilol 6.25 MG tablet Commonly known as: COREG Take 6.25 mg by mouth 2 (two) times daily with  a meal.   cholecalciferol 1000 units tablet Commonly known as: VITAMIN D Take 2,000 Units by mouth daily.   Coricidin HBP 10-325-2 MG Tabs Generic drug: DM-APAP-CPM Take 10 mg by mouth 2 (two) times daily as needed (cold symptoms).   Dulaglutide 3 MG/0.5ML Sopn Inject 3 mg into the skin once a week.   empagliflozin 25 MG Tabs  tablet Commonly known as: JARDIANCE Take 25 mg by mouth daily.   gabapentin 300 MG capsule Commonly known as: NEURONTIN Take 300 mg by mouth 2 (two) times daily.   glipiZIDE 10 MG tablet Commonly known as: GLUCOTROL Take 10 mg by mouth 2 (two) times daily with a meal.   hydrALAZINE 25 MG tablet Commonly known as: APRESOLINE Take 25 mg by mouth 2 (two) times daily. What changed: Another medication with the same name was removed. Continue taking this medication, and follow the directions you see here.   levocetirizine 5 MG tablet Commonly known as: XYZAL Take 5 mg by mouth daily.   levothyroxine 75 MCG tablet Commonly known as: SYNTHROID Take 75 mcg by mouth daily before breakfast.   lisinopril 20 MG tablet Commonly known as: ZESTRIL Take 20 mg by mouth daily.   omeprazole 40 MG capsule Commonly known as: PRILOSEC Take 40 mg by mouth daily.   ondansetron 4 MG disintegrating tablet Commonly known as: Zofran ODT Take 1 tablet (4 mg total) by mouth every 8 (eight) hours as needed for nausea or vomiting.   pyridoxine 100 MG tablet Commonly known as: B-6 Take 100 mg by mouth daily.   rosuvastatin 40 MG tablet Commonly known as: CRESTOR Take 40 mg by mouth daily.   senna-docusate 8.6-50 MG tablet Commonly known as: Senokot-S Take 1 tablet by mouth 2 (two) times daily as needed for mild constipation or moderate constipation.   Simethicone 80 MG Tabs Take 1 tablet (80 mg total) by mouth 4 (four) times daily as needed. What changed: reasons to take this   Zenpep 25000-79000 units Cpep Generic drug: Pancrelipase (Lip-Prot-Amyl) Take 2 capsules by mouth See admin instructions. Take 2 capsules by mouth three times daily with meals and take 1 capsule twice daily with a snack       Follow-up Information    Jose-Mathews, Jessnie, MD. Schedule an appointment as soon as possible for a visit on 12/12/2020.   Specialty: Family Medicine Why: @ 12:20pm Contact  information: [place] [address] [phone]              Allergies  Allergen Reactions  . Levofloxacin Hives and Other (See Comments)  . Levaquin [Levofloxacin In D5w]     Weakness, low blood pressure  . Januvia [Sitagliptin] Rash    Consultations:  General surgery   Procedures/Studies: DG Abd 1 View  Result Date: 12/04/2020 CLINICAL DATA:  Enteric catheter placement EXAM: ABDOMEN - 1 VIEW COMPARISON:  12/04/2020 FINDINGS: Frontal view of the abdomen and pelvis excludes the lower pelvis and left flank by collimation. Enteric catheter tip and side port project over the gastric body. Gaseous distention of the small bowel is again noted most pronounced in the upper abdomen measuring up to 4.1 cm. Oral contrast seen within the distal colon as before. IMPRESSION: 1. Enteric catheter overlying gastric fundus. 2. Stable nonspecific gaseous distention of the bowel. Electronically Signed   By: Randa Ngo M.D.   On: 12/04/2020 17:26   DG Chest Portable 1 View  Result Date: 12/02/2020 CLINICAL DATA:  86 year old male NG tube placement.  EXAM: PORTABLE CHEST 1 VIEW COMPARISON:  CT Chest, Abdomen, and Pelvis today are reported separately. 0055 hours the same day. FINDINGS: Portable AP upright view at 0445 hours. Enteric tube tip in the left upper quadrant, but side hole projects at the level of the diaphragm. Low lung volumes. Mediastinal contours remain normal. Prior CABG. Stable visible bowel gas pattern. No pneumoperitoneum identified. Numerous upper abdominal surgical clips. IMPRESSION: 1. Enteric tube placed into the stomach but side hole at the level of the GEJ. Advance 6 cm to ensure side hole placement in the stomach. 2. Stable visible bowel gas pattern. Electronically Signed   By: Genevie Ann M.D.   On: 12/02/2020 06:44   DG Abd 2 Views  Result Date: 12/04/2020 CLINICAL DATA:  Abdominal pain EXAM: ABDOMEN - 2 VIEW COMPARISON:  Dec 04, 2020 study obtained earlier in the  day FINDINGS: Supine and upright images obtained. Nasogastric tube tip and side port remain in proximal stomach. Mild bowel dilatation persists without appreciable air-fluid levels. No free air. Multiple clips in abdomen. No change in lung bases. IMPRESSION: No change in nasogastric tube positioning. Suspect a degree of ileus or enteritis. Appearance similar to earlier in the day. No free air demonstrable. Electronically Signed   By: Lowella Grip III M.D.   On: 12/04/2020 09:30   DG Abd 2 Views  Result Date: 12/03/2020 CLINICAL DATA:  Small-bowel obstruction EXAM: ABDOMEN - 2 VIEW COMPARISON:  12/02/2020 FINDINGS: Supine and upright frontal views of the abdomen and pelvis are obtained. Enteric catheter unchanged within the gastric body. Retained oral contrast within the gastric lumen, with slow diffusion of contrast into multiple dilated loops of small bowel within the left upper quadrant. Numerous gas fluid levels consistent with high-grade small bowel obstruction. There is no evidence of contrast progression into the remaining rectosigmoid colon, in this patient with subtotal colectomy. No masses or abnormal calcifications. IMPRESSION: 1. Slow diffusion of oral contrast throughout dilated loops of small bowel within the upper abdomen, consistent with high-grade obstruction. No evidence of contrast progression into the remaining rectosigmoid colon. Electronically Signed   By: Randa Ngo M.D.   On: 12/03/2020 15:38   DG Abd Portable 1V  Result Date: 12/04/2020 CLINICAL DATA:  Nasogastric tube placement EXAM: PORTABLE ABDOMEN - 1 VIEW COMPARISON:  Dec 03, 2020 FINDINGS: Nasogastric tube tip and side port are in the proximal stomach. There are loops of mildly dilated bowel without air-fluid levels. Contrast is seen in the distal colon and rectum. No free air evident. There are multiple surgical clips in the abdomen. There is mild left base atelectasis. IMPRESSION: Nasogastric tube tip and side port in  proximal stomach. Loops of mildly dilated small bowel, likely representing a degree of enteritis or ileus. Bowel obstruction not felt to be present. Contrast present in distal large bowel. No free air. Electronically Signed   By: Lowella Grip III M.D.   On: 12/04/2020 07:55   DG Abd Portable 1V-Small Bowel Obstruction Protocol-initial, 8 hr delay  Result Date: 12/02/2020 CLINICAL DATA:  Small-bowel obstruction EXAM: PORTABLE ABDOMEN - 1 VIEW COMPARISON:  12/02/2020 FINDINGS: Two supine frontal views of the abdomen and pelvis demonstrate enteric catheter within the gastric fundus. Oral contrast is seen within the gastric fundus. Gaseous distention of the small bowel within the left upper quadrant unchanged since preceding CT. Excreted contrast within the urinary bladder. No masses or abnormal calcifications. Extensive postsurgical changes with numerous clips throughout the retroperitoneum. IMPRESSION: 1. Continued dilated small bowel left upper  quadrant consistent with small-bowel obstruction. 2. Enteric catheter within the gastric lumen, with contrast filling the gastric fundus. Electronically Signed   By: Randa Ngo M.D.   On: 12/02/2020 19:06   CT Angio Chest/Abd/Pel for Dissection W and/or Wo Contrast  Result Date: 12/02/2020 CLINICAL DATA:  Epigastric pain. EXAM: CT ANGIOGRAPHY CHEST, ABDOMEN AND PELVIS TECHNIQUE: Non-contrast CT of the chest was initially obtained. Multidetector CT imaging through the chest, abdomen and pelvis was performed using the standard protocol during bolus administration of intravenous contrast. Multiplanar reconstructed images and MIPs were obtained and reviewed to evaluate the vascular anatomy. CONTRAST:  174mL OMNIPAQUE IOHEXOL 350 MG/ML SOLN COMPARISON:  None. FINDINGS: CTA CHEST FINDINGS Cardiovascular: There is mild calcification of the aortic arch without evidence of aneurysmal dilatation or dissection satisfactory opacification of the pulmonary arteries to the  segmental level. No evidence of pulmonary embolism. Normal heart size with marked severity coronary artery calcification. Coronary artery vascular clips are noted. No pericardial effusion. Mediastinum/Nodes: No enlarged mediastinal, hilar, or axillary lymph nodes. Thyroid gland, trachea, and esophagus demonstrate no significant findings. Lungs/Pleura: Very mild atelectasis is seen within the inferior aspect of the left upper lobe and posterior aspects of the bilateral lung bases. There is no evidence of a pleural effusion or pneumothorax. Musculoskeletal: Multiple sternal wires are noted. Multilevel degenerative changes seen throughout the thoracic spine. Review of the MIP images confirms the above findings. CTA ABDOMEN AND PELVIS FINDINGS VASCULAR Aorta: Normal caliber aorta without aneurysm, dissection, vasculitis or significant stenosis. Celiac: Patent without evidence of aneurysm, dissection, vasculitis or significant stenosis. SMA: Narrowing along its origin without evidence of aneurysm, dissection, vasculitis or significant stenosis. Renals: The right renal artery is patent without evidence of aneurysm, dissection, vasculitis, fibromuscular dysplasia or significant stenosis. IMA: Patent without evidence of aneurysm, dissection, vasculitis or significant stenosis. Inflow: Patent without evidence of aneurysm, dissection, vasculitis or significant stenosis. Veins: No obvious venous abnormality within the limitations of this arterial phase study. Review of the MIP images confirms the above findings. NON-VASCULAR Hepatobiliary: No focal liver abnormality is seen. No gallstones, gallbladder wall thickening, or biliary dilatation. Pancreas: Fatty replacement of the pancreatic parenchyma is seen. A stable 2.4 cm x 2.2 cm enhancing area of soft tissue attenuation is again seen along the expected region of the pancreatic head. Spleen: The spleen is surgically absent. Adrenals/Urinary Tract: The right adrenal gland is  unremarkable. The left kidney is surgically absent. Mild cortical scarring is seen involving the right kidney without evidence of renal calculi or hydronephrosis. Bladder is unremarkable. Stomach/Bowel: The stomach is moderately distended. Surgically anastomosed bowel is seen along the medial aspect of the mid and lower right abdomen, consistent with the patient's history of prior subtotal colectomy. Numerous dilated small bowel loops are seen throughout the left abdomen (maximum small bowel diameter of approximately 4.1 cm). A transition zone is seen within the posterior aspect of the mid left abdomen (axial CT images 115 through 122, CT series number 5). Noninflamed diverticula are seen throughout the sigmoid colon. Lymphatic: No abnormal abdominal or pelvic lymph nodes are identified. Reproductive: The prostate gland is moderately enlarged. Other: No abdominal wall hernia or abnormality. No abdominopelvic ascites. Musculoskeletal: A total right hip replacement is seen with associated streak artifact and subsequently limited evaluation of the adjacent osseous and soft tissue structures. Marked severity degenerative changes are seen at the level of L4-L5 and L5-S1. Review of the MIP images confirms the above findings. IMPRESSION: 1. Findings consistent with a partial small bowel obstruction.  2. Extensive postoperative changes consistent with the patient's history of prior subtotal colectomy. 3. Evidence of prior median sternotomy the/CABG. 4. Prior splenectomy and left nephrectomy. 5. Sigmoid diverticulosis. Electronically Signed   By: Virgina Norfolk M.D.   On: 12/02/2020 03:34    (Echo, Carotid, EGD, Colonoscopy, ERCP)    Subjective: Seen and examined the day of discharge.  Stable, no distress.  No abdominal pain.  Tolerating soft diet.  Stable for discharge home.  Discharge Exam: Vitals:   12/06/20 0900 12/06/20 1126  BP: 140/81 134/80  Pulse: 88 75  Resp: 19 18  Temp: 98.7 F (37.1 C) 97.6 F  (36.4 C)  SpO2: 95% 92%   Vitals:   12/05/20 2023 12/06/20 0411 12/06/20 0900 12/06/20 1126  BP: (!) 162/81 (!) 144/78 140/81 134/80  Pulse: 94 83 88 75  Resp: 20 20 19 18   Temp: 98.1 F (36.7 C) 98.6 F (37 C) 98.7 F (37.1 C) 97.6 F (36.4 C)  TempSrc: Oral Oral Oral Oral  SpO2: 98% 95% 95% 92%  Weight:  65.6 kg    Height:        General: Pt is alert, awake, not in acute distress Cardiovascular: RRR, S1/S2 +, no rubs, no gallops Respiratory: CTA bilaterally, no wheezing, no rhonchi Abdominal: Soft, NT, ND, bowel sounds + Extremities: no edema, no cyanosis    The results of significant diagnostics from this hospitalization (including imaging, microbiology, ancillary and laboratory) are listed below for reference.     Microbiology: Recent Results (from the past 240 hour(s))  Resp Panel by RT-PCR (Flu A&B, Covid) Nasopharyngeal Swab     Status: None   Collection Time: 12/02/20  4:45 AM   Specimen: Nasopharyngeal Swab; Nasopharyngeal(NP) swabs in vial transport medium  Result Value Ref Range Status   SARS Coronavirus 2 by RT PCR NEGATIVE NEGATIVE Final    Comment: (NOTE) SARS-CoV-2 target nucleic acids are NOT DETECTED.  The SARS-CoV-2 RNA is generally detectable in upper respiratory specimens during the acute phase of infection. The lowest concentration of SARS-CoV-2 viral copies this assay can detect is 138 copies/mL. A negative result does not preclude SARS-Cov-2 infection and should not be used as the sole basis for treatment or other patient management decisions. A negative result may occur with  improper specimen collection/handling, submission of specimen other than nasopharyngeal swab, presence of viral mutation(s) within the areas targeted by this assay, and inadequate number of viral copies(<138 copies/mL). A negative result must be combined with clinical observations, patient history, and epidemiological information. The expected result is  Negative.  Fact Sheet for Patients:  EntrepreneurPulse.com.au  Fact Sheet for Healthcare Providers:  IncredibleEmployment.be  This test is no t yet approved or cleared by the [place] FDA and  has been authorized for detection and/or diagnosis of SARS-CoV-2 by FDA under an Emergency Use Authorization (EUA). This EUA will remain  in effect (meaning this test can be used) for the duration of the COVID-19 declaration under Section 564(b)(1) of the Act, 21 U.S.C.section 360bbb-3(b)(1), unless the authorization is terminated  or revoked sooner.       Influenza A by PCR NEGATIVE NEGATIVE Final   Influenza B by PCR NEGATIVE NEGATIVE Final    Comment: (NOTE) The Xpert Xpress SARS-CoV-2/FLU/RSV plus assay is intended as an aid in the diagnosis of influenza from Nasopharyngeal swab specimens and should not be used as a sole basis for treatment. Nasal washings and aspirates are unacceptable for Xpert Xpress SARS-CoV-2/FLU/RSV testing.  Fact Sheet for Patients:  EntrepreneurPulse.com.au  Fact Sheet for Healthcare Providers: IncredibleEmployment.be  This test is not yet approved or cleared by the [place] FDA and has been authorized for detection and/or diagnosis of SARS-CoV-2 by FDA under an Emergency Use Authorization (EUA). This EUA will remain in effect (meaning this test can be used) for the duration of the COVID-19 declaration under Section 564(b)(1) of the Act, 21 U.S.C. section 360bbb-3(b)(1), unless the authorization is terminated or revoked.  Performed at [facility], [address] [place], [place] [address]      Labs: BNP (last 3 results) Recent Labs    01/24/20 1116  BNP 123456*   Basic Metabolic Panel: Recent Labs  Lab 12/02/20 0022 12/03/20 0759 12/05/20 0340 12/06/20 0800  NA 136 141 141 135  K 4.5 4.5 3.7 3.6  CL 102 103 100 97*  CO2 26 24 28 29   GLUCOSE 269*  150* 108* 80  BUN 34* 36* 42* 34*  CREATININE 1.63* 1.54* 1.54* 1.28*  CALCIUM 10.5* 10.6* 9.6 9.0   Liver Function Tests: Recent Labs  Lab 12/02/20 0022  AST 40  ALT 36  ALKPHOS 51  BILITOT 0.9  PROT 7.4  ALBUMIN 4.1   Recent Labs  Lab 12/02/20 0022  LIPASE 20   No results for input(s): AMMONIA in the last 168 hours. CBC: Recent Labs  Lab 12/02/20 0022 12/05/20 0340 12/06/20 0800  WBC 16.4* 21.0* 16.6*  NEUTROABS 9.7*  --  6.4  HGB 15.8 13.3 13.6  HCT 48.6 41.5 41.4  MCV 102.7* 104.5* 102.0*  PLT 233 199 169   Cardiac Enzymes: No results for input(s): CKTOTAL, CKMB, CKMBINDEX, TROPONINI in the last 168 hours. BNP: Invalid input(s): POCBNP CBG: Recent Labs  Lab 12/05/20 1628 12/05/20 2012 12/05/20 2312 12/06/20 0409 12/06/20 1126  GLUCAP 122* 173* 147* 150* 173*   D-Dimer No results for input(s): DDIMER in the last 72 hours. Hgb A1c No results for input(s): HGBA1C in the last 72 hours. Lipid Profile No results for input(s): CHOL, HDL, LDLCALC, TRIG, CHOLHDL, LDLDIRECT in the last 72 hours. Thyroid function studies No results for input(s): TSH, T4TOTAL, T3FREE, THYROIDAB in the last 72 hours.  Invalid input(s): FREET3 Anemia work up No results for input(s): VITAMINB12, FOLATE, FERRITIN, TIBC, IRON, RETICCTPCT in the last 72 hours. Urinalysis    Component Value Date/Time   COLORURINE STRAW (A) 12/02/2020 0238   APPEARANCEUR CLEAR (A) 12/02/2020 0238   LABSPEC 1.035 (H) 12/02/2020 0238   PHURINE 5.0 12/02/2020 0238   GLUCOSEU >=500 (A) 12/02/2020 0238   HGBUR NEGATIVE 12/02/2020 0238   BILIRUBINUR NEGATIVE 12/02/2020 0238   KETONESUR NEGATIVE 12/02/2020 0238   PROTEINUR NEGATIVE 12/02/2020 0238   NITRITE NEGATIVE 12/02/2020 0238   LEUKOCYTESUR NEGATIVE 12/02/2020 0238   Sepsis Labs Invalid input(s): PROCALCITONIN,  WBC,  LACTICIDVEN Microbiology Recent Results (from the past 240 hour(s))  Resp Panel by RT-PCR (Flu A&B, Covid) Nasopharyngeal  Swab     Status: None   Collection Time: 12/02/20  4:45 AM   Specimen: Nasopharyngeal Swab; Nasopharyngeal(NP) swabs in vial transport medium  Result Value Ref Range Status   SARS Coronavirus 2 by RT PCR NEGATIVE NEGATIVE Final    Comment: (NOTE) SARS-CoV-2 target nucleic acids are NOT DETECTED.  The SARS-CoV-2 RNA is generally detectable in upper respiratory specimens during the acute phase of infection. The lowest concentration of SARS-CoV-2 viral copies this assay can detect is 138 copies/mL. A negative result does not preclude SARS-Cov-2 infection and should not be used as the  sole basis for treatment or other patient management decisions. A negative result may occur with  improper specimen collection/handling, submission of specimen other than nasopharyngeal swab, presence of viral mutation(s) within the areas targeted by this assay, and inadequate number of viral copies(<138 copies/mL). A negative result must be combined with clinical observations, patient history, and epidemiological information. The expected result is Negative.  Fact Sheet for Patients:  EntrepreneurPulse.com.au  Fact Sheet for Healthcare Providers:  IncredibleEmployment.be  This test is no t yet approved or cleared by the [place] FDA and  has been authorized for detection and/or diagnosis of SARS-CoV-2 by FDA under an Emergency Use Authorization (EUA). This EUA will remain  in effect (meaning this test can be used) for the duration of the COVID-19 declaration under Section 564(b)(1) of the Act, 21 U.S.C.section 360bbb-3(b)(1), unless the authorization is terminated  or revoked sooner.       Influenza A by PCR NEGATIVE NEGATIVE Final   Influenza B by PCR NEGATIVE NEGATIVE Final    Comment: (NOTE) The Xpert Xpress SARS-CoV-2/FLU/RSV plus assay is intended as an aid in the diagnosis of influenza from Nasopharyngeal swab specimens and should not be used as a sole  basis for treatment. Nasal washings and aspirates are unacceptable for Xpert Xpress SARS-CoV-2/FLU/RSV testing.  Fact Sheet for Patients: EntrepreneurPulse.com.au  Fact Sheet for Healthcare Providers: IncredibleEmployment.be  This test is not yet approved or cleared by the [place] FDA and has been authorized for detection and/or diagnosis of SARS-CoV-2 by FDA under an Emergency Use Authorization (EUA). This EUA will remain in effect (meaning this test can be used) for the duration of the COVID-19 declaration under Section 564(b)(1) of the Act, 21 U.S.C. section 360bbb-3(b)(1), unless the authorization is terminated or revoked.  Performed at [facility], [address] [place], [place] [address]      Time coordinating discharge: Over 30 minutes  SIGNED:   Sidney Ace, MD  [facility] 12/06/2020, 2:16 PM Pager   If 7PM-7AM, please contact night-coverage

## 2020-12-06 NOTE — TOC Transition Note (Signed)
Transition of Care [facility]) - CM/SW Discharge Note   Patient Details  Name: Daniel Bray MRN: [id number] Date of Birth: Jan 25, 1935  Transition of Care [facility]) CM/SW Contact:  Alberteen Sam, LCSW Phone Number: 12/06/2020, 2:20 PM   Clinical Narrative:     Patient and spouse reports no discharge needs at this time and confirmed they are declining the recommended home health services.   No further needs identified, CSW signing off.   Final next level of care: Home/Self Care Barriers to Discharge: No Barriers Identified   Patient Goals and CMS Choice Patient states their goals for this hospitalization and ongoing recovery are:: to go home CMS Medicare.gov Compare Post Acute Care list provided to:: Patient Choice offered to / list presented to : Patient  Discharge Placement                       Discharge Plan and Services In-house Referral: Clinical Social Work   Post Acute Care Choice: Home Health          DME Arranged: N/A DME Agency: NA       HH Arranged: Patient Refused [place] Agency: NA        Social Determinants of Health (SDOH) Interventions     Readmission Risk Interventions No flowsheet data found.

## 2020-12-06 NOTE — Plan of Care (Signed)
DISCHARGE NOTE HOME Daniel Bray to be discharged home per MD order. Discussed prescriptions and follow up appointments with the patient. Medication list explained in detail. Patient verbalized understanding.  Skin clean, dry and intact without evidence of skin break down, no evidence of skin tears noted. IV catheter discontinued intact. Site without signs and symptoms of complications. Dressing and pressure applied. Pt denies pain at the site currently. No complaints noted.  Patient free of lines, drains, and wounds.   An After Visit Summary (AVS) was printed and given to the patient. Patient escorted via wheelchair, and discharged home via private auto.  Stephan Minister, RN

## 2021-01-03 ENCOUNTER — Other Ambulatory Visit: Payer: Self-pay | Admitting: Gastroenterology

## 2021-01-03 ENCOUNTER — Other Ambulatory Visit (HOSPITAL_COMMUNITY): Payer: Self-pay | Admitting: Gastroenterology

## 2021-01-03 DIAGNOSIS — R6881 Early satiety: Secondary | ICD-10-CM

## 2021-01-26 ENCOUNTER — Ambulatory Visit
Admission: RE | Admit: 2021-01-26 | Discharge: 2021-01-26 | Disposition: A | Discharge status: Home / Self Care | Payer: Medicare HMO | Source: Ambulatory Visit | Attending: Gastroenterology | Admitting: Gastroenterology

## 2021-01-26 ENCOUNTER — Other Ambulatory Visit: Payer: Self-pay

## 2021-01-26 DIAGNOSIS — R6881 Early satiety: Secondary | ICD-10-CM | POA: Insufficient documentation

## 2021-01-26 MED ORDER — TECHNETIUM TC 99M SULFUR COLLOID
2.0000 | Freq: Once | INTRAVENOUS | Status: AC | PRN
  Administered 2021-01-26: 1.92 via ORAL

## 2021-05-18 ENCOUNTER — Ambulatory Visit
Admission: EM | Admit: 2021-05-18 | Discharge: 2021-05-18 | Disposition: A | Discharge status: Home / Self Care | Payer: Medicare HMO | Attending: Physician Assistant | Admitting: Physician Assistant

## 2021-05-18 ENCOUNTER — Other Ambulatory Visit: Payer: Self-pay

## 2021-05-18 DIAGNOSIS — S00412A Abrasion of left ear, initial encounter: Secondary | ICD-10-CM

## 2021-05-18 DIAGNOSIS — H6992 Unspecified Eustachian tube disorder, left ear: Secondary | ICD-10-CM

## 2021-05-18 DIAGNOSIS — E119 Type 2 diabetes mellitus without complications: Secondary | ICD-10-CM | POA: Diagnosis present

## 2021-05-18 DIAGNOSIS — H6982 Other specified disorders of Eustachian tube, left ear: Secondary | ICD-10-CM | POA: Insufficient documentation

## 2021-05-18 DIAGNOSIS — R350 Frequency of micturition: Secondary | ICD-10-CM | POA: Diagnosis present

## 2021-05-18 LAB — URINALYSIS, COMPLETE (UACMP) WITH MICROSCOPIC
Bacteria, UA: NONE SEEN
Bilirubin Urine: NEGATIVE
Glucose, UA: 500 mg/dL — AB
Hgb urine dipstick: NEGATIVE
Ketones, ur: NEGATIVE mg/dL
Leukocytes,Ua: NEGATIVE
Nitrite: NEGATIVE
Protein, ur: NEGATIVE mg/dL
Specific Gravity, Urine: 1.01 (ref 1.005–1.030)
pH: 5.5 (ref 5.0–8.0)

## 2021-05-18 LAB — GLUCOSE, CAPILLARY: Glucose-Capillary: 202 mg/dL — ABNORMAL HIGH (ref 70–99)

## 2021-05-18 NOTE — Discharge Instructions (Addendum)
-  There is no sign of UTI based on your urine.  He did have sugar in your urine and your blood sugar was elevated at 200 today but you did just recently have a large breakfast with carbohydrates.  Keep an eye on your blood sugars.  Make sure to stay hydrated.  Follow-up with your PCP if urinary frequency is not getting better over the next week. -No sign of an ear infection.  As we discussed there is a small amount of fluid behind your eardrum which can cause some eustachian tube dysfunction resulting in popping and pressure of the ear.  It is great that it is no longer hurting.  He may continue to have symptoms over the next week or 2 as the fluid continues to dry up.  Continue with her allergy medication and nasal spray but if it becomes painful or you have a fever you should return. -If your fatigue is not improving over the next few days or you develop new/worsening symptoms you should be seen again for another exam.

## 2021-05-18 NOTE — ED Provider Notes (Signed)
[facility]    CSN: [id number] Arrival date & time: 05/18/21  0827      History   Chief Complaint Chief Complaint  Patient presents with   Otalgia    X 2 weeks   Dysuria    HPI Daniel Bray is a 86 y.o. male presenting for discomfort of the left ear for 2 weeks.  Patient says it initially was causing him pain 2 weeks ago when he was congestion sneezing which she relates to allergies from cutting the grass and weed eating.  He says it got better after his wife instilled sweet oil in his ear.  Now he says that it "pops every now and then."  Denies any pain of the ear now and has not had any fevers.  He is no longer congested and does not report any sinus pain or cough.  No hearing issues or drainage from the ear.  Patient says he did stick a Q-tip in the ear recently to try to get some wax out.  Additionally patient reports urinary frequency and fatigue over the past 3 days.  He denies any associated fever, chills, dysuria, hematuria, urgency.  No abdominal or pelvic pain.  Denies flank pain.  No history of UTIs.  Patient says his blood sugars at home have been normal.  He is a diabetic and takes glipizide, Jardiance, and dulaglutide.  Last A1c was July of this year and it was 7.8.  In regards to the fatigue he denies any associated chest pain, palpitations, shortness of breath or dizziness.  No other complaints.  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   Diabetic polyneuropathy associated with type 2 diabetes mellitus ([place])    Ileus ([place]) 05/29/2020   Acute renal failure superimposed on stage 3a chronic kidney  disease ([place]) 05/28/2020   Nausea vomiting and diarrhea 05/28/2020   Abdominal pain 05/28/2020   Hyperkalemia 05/28/2020   Hypercalcemia 05/28/2020   Abnormal LFTs 05/28/2020   Elevated troponin 01/24/2020   Leukocytosis 01/24/2020   HLD (hyperlipidemia) 01/24/2020   GERD (gastroesophageal reflux disease) 01/24/2020   Anxiety 01/24/2020   CAD (coronary artery disease) 01/24/2020   Tobacco abuse 01/24/2020   Stage 3b chronic kidney disease ([place]) 01/24/2020   Bradycardia 01/24/2020   Laceration of forehead    Syncope 10/12/2019   Type 2 diabetes mellitus with hyperlipidemia ([place]) 10/12/2019   Accelerated hypertension 10/12/2019   History of renal cell carcinoma s/p nephrectomy 10/12/2019   SBO (small bowel obstruction) ([place]) 10/12/2019   Hypothyroidism 10/12/2019    Past Surgical History:  Procedure Laterality Date   CARDIAC ELECTROPHYSIOLOGY STUDY AND ABLATION     CARDIAC SURGERY     CORONARY ANGIOPLASTY WITH STENT PLACEMENT     CORONARY ARTERY BYPASS GRAFT     x3   SPLENECTOMY         Home Medications    Prior to Admission medications   Medication Sig Start Date End Date Taking? Authorizing Provider  acetaminophen (TYLENOL) 650 MG CR tablet  Take 650 mg by mouth 2 (two) times daily.   Yes [provider]  ALPRAZolam (XANAX) 0.25 MG tablet Take 0.125 mg by mouth at bedtime as needed for sleep.    Yes [provider]  aspirin EC 81 MG tablet Take 81 mg by mouth daily.   Yes [provider]  carvedilol (COREG) 6.25 MG tablet Take 6.25 mg by mouth 2 (two) times daily with a meal.   Yes [provider]  cholecalciferol (VITAMIN D) 1000 units tablet Take 2,000 Units by mouth daily.   Yes [provider]  DM-APAP-CPM (CORICIDIN HBP) 10-325-2 MG TABS Take 10 mg by mouth 2 (two) times daily as needed (cold symptoms).    Yes [provider]  Dulaglutide 3 MG/0.5ML SOPN Inject 3 mg into the skin once a week.   Yes [provider]  empagliflozin (JARDIANCE) 25 MG TABS tablet Take 25 mg by mouth daily.   Yes [provider]  gabapentin (NEURONTIN) 300 MG capsule Take 300 mg by mouth 2 (two) times daily.   Yes [provider]  glipiZIDE (GLUCOTROL) 10 MG tablet Take 10 mg by mouth 2 (two) times daily with a meal.    Yes [provider]  hydrALAZINE (APRESOLINE) 25 MG tablet Take 25 mg by mouth 2 (two) times daily.   Yes [provider]  levocetirizine (XYZAL) 5 MG tablet Take 5 mg by mouth daily. 11/27/20  Yes [provider]  levothyroxine (SYNTHROID) 75 MCG tablet Take 75 mcg by mouth daily before breakfast.    Yes [provider]  lisinopril (ZESTRIL) 20 MG tablet Take 20 mg by mouth daily.   Yes [provider]  omeprazole (PRILOSEC) 40 MG capsule Take 40 mg by mouth daily.    Yes [provider]  Pancrelipase, Lip-Prot-Amyl, (ZENPEP) 25000-79000 units CPEP Take 2 capsules by mouth See admin instructions. Take 2 capsules by mouth three times daily with meals and take 1 capsule twice daily with a snack   Yes [provider]  pyridoxine (B-6) 100 MG tablet Take 100 mg by mouth daily.   Yes [provider]  rosuvastatin (CRESTOR) 40 MG tablet Take 40 mg by mouth daily.   Yes [provider]  ondansetron (ZOFRAN ODT) 4 MG disintegrating tablet Take 1 tablet (4 mg total) by mouth every 8 (eight) hours as needed for nausea or vomiting. 12/06/20   Sreenath, Trula Slade, MD  senna-docusate (SENOKOT-S) 8.6-50 MG tablet Take 1 tablet by mouth 2 (two) times daily as needed for mild constipation or moderate constipation. 12/06/20   Sidney Ace, MD  Simethicone 80 MG TABS Take 1 tablet (80 mg total) by mouth 4 (four) times daily as needed. Patient taking differently: Take 1 tablet by mouth 4 (four) times daily as needed (gas). 05/30/20   Lavina Hamman, MD    Family History Family History  Problem Relation Age of Onset    Pancreatic cancer Mother    Liver cancer Mother     Social History Social History   Tobacco Use   Smoking status: Every Day    Packs/day: 1.00    Types: Cigars, Cigarettes   Smokeless tobacco: Current    Types: Chew  Vaping Use   Vaping Use: Never used  Substance Use Topics   Alcohol use: No   Drug use: No     Allergies   Levofloxacin, Levaquin [levofloxacin in d5w], and Januvia [sitagliptin]   Review of Systems Review of Systems  Constitutional:  Positive for fatigue. Negative for fever.  HENT:  Negative for congestion, ear discharge, ear pain and hearing loss.        L ear popping  Gastrointestinal:  Negative for abdominal pain and vomiting.  Genitourinary:  Positive for frequency. Negative for dysuria, flank pain, hematuria, testicular pain and urgency.  Neurological:  Negative for dizziness, weakness and headaches.    Physical Exam Triage Vital Signs ED Triage Vitals  Enc Vitals Group     BP 05/18/21 0934 94/62     Pulse Rate 05/18/21 0934 96     Resp 05/18/21 0934 16     Temp 05/18/21 0934 99 F (37.2 C)     Temp Source 05/18/21 0934 Oral     SpO2 05/18/21 0934 97 %     Weight 05/18/21 0930 146 lb (66.2 kg)     Height 05/18/21 0930 5\' 10"  (1.778 m)     Head Circumference --      Peak Flow --      Pain Score 05/18/21 0930 0     Pain Loc --      Pain Edu? --      Excl. in [place]? --    No data found.  Updated Vital Signs BP 94/62 (BP Location: Left Arm)   Pulse 96   Temp 99 F (37.2 C) (Oral)   Resp 16   Ht 5\' 10"  (1.778 m)   Wt 146 lb (66.2 kg)   SpO2 97%   BMI 20.95 kg/m      Physical Exam Vitals and nursing note reviewed.  Constitutional:      General: He is not in acute distress.    Appearance: Normal appearance. He is well-developed. He is not ill-appearing.  HENT:     Head: Normocephalic and atraumatic.     Right Ear: Tympanic membrane, ear canal and external ear normal.     Left Ear: Ear canal and external ear normal. There is  impacted cerumen (moderate amount of cerumen obstructing 50-60% of TM.).     Ears:     Comments: Small abrasion left EAC with dried blood Eyes:     Conjunctiva/sclera: Conjunctivae normal.  Cardiovascular:     Rate and Rhythm: Normal rate and regular rhythm.     Heart sounds: Normal heart sounds.  Pulmonary:     Effort: Pulmonary effort is normal. No respiratory distress.     Breath sounds: Normal breath sounds.  Abdominal:     Palpations: Abdomen is soft.     Tenderness: There is no abdominal tenderness. There is no right CVA tenderness or left CVA tenderness.  Musculoskeletal:     Cervical back: Neck supple.  Skin:    General: Skin is warm and dry.  Neurological:     General: No focal deficit present.     Mental Status: He is alert. Mental status is at baseline.     Motor: No weakness.     Coordination: Coordination normal.     Gait: Gait normal.  Psychiatric:        Mood and Affect: Mood normal.        Behavior: Behavior normal.        Thought Content: Thought content normal.     UC Treatments / Results  Labs (all labs ordered are listed, but only abnormal results are displayed) Labs Reviewed  URINALYSIS, COMPLETE (UACMP) WITH MICROSCOPIC - Abnormal; Notable for the following components:      Result Value   Glucose, UA 500 (*)  All other components within normal limits  GLUCOSE, CAPILLARY - Abnormal; Notable for the following components:   Glucose-Capillary 202 (*)    All other components within normal limits  CBG MONITORING, ED    EKG   Radiology No results found.  Procedures Procedures (including critical care time)  Medications Ordered in UC Medications - No data to display  Initial Impression / Assessment and Plan / UC Course  I have reviewed the triage vital signs and the nursing notes.  Pertinent labs & imaging results that were available during my care of the patient were reviewed by me and considered in my medical decision making (see chart for  details).  86 year old male presenting for 2 separate complaints.  First he says he has had discomfort of the left ear x2 weeks.  Initially had pain but now he has popping and occasional pressure in the ear.  On exam he does have cerumen obstructing 50 to 60% of TM and a small abrasion of the left EAC.  We will have nursing staff perform otic lavage to remove the cerumen and I can get a better look at the TM.  After otic lavage performed, small amount of fluid posterior to TM.  No injection or erythema or bulging of the TM.  No evidence of infection.  Popping and pressure likely related to the small effusion and eustachian tube dysfunction.  Advised to continue antihistamine and Flonase.  Follow-up for any worsening of symptoms with PCP or return to our department.  Second complaint is urinary frequency over the past 3 days and mild fatigue.  Denies dysuria, fever, chills, hematuria, flank pain or abdominal pain.  Urinalysis obtained today.  UA shows 500 glucose, otherwise normal.  Fingerstick today is 202.  Patient says it was 130 when he woke up this morning but he did have a sausage biscuit breakfast.  Advised patient no signs of an infection today and advised him to increase fluids and maintain a better diet to keep his blood sugars been checked.  Urinary frequency could be related to elevated blood sugars.  Advised following up with PCP if this is not improving over the next few days to week.  Thoroughly reviewed return and ED precautions if he develops any new or worsening symptoms associated with the fatigue and urinary symptoms.  Final Clinical Impressions(s) / UC Diagnoses   Final diagnoses:  Urinary frequency  Type 2 diabetes mellitus without complication, without long-term current use of insulin (HCC)  Abrasion of left ear canal, initial encounter  Dysfunction of left eustachian tube     Discharge Instructions      -There is no sign of UTI based on your urine.  He did have sugar in  your urine and your blood sugar was elevated at 200 today but you did just recently have a large breakfast with carbohydrates.  Keep an eye on your blood sugars.  Make sure to stay hydrated.  Follow-up with your PCP if urinary frequency is not getting better over the next week. -No sign of an ear infection.  As we discussed there is a small amount of fluid behind your eardrum which can cause some eustachian tube dysfunction resulting in popping and pressure of the ear.  It is great that it is no longer hurting.  He may continue to have symptoms over the next week or 2 as the fluid continues to dry up.  Continue with her allergy medication and nasal spray but if it becomes painful or you have  a fever you should return. -If your fatigue is not improving over the next few days or you develop new/worsening symptoms you should be seen again for another exam.      ED Prescriptions   None    PDMP not reviewed this encounter.   Danton Clap, PA-C 05/18/21 1034

## 2021-05-18 NOTE — ED Triage Notes (Signed)
Pt c/o left ear pain x 2 weeks, frequent urination x 3 days(no pain) used sweet oil for ear pain with no relief

## 2021-06-13 ENCOUNTER — Other Ambulatory Visit: Payer: Self-pay

## 2021-06-13 ENCOUNTER — Emergency Department
Admission: EM | Admit: 2021-06-13 | Discharge: 2021-06-13 | Disposition: A | Discharge status: Home / Self Care | Payer: Medicare HMO | Attending: Emergency Medicine | Admitting: Emergency Medicine

## 2021-06-13 DIAGNOSIS — F17210 Nicotine dependence, cigarettes, uncomplicated: Secondary | ICD-10-CM | POA: Diagnosis not present

## 2021-06-13 DIAGNOSIS — Z85038 Personal history of other malignant neoplasm of large intestine: Secondary | ICD-10-CM | POA: Diagnosis not present

## 2021-06-13 DIAGNOSIS — Z8507 Personal history of malignant neoplasm of pancreas: Secondary | ICD-10-CM | POA: Insufficient documentation

## 2021-06-13 DIAGNOSIS — Z7984 Long term (current) use of oral hypoglycemic drugs: Secondary | ICD-10-CM | POA: Insufficient documentation

## 2021-06-13 DIAGNOSIS — Z951 Presence of aortocoronary bypass graft: Secondary | ICD-10-CM | POA: Diagnosis not present

## 2021-06-13 DIAGNOSIS — N179 Acute kidney failure, unspecified: Secondary | ICD-10-CM | POA: Insufficient documentation

## 2021-06-13 DIAGNOSIS — Z23 Encounter for immunization: Secondary | ICD-10-CM | POA: Insufficient documentation

## 2021-06-13 DIAGNOSIS — R21 Rash and other nonspecific skin eruption: Secondary | ICD-10-CM | POA: Diagnosis present

## 2021-06-13 DIAGNOSIS — I2510 Atherosclerotic heart disease of native coronary artery without angina pectoris: Secondary | ICD-10-CM | POA: Insufficient documentation

## 2021-06-13 DIAGNOSIS — R233 Spontaneous ecchymoses: Secondary | ICD-10-CM

## 2021-06-13 DIAGNOSIS — E1122 Type 2 diabetes mellitus with diabetic chronic kidney disease: Secondary | ICD-10-CM | POA: Insufficient documentation

## 2021-06-13 DIAGNOSIS — L03116 Cellulitis of left lower limb: Secondary | ICD-10-CM | POA: Insufficient documentation

## 2021-06-13 DIAGNOSIS — E039 Hypothyroidism, unspecified: Secondary | ICD-10-CM | POA: Diagnosis not present

## 2021-06-13 DIAGNOSIS — Z79899 Other long term (current) drug therapy: Secondary | ICD-10-CM | POA: Diagnosis not present

## 2021-06-13 DIAGNOSIS — Z85528 Personal history of other malignant neoplasm of kidney: Secondary | ICD-10-CM | POA: Diagnosis not present

## 2021-06-13 DIAGNOSIS — I129 Hypertensive chronic kidney disease with stage 1 through stage 4 chronic kidney disease, or unspecified chronic kidney disease: Secondary | ICD-10-CM | POA: Insufficient documentation

## 2021-06-13 DIAGNOSIS — N1832 Chronic kidney disease, stage 3b: Secondary | ICD-10-CM | POA: Insufficient documentation

## 2021-06-13 DIAGNOSIS — Z7982 Long term (current) use of aspirin: Secondary | ICD-10-CM | POA: Diagnosis not present

## 2021-06-13 LAB — BASIC METABOLIC PANEL
Anion gap: 7 (ref 5–15)
BUN: 30 mg/dL — ABNORMAL HIGH (ref 8–23)
CO2: 23 mmol/L (ref 22–32)
Calcium: 9.8 mg/dL (ref 8.9–10.3)
Chloride: 104 mmol/L (ref 98–111)
Creatinine, Ser: 1.65 mg/dL — ABNORMAL HIGH (ref 0.61–1.24)
GFR, Estimated: 41 mL/min — ABNORMAL LOW (ref >60)
Glucose, Bld: 180 mg/dL — ABNORMAL HIGH (ref 70–99)
Potassium: 4.4 mmol/L (ref 3.5–5.1)
Sodium: 134 mmol/L — ABNORMAL LOW (ref 135–145)

## 2021-06-13 LAB — CBC
HCT: 41.6 % (ref 39.0–52.0)
Hemoglobin: 12.9 g/dL — ABNORMAL LOW (ref 13.0–17.0)
MCH: 32.3 pg (ref 26.0–34.0)
MCHC: 31 g/dL (ref 30.0–36.0)
MCV: 104 fL — ABNORMAL HIGH (ref 80.0–100.0)
Platelets: 193 10*3/uL (ref 150–400)
RBC: 4 MIL/uL — ABNORMAL LOW (ref 4.22–5.81)
RDW: 13.2 % (ref 11.5–15.5)
WBC: 14.1 10*3/uL — ABNORMAL HIGH (ref 4.0–10.5)
nRBC: 0 % (ref 0.0–0.2)

## 2021-06-13 MED ORDER — TETANUS-DIPHTH-ACELL PERTUSSIS 5-2.5-18.5 LF-MCG/0.5 IM SUSY
0.5000 mL | PREFILLED_SYRINGE | Freq: Once | INTRAMUSCULAR | Status: AC
  Administered 2021-06-13: 0.5 mL via INTRAMUSCULAR
  Filled 2021-06-13: qty 0.5

## 2021-06-13 MED ORDER — DOXYCYCLINE HYCLATE 100 MG PO CAPS: 100.0000 mg | ORAL_CAPSULE | Freq: Two times a day (BID) | ORAL | 0 refills | Status: AC

## 2021-06-13 MED ORDER — DOXYCYCLINE HYCLATE 100 MG PO TABS
100.0000 mg | ORAL_TABLET | Freq: Once | ORAL | Status: AC
  Administered 2021-06-13: 100 mg via ORAL
  Filled 2021-06-13: qty 1

## 2021-06-13 NOTE — ED Triage Notes (Signed)
Pt to ED for rash to left leg and right arm x2 weeks and wound to left leg x2 weeks after bumping it on wheel barrow. Pt diabetic.

## 2021-06-13 NOTE — ED Provider Notes (Signed)
[facility] Emergency Department Provider Note  ____________________________________________   Event Date/Time   First MD Initiated Contact with Patient 06/13/21 1351     (approximate)  I have reviewed the triage vital signs and the nursing notes.   HISTORY  Chief Complaint Wound Check and Rash   HPI Daniel Bray is a 86 y.o. male with medical history significant for HTN, DM, hypothyroidism, CAD status post CABG, CKD lllb, history of renal cell, colon pancreatic cancer status post nephrectomy, subtotal colectomy and chemotherapy currently under surveillance who presents for assessment after seemingly separate issues.  First patient states that about 2 weeks ago he bumped his left lower leg against a wheelbarrow.  States he was seen by his PCP and did not feel it was infected at that time and was told to cover it with some bandages but feels that he has had some redness develop around it is not getting better.  He denies any subsequent injuries or falls, fevers or pain in ankle or knee.  He also states that yesterday he noticed some red spots on the inside of his right knee.  These do not itch and are not painful.  He has not had any other areas of red spots or similar rash.  He denies any headache, earache, sore throat, chest pain, cough, shortness of breath, earache, vomiting, diarrhea, burning with urination, blood in his urine or any other acute concerns at this time.  He states he has essentially no pain except when he bears weight and is very minimal.         Past Medical History:  Diagnosis Date   Cancer ([place])    kidney   Cancer ([place])    pancreas   Cancer ([place])    colon    Diabetes mellitus without complication ([place])    Hyperlipidemia    Hypertension    Myocardial infarction ([place])    Streptococcal infection    Strep Bovis   Thyroid disease     Patient Active Problem List   Diagnosis Date Noted   Hiccups    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    Diabetic polyneuropathy associated with type 2 diabetes mellitus ([place])    Ileus ([place]) 05/29/2020   Acute renal failure superimposed on stage 3a chronic kidney disease ([place]) 05/28/2020   Nausea vomiting and diarrhea 05/28/2020   Abdominal pain 05/28/2020   Hyperkalemia 05/28/2020   Hypercalcemia 05/28/2020   Abnormal LFTs 05/28/2020   Elevated troponin 01/24/2020   Leukocytosis 01/24/2020   HLD (hyperlipidemia) 01/24/2020   GERD (gastroesophageal reflux disease) 01/24/2020   Anxiety 01/24/2020   CAD (coronary artery disease) 01/24/2020   Tobacco abuse 01/24/2020   Stage 3b chronic kidney disease ([place]) 01/24/2020   Bradycardia 01/24/2020   Laceration of forehead    Syncope 10/12/2019   Type 2 diabetes mellitus with hyperlipidemia ([place]) 10/12/2019   Accelerated hypertension 10/12/2019   History of renal cell carcinoma s/p nephrectomy 10/12/2019   SBO (small bowel obstruction) ([place]) 10/12/2019   Hypothyroidism 10/12/2019    Past Surgical History:  Procedure Laterality Date   CARDIAC ELECTROPHYSIOLOGY STUDY AND ABLATION     CARDIAC SURGERY     CORONARY ANGIOPLASTY WITH STENT PLACEMENT     CORONARY ARTERY BYPASS GRAFT     x3   SPLENECTOMY      Prior to Admission medications   Medication Sig Start Date End Date Taking? Authorizing Provider  doxycycline (VIBRAMYCIN) 100 MG capsule Take 1 capsule (100 mg total) by mouth  2 (two) times daily for 7 days. 06/13/21 06/20/21 Yes Lucrezia Starch, MD  acetaminophen (TYLENOL) 650 MG CR tablet Take 650 mg by mouth 2 (two) times daily.    [provider]  ALPRAZolam Duanne Moron) 0.25 MG tablet Take 0.125 mg by mouth at bedtime as needed for sleep.     [provider]  aspirin EC 81 MG tablet Take 81 mg by mouth daily.    [provider]  carvedilol (COREG) 6.25 MG tablet Take 6.25 mg by mouth 2 (two) times daily with a meal.    [provider]  cholecalciferol (VITAMIN D) 1000 units tablet Take 2,000 Units by  mouth daily.    [provider]  DM-APAP-CPM (CORICIDIN HBP) 10-325-2 MG TABS Take 10 mg by mouth 2 (two) times daily as needed (cold symptoms).     [provider]  Dulaglutide 3 MG/0.5ML SOPN Inject 3 mg into the skin once a week.    [provider]  empagliflozin (JARDIANCE) 25 MG TABS tablet Take 25 mg by mouth daily.    [provider]  gabapentin (NEURONTIN) 300 MG capsule Take 300 mg by mouth 2 (two) times daily.    [provider]  glipiZIDE (GLUCOTROL) 10 MG tablet Take 10 mg by mouth 2 (two) times daily with a meal.     [provider]  hydrALAZINE (APRESOLINE) 25 MG tablet Take 25 mg by mouth 2 (two) times daily.    [provider]  levocetirizine (XYZAL) 5 MG tablet Take 5 mg by mouth daily. 11/27/20   [provider]  levothyroxine (SYNTHROID) 75 MCG tablet Take 75 mcg by mouth daily before breakfast.     [provider]  lisinopril (ZESTRIL) 20 MG tablet Take 20 mg by mouth daily.    [provider]  omeprazole (PRILOSEC) 40 MG capsule Take 40 mg by mouth daily.     [provider]  ondansetron (ZOFRAN ODT) 4 MG disintegrating tablet Take 1 tablet (4 mg total) by mouth every 8 (eight) hours as needed for nausea or vomiting. 12/06/20   Sreenath, Sudheer B, MD  Pancrelipase, Lip-Prot-Amyl, (ZENPEP) 25000-79000 units CPEP Take 2 capsules by mouth See admin instructions. Take 2 capsules by mouth three times daily with meals and take 1 capsule twice daily with a snack    [provider]  pyridoxine (B-6) 100 MG tablet Take 100 mg by mouth daily.    [provider]  rosuvastatin (CRESTOR) 40 MG tablet Take 40 mg by mouth daily.    [provider]  senna-docusate (SENOKOT-S) 8.6-50 MG tablet Take 1 tablet by mouth 2 (two) times daily as needed for mild constipation or moderate constipation. 12/06/20   Sidney Ace, MD  Simethicone 80 MG TABS Take 1 tablet (80 mg  total) by mouth 4 (four) times daily as needed. Patient taking differently: Take 1 tablet by mouth 4 (four) times daily as needed (gas). 05/30/20   Lavina Hamman, MD    Allergies Levofloxacin, Levaquin [levofloxacin in d5w], and Januvia [sitagliptin]  Family History  Problem Relation Age of Onset   Pancreatic cancer Mother    Liver cancer Mother     Social History Social History   Tobacco Use   Smoking status: Every Day    Packs/day: 1.00    Types: Cigars, Cigarettes   Smokeless tobacco: Current    Types: Chew  Vaping Use   Vaping Use: Never used  Substance Use Topics   Alcohol use:  No   Drug use: No    Review of Systems  Review of Systems  Constitutional:  Negative for chills and fever.  HENT:  Negative for sore throat.   Eyes:  Negative for pain.  Respiratory:  Negative for cough and stridor.   Cardiovascular:  Negative for chest pain.  Gastrointestinal:  Negative for vomiting.  Genitourinary:  Negative for dysuria.  Musculoskeletal:  Positive for myalgias (L lower leg).  Skin:  Positive for rash.  Neurological:  Negative for seizures, loss of consciousness and headaches.  Psychiatric/Behavioral:  Negative for suicidal ideas.   All other systems reviewed and are negative.    ____________________________________________   PHYSICAL EXAM:  VITAL SIGNS: ED Triage Vitals  Enc Vitals Group     BP 06/13/21 1317 135/79     Pulse Rate 06/13/21 1317 72     Resp 06/13/21 1314 20     Temp 06/13/21 1314 (!) 97.5 F (36.4 C)     Temp Source 06/13/21 1314 Oral     SpO2 06/13/21 1317 100 %     Weight 06/13/21 1315 149 lb (67.6 kg)     Height 06/13/21 1315 5\' 10"  (1.778 m)     Head Circumference --      Peak Flow --      Pain Score 06/13/21 1315 6     Pain Loc --      Pain Edu? --      Excl. in [place]? --    Vitals:   06/13/21 1314 06/13/21 1317  BP:  135/79  Pulse:  72  Resp: 20   Temp: (!) 97.5 F (36.4 C)   SpO2:  100%   Physical Exam Vitals and  nursing note reviewed.  Constitutional:      Appearance: He is well-developed.  HENT:     Head: Normocephalic and atraumatic.     Right Ear: External ear normal.     Left Ear: External ear normal.     Nose: Nose normal.  Eyes:     Conjunctiva/sclera: Conjunctivae normal.  Cardiovascular:     Rate and Rhythm: Normal rate and regular rhythm.     Heart sounds: No murmur heard. Pulmonary:     Effort: Pulmonary effort is normal. No respiratory distress.     Breath sounds: Normal breath sounds.  Abdominal:     Palpations: Abdomen is soft.     Tenderness: There is no abdominal tenderness.  Musculoskeletal:     Cervical back: Neck supple.  Skin:    General: Skin is warm and dry.  Neurological:     Mental Status: He is alert and oriented to person, place, and time.  Psychiatric:        Mood and Affect: Mood normal.    Patient had appears to be a skin tear versus a very superficial ulceration approximately 2 x 4 cm on the anterior aspect of the left lower leg just proximal to the ankle.  There is some surrounding erythema warmth and tenderness mild edema.  There is no significant induration or involvement of the ankle itself.  2+ DP pulses bilaterally and sensation is intact to light touch of the bilateral extremities.  Patient is full strength and range of motion throughout his bilateral lower extremities.  There is also a few red spots that appear petechial medial aspect of the right knee which patient states started yesterday.  These are nonblanchable but there are no surrounding skin changes.  No other areas of redness or rashes noted. ____________________________________________  LABS (all labs ordered are listed, but only abnormal results are displayed)  Labs Reviewed  CBC - Abnormal; Notable for the following components:      Result Value   WBC 14.1 (*)    RBC 4.00 (*)    Hemoglobin 12.9 (*)    MCV 104.0 (*)    All other components within normal limits  BASIC METABOLIC PANEL -  Abnormal; Notable for the following components:   Sodium 134 (*)    Glucose, Bld 180 (*)    BUN 30 (*)    Creatinine, Ser 1.65 (*)    GFR, Estimated 41 (*)    All other components within normal limits   ____________________________________________  EKG  ____________________________________________  RADIOLOGY  ED MD interpretation:    Official radiology report(s): No results found.  ____________________________________________   PROCEDURES  Procedure(s) performed (including Critical Care):  Procedures   ____________________________________________   INITIAL IMPRESSION / ASSESSMENT AND PLAN / ED COURSE      Patient presents with above-stated history exam for assessment of 2 skin issues.  On arrival he is afebrile and hemodynamically stable.  First he is resting wound check of his left lower leg which he injured 2 weeks ago when he struck it against a wheelbarrow.  He noticed some redness spreading around the initial area of ulceration.  He also states that yesterday evening early this morning he noticed a red spot on his right knee.  He notes he has been out in the garden but does not recall any tick bites denies any other associated sick symptoms or constitutional symptoms.  With regard to the left lower extremity slightly.  Patient may have developed cellulitis.  There is no involvement of the ankle joint to suggest a septic joint and given redness is fairly well-circumscribed about the ulcerated lesion I have a lower suspicion for DVT as there is no significant circumferential edema.  He is otherwise neurovascular intact have a very low sufficient for underlying fracture, compartment syndrome or necrotizing infection.  We will start patient on doxycycline.  Tetanus updated.  With regard to the [place] rash on the right knee and unclear the etiology of this at this time.  He has no fever or systemic symptoms or other findings to suggest meningococcal infection or disseminated  gonorrhea infection.  His platelets are normal and not suggestive of ITP, DIC or purpura fulminans.  His white count is slightly elevated at 14.1 possibly related to cellulitis and somewhat nonspecific in this circumstance.  BMP remarkable for glucose of 180 and a creatinine of 1.65 compared to 1.286 months ago.  However it seems that 6 months ago it had initially been as high as 1.63.  Patient has not had any gross hematuria decreased urine output or other concerning features for an acute nephritic syndrome.  Doxycycline prescribed for the left lower extremity cellulitis will cover for possible RMSF and other tickborne illnesses.  Given stable vitals with low suspicion for sepsis or bacteremia and normal platelets I think he is stable for discharge with close outpatient PCP follow-up.  Advised patient to drink plenty of fluids in the interim and have his kidney function rechecked in a couple days as well.  Patient voiced understanding and agreement this plan.  Discharged stable condition.  Strict return precautions advised and discussed   ____________________________________________   FINAL CLINICAL IMPRESSION(S) / ED DIAGNOSES  Final diagnoses:  Left leg cellulitis  Petechial rash  AKI (acute kidney injury) ([place])    Medications  Tdap (  BOOSTRIX) injection 0.5 mL (0.5 mLs Intramuscular Given 06/13/21 1506)  doxycycline (VIBRA-TABS) tablet 100 mg (100 mg Oral Given 06/13/21 1442)     ED Discharge Orders          Ordered    doxycycline (VIBRAMYCIN) 100 MG capsule  2 times daily        06/13/21 1508             Note:  This document was prepared using Dragon voice recognition software and may include unintentional dictation errors.    Lucrezia Starch, MD 06/13/21 [phone]

## 2021-08-03 ENCOUNTER — Ambulatory Visit
Admission: RE | Admit: 2021-08-03 | Discharge: 2021-08-03 | Disposition: A | Discharge status: Home / Self Care | Payer: Medicare HMO | Attending: Cardiology | Admitting: Cardiology

## 2021-08-03 ENCOUNTER — Encounter: Payer: Self-pay | Admitting: Cardiology

## 2021-08-03 ENCOUNTER — Encounter
Admission: RE | Disposition: A | Discharge status: Home / Self Care | Payer: Self-pay | Source: Home / Self Care | Attending: Cardiology

## 2021-08-03 DIAGNOSIS — I5020 Unspecified systolic (congestive) heart failure: Secondary | ICD-10-CM | POA: Insufficient documentation

## 2021-08-03 DIAGNOSIS — Z951 Presence of aortocoronary bypass graft: Secondary | ICD-10-CM | POA: Diagnosis not present

## 2021-08-03 DIAGNOSIS — I2510 Atherosclerotic heart disease of native coronary artery without angina pectoris: Secondary | ICD-10-CM | POA: Diagnosis present

## 2021-08-03 DIAGNOSIS — I110 Hypertensive heart disease with heart failure: Secondary | ICD-10-CM | POA: Insufficient documentation

## 2021-08-03 DIAGNOSIS — R9430 Abnormal result of cardiovascular function study, unspecified: Secondary | ICD-10-CM

## 2021-08-03 DIAGNOSIS — I2582 Chronic total occlusion of coronary artery: Secondary | ICD-10-CM | POA: Diagnosis not present

## 2021-08-03 HISTORY — PX: LEFT HEART CATH AND CORONARY ANGIOGRAPHY: CATH118249

## 2021-08-03 LAB — GLUCOSE, CAPILLARY: Glucose-Capillary: 125 mg/dL — ABNORMAL HIGH (ref 70–99)

## 2021-08-03 SURGERY — LEFT HEART CATH AND CORONARY ANGIOGRAPHY
Anesthesia: Moderate Sedation

## 2021-08-03 MED ORDER — ONDANSETRON HCL 4 MG/2ML IJ SOLN: 4.0000 mg | Freq: Four times a day (QID) | INTRAMUSCULAR | Status: DC | PRN

## 2021-08-03 MED ORDER — LIDOCAINE HCL (PF) 1 % IJ SOLN
INTRAMUSCULAR | Status: DC | PRN
  Administered 2021-08-03: 2 mL

## 2021-08-03 MED ORDER — SODIUM CHLORIDE 0.9% FLUSH: 3.0000 mL | Freq: Two times a day (BID) | INTRAVENOUS | Status: DC

## 2021-08-03 MED ORDER — SODIUM CHLORIDE 0.9 % IV SOLN
INTRAVENOUS | Status: DC | PRN
  Administered 2021-08-03: 100 mL/h via INTRAVENOUS

## 2021-08-03 MED ORDER — FENTANYL CITRATE (PF) 100 MCG/2ML IJ SOLN
INTRAMUSCULAR | Status: DC | PRN
  Administered 2021-08-03: 25 ug via INTRAVENOUS

## 2021-08-03 MED ORDER — SODIUM CHLORIDE 0.9 % IV SOLN: 250.0000 mL | INTRAVENOUS | Status: DC | PRN

## 2021-08-03 MED ORDER — SODIUM CHLORIDE 0.9% FLUSH: 3.0000 mL | INTRAVENOUS | Status: DC | PRN

## 2021-08-03 MED ORDER — HEPARIN (PORCINE) IN NACL 1000-0.9 UT/500ML-% IV SOLN
INTRAVENOUS | Status: AC
  Filled 2021-08-03: qty 1000

## 2021-08-03 MED ORDER — HEPARIN (PORCINE) IN NACL 1000-0.9 UT/500ML-% IV SOLN
INTRAVENOUS | Status: DC | PRN
  Administered 2021-08-03 (×2): 500 mL

## 2021-08-03 MED ORDER — FENTANYL CITRATE (PF) 100 MCG/2ML IJ SOLN
INTRAMUSCULAR | Status: AC
  Filled 2021-08-03: qty 2

## 2021-08-03 MED ORDER — MIDAZOLAM HCL 2 MG/2ML IJ SOLN
INTRAMUSCULAR | Status: DC | PRN
  Administered 2021-08-03: 1 mg via INTRAVENOUS

## 2021-08-03 MED ORDER — VERAPAMIL HCL 2.5 MG/ML IV SOLN
INTRAVENOUS | Status: AC
  Filled 2021-08-03: qty 2

## 2021-08-03 MED ORDER — ASPIRIN 81 MG PO CHEW
324.0000 mg | CHEWABLE_TABLET | ORAL | Status: AC
  Administered 2021-08-03: 07:00:00 243 mg via ORAL

## 2021-08-03 MED ORDER — HEPARIN SODIUM (PORCINE) 1000 UNIT/ML IJ SOLN
INTRAMUSCULAR | Status: DC | PRN
  Administered 2021-08-03: 3500 [IU] via INTRAVENOUS

## 2021-08-03 MED ORDER — HEPARIN SODIUM (PORCINE) 1000 UNIT/ML IJ SOLN
INTRAMUSCULAR | Status: AC
  Filled 2021-08-03: qty 10

## 2021-08-03 MED ORDER — ACETAMINOPHEN 325 MG PO TABS: 650.0000 mg | ORAL_TABLET | ORAL | Status: DC | PRN

## 2021-08-03 MED ORDER — ASPIRIN 81 MG PO CHEW
CHEWABLE_TABLET | ORAL | Status: AC
  Filled 2021-08-03: qty 3

## 2021-08-03 MED ORDER — MIDAZOLAM HCL 2 MG/2ML IJ SOLN
INTRAMUSCULAR | Status: AC
  Filled 2021-08-03: qty 2

## 2021-08-03 MED ORDER — IOHEXOL 300 MG/ML  SOLN
INTRAMUSCULAR | Status: DC | PRN
  Administered 2021-08-03: 08:00:00 39 mL

## 2021-08-03 MED ORDER — VERAPAMIL HCL 2.5 MG/ML IV SOLN
INTRAVENOUS | Status: DC | PRN
  Administered 2021-08-03: 2.5 mg via INTRAVENOUS

## 2021-08-03 MED ORDER — LIDOCAINE HCL 1 % IJ SOLN
INTRAMUSCULAR | Status: AC
  Filled 2021-08-03: qty 20

## 2021-08-03 MED ORDER — SODIUM CHLORIDE 0.9 % IV SOLN
INTRAVENOUS | Status: DC
  Administered 2021-08-03: 07:00:00 1000 mL via INTRAVENOUS

## 2021-08-03 MED ORDER — SODIUM CHLORIDE 0.9 % IV SOLN: INTRAVENOUS | Status: DC

## 2021-08-03 SURGICAL SUPPLY — 12 items
CATH 5FR JL3.5 JR4 ANG PIG MP (CATHETERS) ×1 IMPLANT
DEVICE RAD TR BAND REGULAR (VASCULAR PRODUCTS) ×1 IMPLANT
DRAPE BRACHIAL (DRAPES) ×1 IMPLANT
GLIDESHEATH SLEND SS 6F .021 (SHEATH) ×1 IMPLANT
GUIDEWIRE INQWIRE 1.5J.035X260 (WIRE) IMPLANT
INQWIRE 1.5J .035X260CM (WIRE) ×2
KIT SYRINGE INJ CVI SPIKEX1 (MISCELLANEOUS) ×1 IMPLANT
PACK CARDIAC CATH (CUSTOM PROCEDURE TRAY) ×2 IMPLANT
PROTECTION STATION PRESSURIZED (MISCELLANEOUS) ×2
SET ATX SIMPLICITY (MISCELLANEOUS) ×1 IMPLANT
STATION PROTECTION PRESSURIZED (MISCELLANEOUS) IMPLANT
WIRE HITORQ VERSACORE ST 145CM (WIRE) ×1 IMPLANT

## 2021-08-03 NOTE — H&P (Signed)
[facility] Cardiology History and Physical  Patient ID: Daniel Bray MRN: [id number] DOB/AGE: [birth year] 86 y.o. Admit date: 08/03/2021  Primary Care Physician: Elza Rafter, MD Primary Cardiologist Andrez Grime, MD  HPI:  Daniel Bray is a 86 year old male with a history of ischemic cardiomyopathy (EF 35 to 40%), VT/VF s/p ICD and ablation, CAD s/p CABG (LIMA to LAD, SVG to OM, SVG to D1) who was been experiencing worsening shortness of breath recently.  Additionally his EF on most recent echocardiogram showed an EF of 35% which was decreased from recently at 50%.  Tress test was subsequently completed which showed inferior scar.  He is referred for heart catheterization for further evaluation.    Past Medical History:  Diagnosis Date   Cancer ([place])    kidney   Cancer ([place])    pancreas   Cancer ([place])    colon    Diabetes mellitus without complication ([place])    Hyperlipidemia    Hypertension    Myocardial infarction ([place])    Streptococcal infection    Strep Bovis   Thyroid disease     Past Surgical History:  Procedure Laterality Date   CARDIAC ELECTROPHYSIOLOGY STUDY AND ABLATION     CARDIAC SURGERY     CORONARY ANGIOPLASTY WITH STENT PLACEMENT     CORONARY ARTERY BYPASS GRAFT     x3   PANCREAS SURGERY     SPLENECTOMY      Medications Prior to Admission  Medication Sig Dispense Refill Last Dose   acetaminophen (TYLENOL) 650 MG CR tablet Take 1,300 mg by mouth every 8 (eight) hours as needed for pain.      ALPRAZolam (XANAX) 0.25 MG tablet Take 0.125 mg by mouth at bedtime as needed for sleep.    Past Week   aspirin EC 81 MG tablet Take 81 mg by mouth daily.   08/03/2021   Calcium Carbonate-Simethicone (ALKA-SELTZER HEARTBURN + GAS) 750-80 MG CHEW Chew 2 each by mouth daily as needed (gas/heartburn).      carvedilol (COREG) 6.25 MG tablet Take 6.25 mg by mouth 2 (two) times daily with a meal.   08/02/2021   cholecalciferol (VITAMIN D) 1000 units  tablet Take 1,000 Units by mouth daily.   08/02/2021   diclofenac Sodium (VOLTAREN) 1 % GEL Apply 1 application topically daily as needed (pain).      Dulaglutide 3 MG/0.5ML SOPN Inject 3 mg into the skin every Thursday.   08/02/2021   empagliflozin (JARDIANCE) 25 MG TABS tablet Take 25 mg by mouth daily.   08/02/2021   gabapentin (NEURONTIN) 300 MG capsule Take 300 mg by mouth at bedtime.   08/02/2021   glipiZIDE (GLUCOTROL) 10 MG tablet Take 10 mg by mouth 2 (two) times daily with a meal.    08/02/2021   hydrALAZINE (APRESOLINE) 25 MG tablet Take 25 mg by mouth 2 (two) times daily.   08/02/2021   Inulin (FIBER CHOICE PO) Take 5 tablets by mouth with breakfast, with lunch, and with evening meal.      levocetirizine (XYZAL) 5 MG tablet Take 5 mg by mouth every evening.   08/02/2021   levothyroxine (SYNTHROID) 75 MCG tablet Take 75 mcg by mouth daily before breakfast.    08/02/2021   lipase/protease/amylase (CREON) 36000 UNITS CPEP capsule 72,000 Units 3 (three) times daily with meals.   08/02/2021   Melatonin 10 MG TABS Take 10 mg by mouth at bedtime as needed (sleep).      Multiple Vitamins-Minerals (PRESERVISION AREDS 2+MULTI VIT  PO) Take 1 capsule by mouth in the morning and at bedtime.   08/02/2021   omeprazole (PRILOSEC) 40 MG capsule Take 40 mg by mouth daily.    08/02/2021   oxymetazoline (AFRIN) 0.05 % nasal spray Place 1 spray into both nostrils at bedtime.      pyridoxine (B-6) 100 MG tablet Take 100 mg by mouth daily.   08/02/2021   rosuvastatin (CRESTOR) 40 MG tablet Take 40 mg by mouth daily.   08/02/2021   Social History   Socioeconomic History   Marital status: Married    Spouse name: Not on file   Number of children: Not on file   Years of education: Not on file   Highest education level: Not on file  Occupational History   Not on file  Tobacco Use   Smoking status: Every Day    Types: Cigars   Smokeless tobacco: Current    Types: Chew  Vaping Use   Vaping Use: Never  used  Substance and Sexual Activity   Alcohol use: No   Drug use: No   Sexual activity: Not on file  Other Topics Concern   Not on file  Social History Narrative   Not on file   Social Determinants of Health   Financial Resource Strain: Not on file  Food Insecurity: Not on file  Transportation Needs: Not on file  Physical Activity: Not on file  Stress: Not on file  Social Connections: Not on file  Intimate Partner Violence: Not on file    Family History  Problem Relation Age of Onset   Pancreatic cancer Mother    Liver cancer Mother       Review of systems complete and found to be negative unless listed above    Physical Exam:  General: Well developed, well nourished, in no acute distress HEENT:  Normocephalic and atramatic Neck:  No JVD.  Lungs: Clear bilaterally to auscultation and percussion. Heart: HRRR . Normal S1 and S2 without gallops or murmurs.  Abdomen: Bowel sounds are positive, abdomen soft and non-tender  Msk:  Back normal, normal gait. Normal strength and tone for age. Extremities: No clubbing, cyanosis or edema.   Neuro: Alert and oriented X 3. Psych:  Good affect, responds appropriately  Labs:   Lab Results  Component Value Date   WBC 14.1 (H) 06/13/2021   HGB 12.9 (L) 06/13/2021   HCT 41.6 06/13/2021   MCV 104.0 (H) 06/13/2021   PLT 193 06/13/2021   No results for input(s): NA, K, CL, CO2, BUN, CREATININE, CALCIUM, PROT, BILITOT, ALKPHOS, ALT, AST, GLUCOSE in the last 168 hours.  Invalid input(s): LABALBU Lab Results  Component Value Date   CKTOTAL 33 (L) 02/07/2015   TROPONINI 0.05 (HH) 10/03/2016    Lab Results  Component Value Date   CHOL 75 05/29/2020   CHOL 90 01/25/2020   Lab Results  Component Value Date   HDL 42 05/29/2020   HDL 36 (L) 01/25/2020   Lab Results  Component Value Date   LDLCALC 13 05/29/2020   LDLCALC 27 01/25/2020   Lab Results  Component Value Date   TRIG 101 05/29/2020   TRIG 134 01/25/2020   Lab  Results  Component Value Date   CHOLHDL 1.8 05/29/2020   CHOLHDL 2.5 01/25/2020   No results found for: LDLDIRECT    Radiology: No results found.  EKG: NSR. LVH with repolarization  ASSESSMENT AND PLAN:  86 year old male with history of ischemic cardiomyopathy, CAD s/p CABG who was  been experiencing worsening shortness of breath.  As part of his evaluation his EF has decreased from 50% to 35%, and a stress test completed showed inferior scar.  For further evaluation of this we mutually decided to proceed with heart catheterization.  The risks and benefits were discussed with the patient at length and he is agreeable to proceed.  Signed: Andrez Grime MD 08/03/2021, 7:28 AM

## 2021-08-07 ENCOUNTER — Encounter: Payer: Self-pay | Admitting: Cardiology

## 2021-10-25 ENCOUNTER — Ambulatory Visit
Admission: RE | Admit: 2021-10-25 | Discharge: 2021-10-25 | Disposition: A | Discharge status: Home / Self Care | Payer: Medicare HMO | Source: Ambulatory Visit | Attending: Specialist | Admitting: Specialist

## 2021-10-25 ENCOUNTER — Other Ambulatory Visit: Payer: Self-pay

## 2021-10-25 ENCOUNTER — Other Ambulatory Visit: Payer: Self-pay | Admitting: Specialist

## 2021-10-25 ENCOUNTER — Other Ambulatory Visit
Admission: RE | Admit: 2021-10-25 | Discharge: 2021-10-25 | Disposition: A | Discharge status: Home / Self Care | Payer: Medicare HMO | Source: Ambulatory Visit | Attending: Specialist | Admitting: Specialist

## 2021-10-25 ENCOUNTER — Other Ambulatory Visit (HOSPITAL_COMMUNITY): Payer: Self-pay | Admitting: Specialist

## 2021-10-25 DIAGNOSIS — R0789 Other chest pain: Secondary | ICD-10-CM | POA: Diagnosis present

## 2021-10-25 DIAGNOSIS — R7989 Other specified abnormal findings of blood chemistry: Secondary | ICD-10-CM

## 2021-10-25 DIAGNOSIS — R0609 Other forms of dyspnea: Secondary | ICD-10-CM | POA: Insufficient documentation

## 2021-10-25 LAB — D-DIMER, QUANTITATIVE: D-Dimer, Quant: 0.69 ug/mL-FEU — ABNORMAL HIGH (ref 0.00–0.50)

## 2021-10-25 LAB — POCT I-STAT CREATININE: Creatinine, Ser: 1.8 mg/dL — ABNORMAL HIGH (ref 0.61–1.24)

## 2021-10-25 MED ORDER — IOHEXOL 350 MG/ML SOLN
75.0000 mL | Freq: Once | INTRAVENOUS | Status: AC | PRN
  Administered 2021-10-25: 75 mL via INTRAVENOUS

## 2021-11-08 ENCOUNTER — Other Ambulatory Visit: Payer: Self-pay | Admitting: Specialist

## 2021-11-08 DIAGNOSIS — R0609 Other forms of dyspnea: Secondary | ICD-10-CM

## 2021-11-08 DIAGNOSIS — J986 Disorders of diaphragm: Secondary | ICD-10-CM

## 2021-11-16 ENCOUNTER — Ambulatory Visit
Admission: RE | Admit: 2021-11-16 | Discharge: 2021-11-16 | Disposition: A | Discharge status: Home / Self Care | Payer: Medicare HMO | Source: Ambulatory Visit | Attending: Specialist | Admitting: Specialist

## 2021-11-16 DIAGNOSIS — R0609 Other forms of dyspnea: Secondary | ICD-10-CM | POA: Diagnosis present

## 2021-11-16 DIAGNOSIS — J986 Disorders of diaphragm: Secondary | ICD-10-CM | POA: Insufficient documentation

## 2021-11-23 ENCOUNTER — Observation Stay: Payer: Medicare HMO

## 2021-11-23 ENCOUNTER — Emergency Department: Payer: Medicare HMO

## 2021-11-23 ENCOUNTER — Observation Stay
Admission: EM | Admit: 2021-11-23 | Discharge: 2021-11-24 | Disposition: A | Discharge status: Home / Self Care | Payer: Medicare HMO | Attending: Internal Medicine | Admitting: Internal Medicine

## 2021-11-23 DIAGNOSIS — R0602 Shortness of breath: Secondary | ICD-10-CM | POA: Diagnosis present

## 2021-11-23 DIAGNOSIS — F419 Anxiety disorder, unspecified: Secondary | ICD-10-CM | POA: Diagnosis present

## 2021-11-23 DIAGNOSIS — E039 Hypothyroidism, unspecified: Secondary | ICD-10-CM | POA: Diagnosis not present

## 2021-11-23 DIAGNOSIS — R778 Other specified abnormalities of plasma proteins: Secondary | ICD-10-CM | POA: Diagnosis present

## 2021-11-23 DIAGNOSIS — E1122 Type 2 diabetes mellitus with diabetic chronic kidney disease: Secondary | ICD-10-CM | POA: Diagnosis not present

## 2021-11-23 DIAGNOSIS — Z951 Presence of aortocoronary bypass graft: Secondary | ICD-10-CM | POA: Insufficient documentation

## 2021-11-23 DIAGNOSIS — Z7982 Long term (current) use of aspirin: Secondary | ICD-10-CM | POA: Diagnosis not present

## 2021-11-23 DIAGNOSIS — E785 Hyperlipidemia, unspecified: Secondary | ICD-10-CM | POA: Diagnosis present

## 2021-11-23 DIAGNOSIS — Z79899 Other long term (current) drug therapy: Secondary | ICD-10-CM | POA: Diagnosis not present

## 2021-11-23 DIAGNOSIS — Z720 Tobacco use: Secondary | ICD-10-CM | POA: Diagnosis present

## 2021-11-23 DIAGNOSIS — Z7984 Long term (current) use of oral hypoglycemic drugs: Secondary | ICD-10-CM | POA: Insufficient documentation

## 2021-11-23 DIAGNOSIS — Z8507 Personal history of malignant neoplasm of pancreas: Secondary | ICD-10-CM | POA: Insufficient documentation

## 2021-11-23 DIAGNOSIS — D72829 Elevated white blood cell count, unspecified: Secondary | ICD-10-CM | POA: Diagnosis present

## 2021-11-23 DIAGNOSIS — F17290 Nicotine dependence, other tobacco product, uncomplicated: Secondary | ICD-10-CM | POA: Insufficient documentation

## 2021-11-23 DIAGNOSIS — Z85520 Personal history of malignant carcinoid tumor of kidney: Secondary | ICD-10-CM | POA: Diagnosis not present

## 2021-11-23 DIAGNOSIS — Z20822 Contact with and (suspected) exposure to covid-19: Secondary | ICD-10-CM | POA: Insufficient documentation

## 2021-11-23 DIAGNOSIS — I2510 Atherosclerotic heart disease of native coronary artery without angina pectoris: Secondary | ICD-10-CM | POA: Diagnosis present

## 2021-11-23 DIAGNOSIS — E1129 Type 2 diabetes mellitus with other diabetic kidney complication: Secondary | ICD-10-CM | POA: Diagnosis present

## 2021-11-23 DIAGNOSIS — Z85038 Personal history of other malignant neoplasm of large intestine: Secondary | ICD-10-CM | POA: Diagnosis not present

## 2021-11-23 DIAGNOSIS — N1832 Chronic kidney disease, stage 3b: Secondary | ICD-10-CM | POA: Diagnosis not present

## 2021-11-23 DIAGNOSIS — R7989 Other specified abnormal findings of blood chemistry: Secondary | ICD-10-CM | POA: Diagnosis present

## 2021-11-23 DIAGNOSIS — I252 Old myocardial infarction: Secondary | ICD-10-CM | POA: Diagnosis not present

## 2021-11-23 LAB — TROPONIN I (HIGH SENSITIVITY)
Troponin I (High Sensitivity): 72 ng/L — ABNORMAL HIGH (ref <18)
Troponin I (High Sensitivity): 58 ng/L — ABNORMAL HIGH (ref <18)
Troponin I (High Sensitivity): 65 ng/L — ABNORMAL HIGH (ref <18)

## 2021-11-23 LAB — RESP PANEL BY RT-PCR (FLU A&B, COVID) ARPGX2
Influenza A by PCR: NEGATIVE
Influenza B by PCR: NEGATIVE
SARS Coronavirus 2 by RT PCR: NEGATIVE

## 2021-11-23 LAB — CBC WITH DIFFERENTIAL/PLATELET
Abs Immature Granulocytes: 0.02 10*3/uL (ref 0.00–0.07)
Basophils Absolute: 0.1 10*3/uL (ref 0.0–0.1)
Basophils Relative: 1 %
Eosinophils Absolute: 0.1 10*3/uL (ref 0.0–0.5)
Eosinophils Relative: 1 %
HCT: 45.3 % (ref 39.0–52.0)
Hemoglobin: 14.3 g/dL (ref 13.0–17.0)
Immature Granulocytes: 0 %
Lymphocytes Relative: 50 %
Lymphs Abs: 6.9 10*3/uL — ABNORMAL HIGH (ref 0.7–4.0)
MCH: 32.6 pg (ref 26.0–34.0)
MCHC: 31.6 g/dL (ref 30.0–36.0)
MCV: 103.4 fL — ABNORMAL HIGH (ref 80.0–100.0)
Monocytes Absolute: 1.3 10*3/uL — ABNORMAL HIGH (ref 0.1–1.0)
Monocytes Relative: 10 %
Neutro Abs: 5.1 10*3/uL (ref 1.7–7.7)
Neutrophils Relative %: 38 %
Platelets: 182 10*3/uL (ref 150–400)
RBC: 4.38 MIL/uL (ref 4.22–5.81)
RDW: 13.7 % (ref 11.5–15.5)
Smear Review: NORMAL
WBC: 13.5 10*3/uL — ABNORMAL HIGH (ref 4.0–10.5)
nRBC: 0 % (ref 0.0–0.2)

## 2021-11-23 LAB — LIPID PANEL
Cholesterol: 74 mg/dL (ref 0–200)
HDL: 33 mg/dL — ABNORMAL LOW (ref >40)
LDL Cholesterol: 18 mg/dL (ref 0–99)
Total CHOL/HDL Ratio: 2.2 RATIO
Triglycerides: 116 mg/dL (ref <150)
VLDL: 23 mg/dL (ref 0–40)

## 2021-11-23 LAB — COMPREHENSIVE METABOLIC PANEL
ALT: 63 U/L — ABNORMAL HIGH (ref 0–44)
AST: 46 U/L — ABNORMAL HIGH (ref 15–41)
Albumin: 3.8 g/dL (ref 3.5–5.0)
Alkaline Phosphatase: 60 U/L (ref 38–126)
Anion gap: 8 (ref 5–15)
BUN: 29 mg/dL — ABNORMAL HIGH (ref 8–23)
CO2: 24 mmol/L (ref 22–32)
Calcium: 9.8 mg/dL (ref 8.9–10.3)
Chloride: 104 mmol/L (ref 98–111)
Creatinine, Ser: 1.72 mg/dL — ABNORMAL HIGH (ref 0.61–1.24)
GFR, Estimated: 39 mL/min — ABNORMAL LOW (ref >60)
Glucose, Bld: 248 mg/dL — ABNORMAL HIGH (ref 70–99)
Potassium: 4.3 mmol/L (ref 3.5–5.1)
Sodium: 136 mmol/L (ref 135–145)
Total Bilirubin: 1 mg/dL (ref 0.3–1.2)
Total Protein: 7 g/dL (ref 6.5–8.1)

## 2021-11-23 LAB — GLUCOSE, CAPILLARY
Glucose-Capillary: 66 mg/dL — ABNORMAL LOW (ref 70–99)
Glucose-Capillary: 133 mg/dL — ABNORMAL HIGH (ref 70–99)

## 2021-11-23 LAB — BRAIN NATRIURETIC PEPTIDE: B Natriuretic Peptide: 258.1 pg/mL — ABNORMAL HIGH (ref 0.0–100.0)

## 2021-11-23 LAB — PROTIME-INR
INR: 1.1 (ref 0.8–1.2)
Prothrombin Time: 13.7 seconds (ref 11.4–15.2)

## 2021-11-23 LAB — CBG MONITORING, ED: Glucose-Capillary: 245 mg/dL — ABNORMAL HIGH (ref 70–99)

## 2021-11-23 LAB — D-DIMER, QUANTITATIVE: D-Dimer, Quant: 0.78 ug/mL-FEU — ABNORMAL HIGH (ref 0.00–0.50)

## 2021-11-23 MED ORDER — HYDRALAZINE HCL 20 MG/ML IJ SOLN: 5.0000 mg | INTRAMUSCULAR | Status: DC | PRN

## 2021-11-23 MED ORDER — ALBUTEROL SULFATE HFA 108 (90 BASE) MCG/ACT IN AERS
2.0000 | INHALATION_SPRAY | RESPIRATORY_TRACT | Status: DC | PRN
  Filled 2021-11-23: qty 6.7

## 2021-11-23 MED ORDER — NITROGLYCERIN 0.4 MG SL SUBL: 0.4000 mg | SUBLINGUAL_TABLET | SUBLINGUAL | Status: DC | PRN

## 2021-11-23 MED ORDER — INULIN 1.5 G PO CHEW
CHEWABLE_TABLET | Freq: Three times a day (TID) | ORAL | Status: DC
Start: 2021-11-23 — End: 2021-11-23

## 2021-11-23 MED ORDER — CETIRIZINE HCL 10 MG PO TABS: 10.0000 mg | ORAL_TABLET | Freq: Every day | ORAL | Status: DC

## 2021-11-23 MED ORDER — OCUVITE-LUTEIN PO CAPS
1.0000 | ORAL_CAPSULE | Freq: Two times a day (BID) | ORAL | Status: DC
  Administered 2021-11-24: 1 via ORAL
  Filled 2021-11-23: qty 1

## 2021-11-23 MED ORDER — GABAPENTIN 300 MG PO CAPS
300.0000 mg | ORAL_CAPSULE | Freq: Every day | ORAL | Status: DC
  Administered 2021-11-23: 300 mg via ORAL
  Filled 2021-11-23: qty 1

## 2021-11-23 MED ORDER — INSULIN ASPART 100 UNIT/ML IJ SOLN: 0.0000 [IU] | Freq: Every day | INTRAMUSCULAR | Status: DC

## 2021-11-23 MED ORDER — ROSUVASTATIN CALCIUM 10 MG PO TABS: 40.0000 mg | ORAL_TABLET | Freq: Every day | ORAL | Status: DC

## 2021-11-23 MED ORDER — MELATONIN 5 MG PO TABS
10.0000 mg | ORAL_TABLET | Freq: Every evening | ORAL | Status: DC | PRN
  Administered 2021-11-24: 10 mg via ORAL
  Filled 2021-11-23: qty 2

## 2021-11-23 MED ORDER — ENOXAPARIN SODIUM 40 MG/0.4ML IJ SOSY
40.0000 mg | PREFILLED_SYRINGE | Freq: Every day | INTRAMUSCULAR | Status: DC
  Administered 2021-11-23: 40 mg via SUBCUTANEOUS
  Filled 2021-11-23: qty 0.4

## 2021-11-23 MED ORDER — INSULIN ASPART 100 UNIT/ML IJ SOLN
0.0000 [IU] | Freq: Three times a day (TID) | INTRAMUSCULAR | Status: DC
  Administered 2021-11-23: 3 [IU] via SUBCUTANEOUS
  Administered 2021-11-24: 2 [IU] via SUBCUTANEOUS
  Administered 2021-11-24: 1 [IU] via SUBCUTANEOUS
  Filled 2021-11-23 (×3): qty 1

## 2021-11-23 MED ORDER — IOHEXOL 350 MG/ML SOLN
75.0000 mL | Freq: Once | INTRAVENOUS | Status: AC | PRN
  Administered 2021-11-23: 60 mL via INTRAVENOUS

## 2021-11-23 MED ORDER — VITAMIN B-6 50 MG PO TABS
100.0000 mg | ORAL_TABLET | Freq: Every day | ORAL | Status: DC
  Administered 2021-11-24: 100 mg via ORAL
  Filled 2021-11-23: qty 2

## 2021-11-23 MED ORDER — LEVOTHYROXINE SODIUM 50 MCG PO TABS
75.0000 ug | ORAL_TABLET | Freq: Every day | ORAL | Status: DC
  Administered 2021-11-24: 75 ug via ORAL
  Filled 2021-11-23: qty 1

## 2021-11-23 MED ORDER — ASPIRIN EC 81 MG PO TBEC
81.0000 mg | DELAYED_RELEASE_TABLET | Freq: Every day | ORAL | Status: DC
  Administered 2021-11-24: 81 mg via ORAL
  Filled 2021-11-23: qty 1

## 2021-11-23 MED ORDER — ALPRAZOLAM 0.25 MG PO TABS: 0.1250 mg | ORAL_TABLET | Freq: Every evening | ORAL | Status: DC | PRN

## 2021-11-23 MED ORDER — MORPHINE SULFATE (PF) 2 MG/ML IV SOLN: 1.0000 mg | INTRAVENOUS | Status: DC | PRN

## 2021-11-23 MED ORDER — ACETAMINOPHEN 325 MG PO TABS
650.0000 mg | ORAL_TABLET | Freq: Four times a day (QID) | ORAL | Status: DC | PRN
Start: 2021-11-23 — End: 2021-11-24

## 2021-11-23 MED ORDER — PANTOPRAZOLE SODIUM 40 MG PO TBEC
40.0000 mg | DELAYED_RELEASE_TABLET | Freq: Every day | ORAL | Status: DC
  Administered 2021-11-24: 40 mg via ORAL
  Filled 2021-11-23: qty 1

## 2021-11-23 MED ORDER — CARVEDILOL 12.5 MG PO TABS
12.5000 mg | ORAL_TABLET | Freq: Two times a day (BID) | ORAL | Status: DC
  Administered 2021-11-23 – 2021-11-24 (×2): 12.5 mg via ORAL
  Filled 2021-11-23 (×2): qty 1

## 2021-11-23 MED ORDER — HYDRALAZINE HCL 25 MG PO TABS
25.0000 mg | ORAL_TABLET | Freq: Two times a day (BID) | ORAL | Status: DC
  Administered 2021-11-23 – 2021-11-24 (×2): 25 mg via ORAL
  Filled 2021-11-23 (×2): qty 1

## 2021-11-23 MED ORDER — DM-GUAIFENESIN ER 30-600 MG PO TB12
1.0000 | ORAL_TABLET | Freq: Two times a day (BID) | ORAL | Status: DC | PRN
Start: 2021-11-23 — End: 2021-11-24

## 2021-11-23 MED ORDER — VITAMIN D 25 MCG (1000 UNIT) PO TABS
1000.0000 [IU] | ORAL_TABLET | Freq: Every day | ORAL | Status: DC
  Administered 2021-11-24: 1000 [IU] via ORAL
  Filled 2021-11-23: qty 1

## 2021-11-23 MED ORDER — CALCIUM CARBONATE ANTACID 500 MG PO CHEW: 400.0000 mg | CHEWABLE_TABLET | Freq: Every day | ORAL | Status: DC | PRN

## 2021-11-23 MED ORDER — ONDANSETRON HCL 4 MG/2ML IJ SOLN: 4.0000 mg | Freq: Three times a day (TID) | INTRAMUSCULAR | Status: DC | PRN

## 2021-11-23 MED ORDER — NICOTINE 21 MG/24HR TD PT24
21.0000 mg | MEDICATED_PATCH | Freq: Every day | TRANSDERMAL | Status: DC
Start: 2021-11-23 — End: 2021-11-24
  Filled 2021-11-23 (×2): qty 1

## 2021-11-23 NOTE — ED Triage Notes (Signed)
Patient to ER via Pov with family, reports several weeks of shortness of breath and worsening weakness. Also reports pain on the right side of his ribs.  ?

## 2021-11-23 NOTE — Assessment & Plan Note (Signed)
See above

## 2021-11-23 NOTE — Assessment & Plan Note (Signed)
WBC 13.5.  No fever.  No source of infection identified, likely reactive ?-Follow-up with CBC ?

## 2021-11-23 NOTE — Assessment & Plan Note (Signed)
Synthroid 

## 2021-11-23 NOTE — Assessment & Plan Note (Addendum)
SOB, hx of CAD and elevated trop, s/p of CABG: ?Patient has exertional shortness of breath.  Etiology is not clear.  D-dimer positive, but CT angiogram was negative for PE.  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   ? ?Patient has long history of smoking, but no wheezing or rhonchi on auscultation, does not seem to have COPD exacerbation.  No pulm edema or leg edema, does not seem to have acute CHF.  Patient has elevated troponin 72 --> 58.  Anginal equivalent is potential differential diagnosis.  Consulted to Dr. Clayborn Bigness of cardiology. ? ? ?- place to tele bed for observation ?- As needed albuterol ?- Trend Trop ?- prn Nitroglycerin, Morphine ?- Aspirin, Crestor ?- Risk factor stratification: will check FLP and A1C  ?- LE doppler to r/o DVT ?- f/u covid19 test ? ? ?

## 2021-11-23 NOTE — Assessment & Plan Note (Signed)
Recent A1c 9.6, poorly controlled.  Patient taking Jardiance, dulaglutide, glipizide ?-Sliding scale insulin ?

## 2021-11-23 NOTE — Assessment & Plan Note (Signed)
crestor

## 2021-11-23 NOTE — ED Provider Notes (Signed)
? ?[facility] ?Provider Note ? ? ? Event Date/Time  ? First MD Initiated Contact with Patient 11/23/21 1333   ?  (approximate) ? ? ?History  ? ?Shortness of Breath ? ? ?HPI ? ?Daniel Bray is a 87 y.o. male patient reports several weeks of increasing shortness of breath and weakness.  This is particularly bothersome when he is exercising or walking outside.  He does not have any problems lying down or lying flat.  He has some pain on the right side of his ribs but not really with deep breathing. ? ?  ? ? ?Physical Exam  ? ?Triage Vital Signs: ?ED Triage Vitals  ?Enc Vitals Group  ?   BP 11/23/21 1128 (!) 154/85  ?   Pulse Rate 11/23/21 1128 74  ?   Resp 11/23/21 1128 (!) 22  ?   Temp 11/23/21 1157 97.7 ?F (36.5 ?C)  ?   Temp src --   ?   SpO2 11/23/21 1128 95 %  ?   Weight --   ?   Height 11/23/21 1128 '5\' 10"'$  (1.778 m)  ?   Head Circumference --   ?   Peak Flow --   ?   Pain Score 11/23/21 1128 2  ?   Pain Loc --   ?   Pain Edu? --   ?   Excl. in [place]? --   ? ? ?Most recent vital signs: ?Vitals:  ? 11/23/21 1500 11/23/21 1530  ?BP: (!) 150/85 (!) 149/82  ?Pulse: (!) 134 68  ?Resp: 18 18  ?Temp:    ?SpO2: 98% 98%  ? ? ? ?General: Awake, no distress.  ?CV:  Good peripheral perfusion.  Heart regular rate and rhythm no audible murmurs ?Resp:  Normal effort.  Lungs are clear ?Abd:  No distention. Soft bs + not tender ?Ext: no edema ? ? ?ED Results / Procedures / Treatments  ? ?Labs ?(all labs ordered are listed, but only abnormal results are displayed) ?Labs Reviewed  ?COMPREHENSIVE METABOLIC PANEL - Abnormal; Notable for the following components:  ?    Result Value  ? Glucose, Bld 248 (*)   ? BUN 29 (*)   ? Creatinine, Ser 1.72 (*)   ? AST 46 (*)   ? ALT 63 (*)   ? GFR, Estimated 39 (*)   ? All other components within normal limits  ?CBC WITH DIFFERENTIAL/PLATELET - Abnormal; Notable for the following components:  ? WBC 13.5 (*)   ? MCV 103.4 (*)   ? Lymphs Abs 6.9 (*)   ? Monocytes Absolute  1.3 (*)   ? All other components within normal limits  ?D-DIMER, QUANTITATIVE - Abnormal; Notable for the following components:  ? D-Dimer, Quant 0.78 (*)   ? All other components within normal limits  ?TROPONIN I (HIGH SENSITIVITY) - Abnormal; Notable for the following components:  ? Troponin I (High Sensitivity) 72 (*)   ? All other components within normal limits  ?TROPONIN I (HIGH SENSITIVITY) - Abnormal; Notable for the following components:  ? Troponin I (High Sensitivity) 58 (*)   ? All other components within normal limits  ? ? ? ?EKG ? ?EKG read interpreted by me shows normal sinus rhythm rate of 75 left axis left bundle branch flipped or downsloping T waves in 1 and L and V6.  These changes are all present in some of the old EKGs. ? ? ?RADIOLOGY ?Chest x-ray read by radiology films reviewed by me show no  acute disease ?Chest CT read by radiology films reviewed by me showed no acute disease only stable pulmonary nodules. ? ?PROCEDURES: ? ?Critical Care performed:  ? ?Procedures ? ? ?MEDICATIONS ORDERED IN ED: ?Medications  ?albuterol (VENTOLIN HFA) 108 (90 Base) MCG/ACT inhaler 2 puff (has no administration in time range)  ?iohexol (OMNIPAQUE) 350 MG/ML injection 75 mL (60 mLs Intravenous Contrast Given 11/23/21 1506)  ? ? ? ?IMPRESSION / MDM / ASSESSMENT AND PLAN / ED COURSE  ?I reviewed the triage vital signs and the nursing notes. ?Patient with exertional dyspnea.  He had a cath in December that showed a 90% blockage 1 artery at least 2 arteries to 30% and at least 1 artery 40% blockage in 1 other artery with 100% proximal blockage.  He has had stents and bypass pass in the past.  I am worried that this could represent exertional angina which is worsening.  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  I would like to bring him in the hospital and evaluate him further.  It is also possible he has pulmonary disease causing this problem.  He does see Dr. Raul Del a pulmonologist. ? ? ?The patient is on the cardiac  monitor to evaluate for evidence of arrhythmia and/or significant heart rate changes.  None have been seen ? ?  ? ? ?FINAL CLINICAL IMPRESSION(S) / ED DIAGNOSES  ? ?Final diagnoses:  ?Exertional shortness of breath  ? ? ? ?Rx / DC Orders  ? ?ED Discharge Orders   ? ? None  ? ?  ? ? ? ?Note:  This document was prepared using Dragon voice recognition software and may include unintentional dictation errors. ?  ?Nena Polio, MD ?11/23/21 1631 ? ?

## 2021-11-23 NOTE — H&P (Addendum)
?History and Physical  ? ? ?[name]:[id number] DOB: 04/04/1935 DOA: 11/23/2021 ? ?Referring MD/NP/PA:  ? ?PCP: Elza Rafter, MD  ? ?Patient coming from:  The patient is coming from home.  At baseline, pt is independent for most of ADL.       ? ?Chief Complaint: SOB ? ?HPI: Daniel Bray is a 87 y.o. male with medical history significant of pancreatic cancer, s/p pancreas resecrtion, renal cancer, s/p of left nephrectomy, s/p of partial colectomy, CAD, s/p of CABG, hypertension, hyperlipidemia, diabetes mellitus, GERD, hypothyroidism, anxiety, CKD-3B, small bowel obstruction, tobacco abuse, who presents with shortness of breath. ? ?Patient states that he has shortness of breath for almost 8 weeks.  It is exertional.  No cough, fever or chills.  He has some mild intermittent right-sided chest pain, and right flank pain which is aching, mild, nonradiating not pleuritic.  Denies nausea, vomiting, diarrhea or abdominal pain.  No symptoms of UTI.  No tenderness on chest wall.  Patient was recently seen by pulmonologist, Dr. 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 ? ? ?Cardiac cath by Dr. Corky Sox on 08/03/21: ?  Prox RCA to Mid RCA lesion is 10% stenosed. ?  Prox RCA lesion is 30% stenosed. ?  Mid RCA to Dist RCA lesion is 30% stenosed. ?  Ost LAD to Prox LAD lesion is 100% stenosed. ?  Ost LM to Mid LM lesion is 40% stenosed. ?  2nd Mrg lesion is 90% stenosed. ?  LV end diastolic pressure is normal. ?  There is no aortic valve stenosis. ?  ?Conclusion: ?Coronary artery disease including chronic total occlusion of the ostial LAD, moderate LMCA disease, 90% OM2 stenosis. ?Patent bypass grafts including SVG to D1, SVG to OM 2, LIMA to LAD. ?Normal LVEDP of 8 mmHg. ?  ? ?SNIFF test by Dr. Raul Del on 11/16/21 ?1. Mild chronic elevation of the right diaphragm with decreased  ?diaphragmatic motion during inspiration and expiration with  ?reciprocal  motion relative to the left side concerning for  ?paralysis.  ? ? ? ?Data Reviewed and ED Course: pt was found to have trop level 72, 58, positive D-dimer 0.78, pending COVID PCR, renal function close to baseline, temperature normal, blood pressure 149/82, heart rate 134, 68, RR 22, chest x-ray negative.  CT angiogram is negative for PE, but showed multiple pulmonary nodules.  Patient is placed on telemetry bed for position, Dr. Clayborn Bigness of cardiology is consulted. ? ?CTA: ?1. No acute intrathoracic abnormality. Specifically, no pulmonary ?embolism as queried. ?2. Multiple solid pulmonary nodules measuring up to 0.6 cm, ?unchanged compared to 12/02/2020. Given the stability of the ?nodules, follow-up noncontrast CT chest in 1 year is considered ?optional for low-risk patients, but is recommended for high-risk ?patients. This recommendation follows the consensus statement: ?Guidelines for Management of Incidental Pulmonary Nodules Detected ?on CT Images: From the Fleischner Society 2017; Radiology 2017; ?284:228-243. ?3. Multivessel coronary artery disease status post  ? ? ?EKG: I have personally reviewed.  Sinus rhythm, QTc 451, LAD, poor R wave progression, mild ST depression in the lateral leads, anteroseptal infarction pattern ? ? ?Review of Systems:  ? ?General: no fevers, chills, no body weight gain,  fatigue ?HEENT: no blurry vision, hearing changes or sore throat ?Respiratory: has dyspnea, no coughing, wheezing ?CV: has mild chest pain, no palpitations ?GI: no nausea, vomiting, abdominal pain, diarrhea, constipation ?GU: no dysuria, burning on urination, increased urinary frequency, hematuria  ?Ext: no leg edema ?Neuro:  no unilateral weakness, numbness, or tingling, no vision change or hearing loss ?Skin: no rash, no skin tear. ?MSK: No muscle spasm, no deformity, no limitation of range of movement in spin ?Heme: No easy bruising.  ?Travel history: No recent long distant travel. ? ? ?Allergy:  ?Allergies   ?Allergen Reactions  ? Levofloxacin Hives and Other (See Comments)  ? Levaquin [Levofloxacin In D5w]   ?  Weakness, low blood pressure  ? Januvia [Sitagliptin] Rash  ? ? ?Past Medical History:  ?Diagnosis Date  ? Cancer [facility])   ? kidney  ? Cancer [facility])   ? pancreas  ? Cancer [facility])   ? colon   ? Diabetes mellitus without complication ([place])   ? Hyperlipidemia   ? Hypertension   ? Myocardial infarction [facility])   ? Streptococcal infection   ? Strep Bovis  ? Thyroid disease   ? ? ?Past Surgical History:  ?Procedure Laterality Date  ? CARDIAC ELECTROPHYSIOLOGY STUDY AND ABLATION    ? CARDIAC SURGERY    ? CORONARY ANGIOPLASTY WITH STENT PLACEMENT    ? CORONARY ARTERY BYPASS GRAFT    ? x3  ? LEFT HEART CATH AND CORONARY ANGIOGRAPHY N/A 08/03/2021  ? Procedure: LEFT HEART CATH AND CORONARY ANGIOGRAPHY;  Surgeon: Andrez Grime, MD;  Location: [place] [facility];  Service: Cardiovascular;  Laterality: N/A;  ? PANCREAS SURGERY    ? SPLENECTOMY    ? ? ?Social History:  reports that he has been smoking cigars. His smokeless tobacco use includes chew. He reports that he does not drink alcohol and does not use drugs. ? ?Family History:  ?Family History  ?Problem Relation Age of Onset  ? Pancreatic cancer Mother   ? Liver cancer Mother   ?  ? ?Prior to Admission medications   ?Medication Sig Start Date End Date Taking? Authorizing Provider  ?aspirin EC 81 MG tablet Take 81 mg by mouth daily.   Yes [provider]  ?carvedilol (COREG) 12.5 MG tablet Take 12.5 mg by mouth 2 (two) times daily. 08/27/21  Yes [provider]  ?cholecalciferol (VITAMIN D) 1000 units tablet Take 1,000 Units by mouth daily.   Yes [provider]  ?Dulaglutide 3 MG/0.5ML SOPN Inject 3 mg into the skin every Thursday.   Yes [provider]  ?empagliflozin (JARDIANCE) 25 MG TABS tablet Take 25 mg by mouth daily.   Yes [provider]  ?gabapentin (NEURONTIN) 300 MG capsule Take 300 mg by mouth at bedtime.    Yes [provider]  ?glipiZIDE (GLUCOTROL) 10 MG tablet Take 10 mg by mouth 2 (two) times daily with a meal.    Yes [provider]  ?hydrALAZINE (APRESOLINE) 25 MG tablet Take 25 mg by mouth 2 (two) times daily.   Yes [provider]  ?Inulin (FIBER CHOICE PO) Take 5 tablets by mouth with breakfast, with lunch, and with evening meal.   Yes [provider]  ?levocetirizine (XYZAL) 5 MG tablet Take 5 mg by mouth every evening. 11/27/20  Yes [provider]  ?levothyroxine (SYNTHROID) 75 MCG tablet Take 75 mcg by mouth daily before breakfast.    Yes [provider]  ?Melatonin 10 MG TABS Take 10 mg by mouth at bedtime as needed (sleep).   Yes [provider]  ?Multiple Vitamins-Minerals (PRESERVISION AREDS 2+MULTI VIT PO) Take 1 capsule by mouth in the morning and at bedtime.   Yes [provider]  ?omeprazole (PRILOSEC) 40 MG capsule Take 40 mg by  mouth daily.    Yes [provider]  ?pyridoxine (B-6) 100 MG tablet Take 100 mg by mouth daily.   Yes [provider]  ?rosuvastatin (CRESTOR) 40 MG tablet Take 40 mg by mouth daily.   Yes [provider]  ?acetaminophen (TYLENOL) 650 MG CR tablet Take 1,300 mg by mouth every 8 (eight) hours as needed for pain.    [provider]  ?ALPRAZolam Duanne Moron) 0.25 MG tablet Take 0.125 mg by mouth at bedtime as needed for sleep.  ?Patient not taking: Reported on 11/23/2021    [provider]  ?Calcium Carbonate-Simethicone (ALKA-SELTZER HEARTBURN + GAS) 750-80 MG CHEW Chew 2 each by mouth daily as needed (gas/heartburn).    [provider]  ?diclofenac Sodium (VOLTAREN) 1 % GEL Apply 1 application topically daily as needed (pain).    [provider]  ?lipase/protease/amylase (CREON) 36000 UNITS CPEP capsule 72,000 Units 3 (three) times daily with meals. ?Patient not taking: Reported on 11/23/2021    [provider]  ?oxymetazoline (AFRIN) 0.05  % nasal spray Place 1 spray into both nostrils at bedtime.    [provider]  ? ? ?Physical Exam: ?Vitals:  ? 11/23/21 1530 11/23/21 1600 11/23/21 1630 11/23/21 1700  ?BP: (!) 149/82 (!) 144/77 (!) 1

## 2021-11-23 NOTE — ED Notes (Signed)
Pt given sandwich and water per Cinda Quest MD verbal request ?

## 2021-11-23 NOTE — Assessment & Plan Note (Signed)
-  Nicotine patch 

## 2021-11-23 NOTE — ED Notes (Signed)
Pt. Refused nicotine patch for now, states if he needs it, he will let staff know. Pt. In room watching tv with spouse, NAD. Denies further need or desire. ?

## 2021-11-23 NOTE — Consult Note (Signed)
?CARDIOLOGY CONSULT NOTE  ? ?   ?    ?  ? ? ? ?Patient ID: ?Daniel Bray ?MRN: [id number] ?DOB/AGE: 1934/09/06 87 y.o. ? ?Admit date: 11/23/2021 ?Referring Physician Dr Ivor Costa MD hospitalist ?Primary Physician Dr. Nada Boozer Jos?-Matthews MD primary ?Primary Cardiologist Dr. Cain Saupe ?Reason for Consultation shortness of breath ? ?HPI: Patient [age] year old white male history of multiple medical problems pancreatic cancer pancreatic resection renal cancer left nephrectomy with partial colectomy multivessel coronary disease status post coronary bypass surgery hypertension hyperlipidemia diabetes GERD anxiety chronic renal sufficiency tobacco abuse recently complained of progressive shortness of breath has had extensive cardiac work-up including cardiac cath CT echocardiogram patient also seen by pulmonary recently had a sniff test to suggest possible partial paralysis of the right hemidiaphragm patient's been treated medically conservatively but Occasionally short of breath so came to the emergency room for evaluation and was admitted for further assessment with borderline troponins.  Denies any discrete chest pain chest pressure or any symptoms suggestive of angina ? ?Review of systems complete and found to be negative unless listed above  ? ? ? ?Past Medical History:  ?Diagnosis Date  ? Cancer [facility])   ? kidney  ? Cancer [facility])   ? pancreas  ? Cancer [facility])   ? colon   ? Diabetes mellitus without complication ([place])   ? Hyperlipidemia   ? Hypertension   ? Myocardial infarction [facility])   ? Streptococcal infection   ? Strep Bovis  ? Thyroid disease   ?  ?Past Surgical History:  ?Procedure Laterality Date  ? CARDIAC ELECTROPHYSIOLOGY STUDY AND ABLATION    ? CARDIAC SURGERY    ? CORONARY ANGIOPLASTY WITH STENT PLACEMENT    ? CORONARY ARTERY BYPASS GRAFT    ? x3  ? LEFT HEART CATH AND CORONARY ANGIOGRAPHY N/A 08/03/2021  ? Procedure: LEFT HEART CATH AND CORONARY ANGIOGRAPHY;  Surgeon: Andrez Grime, MD;   Location: [place] [facility];  Service: Cardiovascular;  Laterality: N/A;  ? PANCREAS SURGERY    ? SPLENECTOMY    ?  ?(Not in a hospital admission) ? ?Social History  ? ?Socioeconomic History  ? Marital status: Married  ?  Spouse name: Not on file  ? Number of children: Not on file  ? Years of education: Not on file  ? Highest education level: Not on file  ?Occupational History  ? Not on file  ?Tobacco Use  ? Smoking status: Every Day  ?  Types: Cigars  ? Smokeless tobacco: Current  ?  Types: Chew  ?Vaping Use  ? Vaping Use: Never used  ?Substance and Sexual Activity  ? Alcohol use: No  ? Drug use: No  ? Sexual activity: Not on file  ?Other Topics Concern  ? Not on file  ?Social History Narrative  ? Not on file  ? ?Social Determinants of Health  ? ?Financial Resource Strain: Not on file  ?Food Insecurity: Not on file  ?Transportation Needs: Not on file  ?Physical Activity: Not on file  ?Stress: Not on file  ?Social Connections: Not on file  ?Intimate Partner Violence: Not on file  ?  ?Family History  ?Problem Relation Age of Onset  ? Pancreatic cancer Mother   ? Liver cancer Mother   ?  ? ? ?Review of systems complete and found to be negative unless listed above  ? ? ? ? ?PHYSICAL EXAM ? ?General: Well developed, well nourished, in no acute distress ?HEENT:  Normocephalic and atramatic ?Neck:  No JVD.  ?Lungs: Clear bilaterally to auscultation and percussion. ?Heart: HRRR . Normal S1 and S2 without gallops or murmurs.  ?Abdomen: Bowel sounds are positive, abdomen soft and non-tender  ?Msk:  Back normal, normal gait. Normal strength and tone for age. ?Extremities: No clubbing, cyanosis or edema.   ?Neuro: Alert and oriented X 3. ?Psych:  Good affect, responds appropriately ? ?Labs: ?  ?Lab Results  ?Component Value Date  ? WBC 13.5 (H) 11/23/2021  ? HGB 14.3 11/23/2021  ? HCT 45.3 11/23/2021  ? MCV 103.4 (H) 11/23/2021  ? PLT 182 11/23/2021  ?  ?Recent Labs  ?Lab 11/23/21 ?1136  ?NA 136  ?K 4.3  ?CL 104  ?CO2 24   ?BUN 29*  ?CREATININE 1.72*  ?CALCIUM 9.8  ?PROT 7.0  ?BILITOT 1.0  ?ALKPHOS 60  ?ALT 63*  ?AST 46*  ?GLUCOSE 248*  ? ?Lab Results  ?Component Value Date  ? CKTOTAL 33 (L) 02/07/2015  ? TROPONINI 0.05 (HH) 10/03/2016  ?  ?Lab Results  ?Component Value Date  ? CHOL 75 05/29/2020  ? CHOL 90 01/25/2020  ? ?Lab Results  ?Component Value Date  ? HDL 42 05/29/2020  ? HDL 36 (L) 01/25/2020  ? ?Lab Results  ?Component Value Date  ? [place] 13 05/29/2020  ? [place] 27 01/25/2020  ? ?Lab Results  ?Component Value Date  ? TRIG 101 05/29/2020  ? TRIG 134 01/25/2020  ? ?Lab Results  ?Component Value Date  ? CHOLHDL 1.8 05/29/2020  ? CHOLHDL 2.5 01/25/2020  ? ?No results found for: LDLDIRECT  ?  ?Radiology: DG Chest 2 View ? ?Result Date: 11/23/2021 ?CLINICAL DATA:  Shortness of breath.  Right chest pain. EXAM: CHEST - 2 VIEW COMPARISON:  AP chest 12/02/2020 FINDINGS: Interval removal of enteric tube. Status post median sternotomy and CABG. Cardiac silhouette and mediastinal contours are within normal limits. Moderate calcification within the aortic arch. The lungs are clear. No pleural effusion or pneumothorax. No acute skeletal abnormality. Surgical clips overlie the left upper abdominal quadrant. IMPRESSION: No active cardiopulmonary disease. Electronically Signed   By: Yvonne Kendall M.D.   On: 11/23/2021 11:53  ? ?CT Angio Chest PE W and/or Wo Contrast ? ?Result Date: 11/23/2021 ?CLINICAL DATA:  Several weeks of shortness of breath and weakness. Pain of the right ribs. EXAM: CT ANGIOGRAPHY CHEST WITH CONTRAST TECHNIQUE: Multidetector CT imaging of the chest was performed using the standard protocol during bolus administration of intravenous contrast. Multiplanar CT image reconstructions and MIPs were obtained to evaluate the vascular anatomy. RADIATION DOSE REDUCTION: This exam was performed according to the departmental dose-optimization program which includes automated exposure control, adjustment of the mA and/or kV  according to patient size and/or use of iterative reconstruction technique. CONTRAST:  80m OMNIPAQUE IOHEXOL 350 MG/ML SOLN COMPARISON:  CT angio chest 10/25/2021 FINDINGS: Cardiovascular: Satisfactory opacification of the pulmonary arteries to the segmental level. No evidence of pulmonary embolism. Normal heart size. No pericardial effusion. Postoperative changes of median sternotomy and coronary artery bypass graft. Coronary artery stent. Multivessel coronary artery calcifications. Scattered calcific aortic atherosclerosis. Mediastinum/Nodes: No enlarged mediastinal, hilar, or axillary lymph nodes. Thyroid gland, trachea, and esophagus demonstrate no significant findings. Lungs/Pleura: Subsegmental atelectasis at the left lung base. No new focal consolidation, pleural effusion, or pneumothorax. A 0.6 cm subpleural nodule at the left lung base (series 6, image 74) and 0.3 cm nodule in the superior segment of the right lower lobe (series 6, image 33) are stable compared to 12/02/2020. An  additional 0.4 cm solid nodule in the right upper lobe is also stable compared to 12/02/2020 (series 6, image 46). Upper Abdomen: No acute abnormality. Post surgical changes of splenectomy. Musculoskeletal: No chest wall abnormality. No acute or suspicious osseous findings. Review of the MIP images confirms the above findings. IMPRESSION: 1. No acute intrathoracic abnormality. Specifically, no pulmonary embolism as queried. 2. Multiple solid pulmonary nodules measuring up to 0.6 cm, unchanged compared to 12/02/2020. Given the stability of the nodules, follow-up noncontrast CT chest in 1 year is considered optional for low-risk patients, but is recommended for high-risk patients. This recommendation follows the consensus statement: Guidelines for Management of Incidental Pulmonary Nodules Detected on CT Images: From the Fleischner Society 2017; Radiology 2017; 284:228-243. 3. Multivessel coronary artery disease status post CABG and  coronary artery stent. Aortic Atherosclerosis (ICD10-I70.0). Electronically Signed   By: Ileana Roup M.D.   On: 11/23/2021 15:38  ? ?CT Angio Chest Pulmonary Embolism (PE) W or WO Contrast ? ?Result Date: 10/25/2021 ?

## 2021-11-23 NOTE — Assessment & Plan Note (Signed)
-  prn xanax ?

## 2021-11-23 NOTE — Assessment & Plan Note (Signed)
Baseline creatinine 1.2-1.8 recently.  His creatinine is 1.72, BUN 29.  Close to baseline ?-Follow-up with BMP ?

## 2021-11-24 ENCOUNTER — Other Ambulatory Visit: Payer: Self-pay

## 2021-11-24 DIAGNOSIS — R0602 Shortness of breath: Secondary | ICD-10-CM | POA: Diagnosis not present

## 2021-11-24 LAB — BASIC METABOLIC PANEL
Anion gap: 4 — ABNORMAL LOW (ref 5–15)
BUN: 25 mg/dL — ABNORMAL HIGH (ref 8–23)
CO2: 26 mmol/L (ref 22–32)
Calcium: 9.6 mg/dL (ref 8.9–10.3)
Chloride: 108 mmol/L (ref 98–111)
Creatinine, Ser: 1.45 mg/dL — ABNORMAL HIGH (ref 0.61–1.24)
GFR, Estimated: 48 mL/min — ABNORMAL LOW (ref >60)
Glucose, Bld: 158 mg/dL — ABNORMAL HIGH (ref 70–99)
Potassium: 4.3 mmol/L (ref 3.5–5.1)
Sodium: 138 mmol/L (ref 135–145)

## 2021-11-24 LAB — CBC
HCT: 43.4 % (ref 39.0–52.0)
Hemoglobin: 13.7 g/dL (ref 13.0–17.0)
MCH: 31.9 pg (ref 26.0–34.0)
MCHC: 31.6 g/dL (ref 30.0–36.0)
MCV: 101.2 fL — ABNORMAL HIGH (ref 80.0–100.0)
Platelets: 179 10*3/uL (ref 150–400)
RBC: 4.29 MIL/uL (ref 4.22–5.81)
RDW: 13.8 % (ref 11.5–15.5)
WBC: 16.6 10*3/uL — ABNORMAL HIGH (ref 4.0–10.5)
nRBC: 0 % (ref 0.0–0.2)

## 2021-11-24 LAB — GLUCOSE, CAPILLARY
Glucose-Capillary: 137 mg/dL — ABNORMAL HIGH (ref 70–99)
Glucose-Capillary: 143 mg/dL — ABNORMAL HIGH (ref 70–99)
Glucose-Capillary: 160 mg/dL — ABNORMAL HIGH (ref 70–99)

## 2021-11-24 LAB — HEMOGLOBIN A1C
Hgb A1c MFr Bld: 8.9 % — ABNORMAL HIGH (ref 4.8–5.6)
Mean Plasma Glucose: 208.73 mg/dL

## 2021-11-24 MED ORDER — ALBUTEROL SULFATE HFA 108 (90 BASE) MCG/ACT IN AERS
2.0000 | INHALATION_SPRAY | RESPIRATORY_TRACT | 0 refills | Status: AC | PRN
Start: 2021-11-24 — End: ?

## 2021-11-24 NOTE — Progress Notes (Signed)
[facility] ? ? ? ?SUBJECTIVE: Patient states he feels reasonably well no shortness of breath no chest pain ambulated well ready for discharge ? ? ?Vitals:  ? 11/24/21 0015 11/24/21 0434 11/24/21 0800 11/24/21 1122  ?BP: 136/84 (!) 147/86 (!) 144/93 118/69  ?Pulse: 73 71 68 90  ?Resp: '16 18 18 18  '$ ?Temp: (!) 97.5 ?F (36.4 ?C) 97.9 ?F (36.6 ?C) 97.6 ?F (36.4 ?C) 97.8 ?F (36.6 ?C)  ?TempSrc: Oral Oral    ?SpO2: 98% 98% 99% 96%  ?Weight:      ?Height:      ? ? ? ?Intake/Output Summary (Last 24 hours) at 11/24/2021 1309 ?Last data filed at 11/24/2021 1057 ?Gross per 24 hour  ?Intake 240 ml  ?Output 200 ml  ?Net 40 ml  ? ? ? ? ?PHYSICAL EXAM ? ?General: Well developed, well nourished, in no acute distress ?HEENT:  Normocephalic and atramatic ?Neck:  No JVD.  ?Lungs: Clear bilaterally to auscultation and percussion. ?Heart: HRRR . Normal S1 and S2 without gallops or murmurs.  ?Abdomen: Bowel sounds are positive, abdomen soft and non-tender  ?Msk:  Back normal, normal gait. Normal strength and tone for age. ?Extremities: No clubbing, cyanosis or edema.   ?Neuro: Alert and oriented X 3. ?Psych:  Good affect, responds appropriately ? ? ?LABS: ?Basic Metabolic Panel: ?Recent Labs  ?  11/23/21 ?1136 11/24/21 ?0654  ?NA 136 138  ?K 4.3 4.3  ?CL 104 108  ?CO2 24 26  ?GLUCOSE 248* 158*  ?BUN 29* 25*  ?CREATININE 1.72* 1.45*  ?CALCIUM 9.8 9.6  ? ?Liver Function Tests: ?Recent Labs  ?  11/23/21 ?1136  ?AST 46*  ?ALT 63*  ?ALKPHOS 60  ?BILITOT 1.0  ?PROT 7.0  ?ALBUMIN 3.8  ? ?No results for input(s): LIPASE, AMYLASE in the last 72 hours. ?CBC: ?Recent Labs  ?  11/23/21 ?1136 11/24/21 ?0654  ?WBC 13.5* 16.6*  ?NEUTROABS 5.1  --   ?HGB 14.3 13.7  ?HCT 45.3 43.4  ?MCV 103.4* 101.2*  ?PLT 182 179  ? ?Cardiac Enzymes: ?No results for input(s): CKTOTAL, CKMB, CKMBINDEX, TROPONINI in the last 72 hours. ?BNP: ?Invalid input(s): POCBNP ?D-Dimer: ?Recent Labs  ?  11/23/21 ?1356  ?DDIMER 0.78*  ? ?Hemoglobin A1C: ?No results for input(s):  HGBA1C in the last 72 hours. ?Fasting Lipid Panel: ?Recent Labs  ?  11/23/21 ?1926  ?CHOL 74  ?HDL 33*  ?[place] 18  ?TRIG 116  ?CHOLHDL 2.2  ? ?Thyroid Function Tests: ?No results for input(s): TSH, T4TOTAL, T3FREE, THYROIDAB in the last 72 hours. ? ?Invalid input(s): FREET3 ?Anemia Panel: ?No results for input(s): VITAMINB12, FOLATE, FERRITIN, TIBC, IRON, RETICCTPCT in the last 72 hours. ? ?DG Chest 2 View ? ?Result Date: 11/23/2021 ?CLINICAL DATA:  Shortness of breath.  Right chest pain. EXAM: CHEST - 2 VIEW COMPARISON:  AP chest 12/02/2020 FINDINGS: Interval removal of enteric tube. Status post median sternotomy and CABG. Cardiac silhouette and mediastinal contours are within normal limits. Moderate calcification within the aortic arch. The lungs are clear. No pleural effusion or pneumothorax. No acute skeletal abnormality. Surgical clips overlie the left upper abdominal quadrant. IMPRESSION: No active cardiopulmonary disease. Electronically Signed   By: Yvonne Kendall M.D.   On: 11/23/2021 11:53  ? ?CT Angio Chest PE W and/or Wo Contrast ? ?Result Date: 11/23/2021 ?CLINICAL DATA:  Several weeks of shortness of breath and weakness. Pain of the right ribs. EXAM: CT ANGIOGRAPHY CHEST WITH CONTRAST TECHNIQUE: Multidetector CT imaging of the chest was performed  using the standard protocol during bolus administration of intravenous contrast. Multiplanar CT image reconstructions and MIPs were obtained to evaluate the vascular anatomy. RADIATION DOSE REDUCTION: This exam was performed according to the departmental dose-optimization program which includes automated exposure control, adjustment of the mA and/or kV according to patient size and/or use of iterative reconstruction technique. CONTRAST:  37m OMNIPAQUE IOHEXOL 350 MG/ML SOLN COMPARISON:  CT angio chest 10/25/2021 FINDINGS: Cardiovascular: Satisfactory opacification of the pulmonary arteries to the segmental level. No evidence of pulmonary embolism. Normal heart  size. No pericardial effusion. Postoperative changes of median sternotomy and coronary artery bypass graft. Coronary artery stent. Multivessel coronary artery calcifications. Scattered calcific aortic atherosclerosis. Mediastinum/Nodes: No enlarged mediastinal, hilar, or axillary lymph nodes. Thyroid gland, trachea, and esophagus demonstrate no significant findings. Lungs/Pleura: Subsegmental atelectasis at the left lung base. No new focal consolidation, pleural effusion, or pneumothorax. A 0.6 cm subpleural nodule at the left lung base (series 6, image 74) and 0.3 cm nodule in the superior segment of the right lower lobe (series 6, image 33) are stable compared to 12/02/2020. An additional 0.4 cm solid nodule in the right upper lobe is also stable compared to 12/02/2020 (series 6, image 46). Upper Abdomen: No acute abnormality. Post surgical changes of splenectomy. Musculoskeletal: No chest wall abnormality. No acute or suspicious osseous findings. Review of the MIP images confirms the above findings. IMPRESSION: 1. No acute intrathoracic abnormality. Specifically, no pulmonary embolism as queried. 2. Multiple solid pulmonary nodules measuring up to 0.6 cm, unchanged compared to 12/02/2020. Given the stability of the nodules, follow-up noncontrast CT chest in 1 year is considered optional for low-risk patients, but is recommended for high-risk patients. This recommendation follows the consensus statement: Guidelines for Management of Incidental Pulmonary Nodules Detected on CT Images: From the Fleischner Society 2017; Radiology 2017; 284:228-243. 3. Multivessel coronary artery disease status post CABG and coronary artery stent. Aortic Atherosclerosis (ICD10-I70.0). Electronically Signed   By: LIleana RoupM.D.   On: 11/23/2021 15:38  ? ?UKoreaVenous Img Lower Bilateral (DVT) ? ?Result Date: 11/23/2021 ?CLINICAL DATA:  Positive D-dimer EXAM: BILATERAL LOWER EXTREMITY VENOUS DOPPLER ULTRASOUND TECHNIQUE: Gray-scale  sonography with compression, as well as color and duplex ultrasound, were performed to evaluate the deep venous system(s) from the level of the common femoral vein through the popliteal and proximal calf veins. COMPARISON:  None. FINDINGS: VENOUS Normal compressibility of the common femoral, superficial femoral, and popliteal veins, as well as the visualized calf veins. Visualized portions of profunda femoral vein and great saphenous vein unremarkable. No filling defects to suggest DVT on grayscale or color Doppler imaging. Doppler waveforms show normal direction of venous flow, normal respiratory plasticity and response to augmentation. Limited views of the contralateral common femoral vein are unremarkable. OTHER None. Limitations: none IMPRESSION: Negative. Electronically Signed   By: AMerilyn BabaM.D.   On: 11/23/2021 18:20   ? ? ? ? ?TELEMETRY: Normal sinus rhythm rate of 75 nonspecific ST-T changes ? ?ASSESSMENT AND PLAN: ? ?Principal Problem: ?  SOB (shortness of breath) ?Active Problems: ?  Hypothyroidism ?  Elevated troponin ?  Leukocytosis ?  HLD (hyperlipidemia) ?  Anxiety ?  CAD (coronary artery disease) ?  Tobacco abuse ?  Stage 3b chronic kidney disease [place] ?  Type II diabetes mellitus with renal manifestations ([place] ?  ? ?Plan ?Persistent chronic recurrent shortness of breath appears to be noncardiac possibly related to pulmonary disease with right hemidiaphragm paralysis with outpatient follow-up with pulmonary use incentive spirometer ?  Elevated troponins probably from demand ischemia ?Multivessel coronary disease recent catheterization in December showed widely patent stent recommend conservative management ?Outpatient follow-up with nephrology for renal insufficiency ?Continue diabetes management and control ?Agree with statin therapy for hyperlipidemia with Crestor ?Omeprazole for reflux type symptoms ?Follow-up with pulmonary as an outpatient ?Follow-up with cardiology 2 to 4 weeks Dr.  Corky Sox ? ? ?Yolonda Kida, MD ?11/24/2021 ?1:09 PM ? ? ? ?  ?

## 2021-11-24 NOTE — Discharge Summary (Signed)
?Physician Discharge Summary ?  ?Patient: Daniel Bray MRN: [id number] DOB: 1935-03-17  ?Admit date:     11/23/2021  ?Discharge date: 11/24/21  ?Discharge Physician: Daniel Bray  ? ?PCP: Jose-Mathews, Nada Boozer, MD  ? ?Recommendations at discharge:  ? ?follow-up with Daniel Bray pulmonary as outpatient as knee ?cardiology as needed ? ?Discharge Diagnoses: ?chronic shortness of breath suspect right diaphragm paralysis ? ?Hospital Course: ?Daniel Bray is a 87 y.o. male with medical history significant of pancreatic cancer, s/p pancreas resecrtion, renal cancer, s/p of left nephrectomy, s/p of partial colectomy, CAD, s/p of CABG, hypertension, hyperlipidemia, diabetes mellitus, GERD, hypothyroidism, anxiety, CKD-3B, small bowel obstruction, tobacco abuse, who presents with shortness of breath. ?Patient states that he has shortness of breath for almost 8 weeks.  It is exertional. ? ? SNIFF test by Daniel Bray on 11/16/21 ?1. Mild chronic elevation of the right diaphragm with decreased  ?diaphragmatic motion during inspiration and expiration with  ?reciprocal motion relative to the left side concerning for  ?paralysis.  ? ?CTA: ?1. No acute intrathoracic abnormality. Specifically, no pulmonary ?embolism as queried. ?2. Multiple solid pulmonary nodules measuring up to 0.6 cm, ?unchanged compared to 12/02/2020. Given the stability of the ?nodules, follow-up noncontrast CT chest in 1 year is considered ?optional for low-risk patients, but is recommended for high-risk ?patients. This recommendation follows the consensus statement: ?Guidelines for Management of Incidental Pulmonary Nodules Detected ?on CT Images: From the Fleischner Society 2017; Radiology 2017; ?284:228-243. ?3. Multivessel coronary artery disease status post  ?  ? ?shortness of breath chronic ?history of CAD status post CABG ?-- patient has ongoing exertion or shortness of breath for several weeks. Etiology unclear. D dimer positive. CT annual  negative for PE. ?-- Patient recently had been seen by Daniel Bray and had sniff test that showed right-sided diaphragmatic paralysis which may be contributing to his shortness of breath with underlying COPD and tobacco abuse ?-- patient advised smoking cessation does not appear much motivated. He smokes a cigar daily ?-- advised incentive spirometer ?-- his oxygen saturations are stable ?-- I will follow-up with Daniel Bray as needed ? ?history of coronary artery disease status post CABG ?-- no chest pain ?-- seen by [facility] cardiology ?-- continue aspirin, Crestor ? ?type II diabetes with renal manifestation ?--Recent A1c 9.6, poorly controlled.  Patient taking Jardiance, dulaglutide, glipizide ?-Sliding scale insulin ? ?Hyperlipidemia ?-- Crestor ? ?Hypothyroidism ?-- Synthroid ? ?discussed with patient and wife results of recent pulmonary testing as outpatient. They did voice understanding. Will discharged to home. Patient is agreeable. Discussed with cardiology okay for discharge. ? ? ?  ? ? ?Consultants: Daniel Bray ?Disposition: Home ?Diet recommendation:  ?Cardiac and Carb modified diet ?DISCHARGE MEDICATION: ?Allergies as of 11/24/2021   ? ?   Reactions  ? Levofloxacin Hives, Other (See Comments)  ? Levaquin [levofloxacin In D5w]   ? Weakness, low blood pressure  ? Januvia [sitagliptin] Rash  ? ?  ? ?  ?Medication List  ?  ? ?STOP taking these medications   ? ?ALPRAZolam 0.25 MG tablet ?Commonly known as: Duanne Moron ?  ?lipase/protease/amylase 36000 UNITS Cpep capsule ?Commonly known as: CREON ?  ? ?  ? ?TAKE these medications   ? ?acetaminophen 650 MG CR tablet ?Commonly known as: TYLENOL ?Take 1,300 mg by mouth every 8 (eight) hours as needed for pain. ?  ?albuterol 108 (90 Base) MCG/ACT inhaler ?Commonly known as: VENTOLIN HFA ?Inhale 2 puffs into the lungs every 2 (two) hours as  needed for wheezing or shortness of breath. ?  ?Alka-Seltzer Heartburn + Gas 750-80 MG Chew ?Generic drug: Calcium  Carbonate-Simethicone ?Chew 2 each by mouth daily as needed (gas/heartburn). ?  ?aspirin EC 81 MG tablet ?Take 81 mg by mouth daily. ?  ?carvedilol 12.5 MG tablet ?Commonly known as: COREG ?Take 12.5 mg by mouth 2 (two) times daily. ?  ?cholecalciferol 1000 units tablet ?Commonly known as: VITAMIN D ?Take 1,000 Units by mouth daily. ?  ?diclofenac Sodium 1 % Gel ?Commonly known as: VOLTAREN ?Apply 1 application topically daily as needed (pain). ?  ?Dulaglutide 3 MG/0.5ML Sopn ?Inject 3 mg into the skin every Thursday. ?  ?empagliflozin 25 MG Tabs tablet ?Commonly known as: JARDIANCE ?Take 25 mg by mouth daily. ?  ?FIBER CHOICE PO ?Take 5 tablets by mouth with breakfast, with lunch, and with evening meal. ?  ?gabapentin 300 MG capsule ?Commonly known as: NEURONTIN ?Take 300 mg by mouth at bedtime. ?  ?glipiZIDE 10 MG tablet ?Commonly known as: GLUCOTROL ?Take 10 mg by mouth 2 (two) times daily with a meal. ?  ?hydrALAZINE 25 MG tablet ?Commonly known as: APRESOLINE ?Take 25 mg by mouth 2 (two) times daily. ?  ?levocetirizine 5 MG tablet ?Commonly known as: XYZAL ?Take 5 mg by mouth every evening. ?  ?levothyroxine 75 MCG tablet ?Commonly known as: SYNTHROID ?Take 75 mcg by mouth daily before breakfast. ?  ?Melatonin 10 MG Tabs ?Take 10 mg by mouth at bedtime as needed (sleep). ?  ?omeprazole 40 MG capsule ?Commonly known as: PRILOSEC ?Take 40 mg by mouth daily. ?  ?oxymetazoline 0.05 % nasal spray ?Commonly known as: AFRIN ?Place 1 spray into both nostrils at bedtime. ?  ?PRESERVISION AREDS 2+MULTI VIT PO ?Take 1 capsule by mouth in the morning and at bedtime. ?  ?pyridoxine 100 MG tablet ?Commonly known as: B-6 ?Take 100 mg by mouth daily. ?  ?rosuvastatin 40 MG tablet ?Commonly known as: CRESTOR ?Take 40 mg by mouth daily. ?  ? ?  ? ? Follow-up Information   ? ? Jose-Mathews, Jessnie, MD. Schedule an appointment as soon as possible for a visit in 1 week(s).   ?Specialty: Family Medicine ?Contact information: ?[place] [address] ?[phone] ? ? ?  ?  ? ? Andrez Grime, MD. Go on 11/26/2021.   ?Specialty: Cardiology ?Why: on your scheduled appt ?Contact information: ?[address] ?[place] [address] ?[phone] ? ? ?  ?  ? ? Erby Pian, MD. Schedule an appointment as soon as possible for a visit.   ?Specialty: Specialist ?Why: in 1-2 weeks, If symptoms worsen ?Contact information: ?[address] ?[place] [address] ?[phone] ? ? ?  ?  ? ?  ?  ? ?  ? ?Discharge Exam: ?Filed Weights  ? 11/23/21 1700  ?Weight: 67.4 kg  ? ? ? ?Condition at discharge: fair ? ?The results of significant diagnostics from this hospitalization (including imaging, microbiology, ancillary and laboratory) are listed below for reference.  ? ?Imaging Studies: ?DG Chest 2 View ? ?Result Date: 11/23/2021 ?CLINICAL DATA:  Shortness of breath.  Right chest pain. EXAM: CHEST - 2 VIEW COMPARISON:  AP chest 12/02/2020 FINDINGS: Interval removal of enteric tube. Status post median sternotomy and CABG. Cardiac silhouette and mediastinal contours are within normal limits. Moderate calcification within the aortic arch. The lungs are clear. No pleural effusion or pneumothorax. No acute skeletal abnormality. Surgical clips overlie the left  upper abdominal quadrant. IMPRESSION: No active cardiopulmonary disease. Electronically Signed   By: Yvonne Kendall M.D.   On: 11/23/2021 11:53  ? ?CT Angio Chest PE W and/or Wo Contrast ? ?Result Date: 11/23/2021 ?CLINICAL DATA:  Several weeks of shortness of breath and weakness. Pain of the right ribs. EXAM: CT ANGIOGRAPHY CHEST WITH CONTRAST TECHNIQUE: Multidetector CT imaging of the chest was performed using the standard protocol during bolus administration of intravenous contrast. Multiplanar CT image reconstructions and MIPs were obtained to evaluate the vascular anatomy. RADIATION DOSE REDUCTION: This exam was performed according to the  departmental dose-optimization program which includes automated exposure control, adjustment of the mA and/or kV according to patient size and/or use of iterative reconstruction technique. CONTRAST:  49m OMNIPAQU

## 2021-11-24 NOTE — Plan of Care (Signed)

## 2021-11-24 NOTE — Progress Notes (Signed)
?  Transition of Care (TOC) Screening Note ? ? ?Patient Details  ?Name: Daniel Bray ?Date of Birth: 08/20/1934 ? ? ?Transition of Care (TOC) CM/SW Contact:    ?Alberteen Sam, LCSW ?Phone Number: ?11/24/2021, 9:43 AM ? ? ? ?Transition of Care Department [facility]) has reviewed patient and no TOC needs have been identified at this time. We will continue to monitor patient advancement through interdisciplinary progression rounds. If new patient transition needs arise, please place a TOC consult. ? Pricilla Riffle, [place] ?[phone] ? ?

## 2021-11-24 NOTE — Progress Notes (Signed)
Cardiology has seen the pt, ok to discharge. Discharge instructions explained to pt, verbalized understanding. IV and tele removed. Will transport off unit via wheelchair.  ?

## 2021-11-24 NOTE — Plan of Care (Signed)
?  Problem: Education: ?Goal: Knowledge of General Education information will improve ?Description: Including pain rating scale, medication(s)/side effects and non-pharmacologic comfort measures ?11/24/2021 0227 by Mamie Levers, RN ?Outcome: Progressing ?11/24/2021 0158 by Mamie Levers, RN ?Outcome: Progressing ?  ?Problem: Health Behavior/Discharge Planning: ?Goal: Ability to manage health-related needs will improve ?11/24/2021 0227 by Mamie Levers, RN ?Outcome: Progressing ?11/24/2021 0158 by Mamie Levers, RN ?Outcome: Progressing ?  ?Problem: Clinical Measurements: ?Goal: Ability to maintain clinical measurements within normal limits will improve ?11/24/2021 0227 by Mamie Levers, RN ?Outcome: Progressing ?11/24/2021 0158 by Mamie Levers, RN ?Outcome: Progressing ?Goal: Will remain free from infection ?11/24/2021 0227 by Mamie Levers, RN ?Outcome: Progressing ?11/24/2021 0158 by Mamie Levers, RN ?Outcome: Progressing ?Goal: Diagnostic test results will improve ?11/24/2021 0227 by Mamie Levers, RN ?Outcome: Progressing ?11/24/2021 0158 by Mamie Levers, RN ?Outcome: Progressing ?Goal: Respiratory complications will improve ?11/24/2021 0227 by Mamie Levers, RN ?Outcome: Progressing ?11/24/2021 0158 by Mamie Levers, RN ?Outcome: Progressing ?Goal: Cardiovascular complication will be avoided ?11/24/2021 0227 by Mamie Levers, RN ?Outcome: Progressing ?11/24/2021 0158 by Mamie Levers, RN ?Outcome: Progressing ?  ?Problem: Activity: ?Goal: Risk for activity intolerance will decrease ?11/24/2021 0227 by Mamie Levers, RN ?Outcome: Progressing ?11/24/2021 0158 by Mamie Levers, RN ?Outcome: Progressing ?  ?Problem: Nutrition: ?Goal: Adequate nutrition will be maintained ?11/24/2021 0227 by Mamie Levers, RN ?Outcome: Progressing ?11/24/2021 0158 by Mamie Levers, RN ?Outcome: Progressing ?  ?Problem: Coping: ?Goal: Level of anxiety will  decrease ?11/24/2021 0227 by Mamie Levers, RN ?Outcome: Progressing ?11/24/2021 0158 by Mamie Levers, RN ?Outcome: Progressing ?  ?Problem: Elimination: ?Goal: Will not experience complications related to bowel motility ?11/24/2021 0227 by Mamie Levers, RN ?Outcome: Progressing ?11/24/2021 0158 by Mamie Levers, RN ?Outcome: Progressing ?Goal: Will not experience complications related to urinary retention ?11/24/2021 0227 by Mamie Levers, RN ?Outcome: Progressing ?11/24/2021 0158 by Mamie Levers, RN ?Outcome: Progressing ?  ?Problem: Pain Managment: ?Goal: General experience of comfort will improve ?11/24/2021 0227 by Mamie Levers, RN ?Outcome: Progressing ?11/24/2021 0158 by Mamie Levers, RN ?Outcome: Progressing ?  ?Problem: Safety: ?Goal: Ability to remain free from injury will improve ?11/24/2021 0227 by Mamie Levers, RN ?Outcome: Progressing ?11/24/2021 0158 by Mamie Levers, RN ?Outcome: Progressing ?  ?Problem: Skin Integrity: ?Goal: Risk for impaired skin integrity will decrease ?11/24/2021 0227 by Mamie Levers, RN ?Outcome: Progressing ?11/24/2021 0158 by Mamie Levers, RN ?Outcome: Progressing ?  ?

## 2022-01-17 ENCOUNTER — Observation Stay
Admission: EM | Admit: 2022-01-17 | Discharge: 2022-01-18 | Disposition: A | Discharge status: Home / Self Care | Payer: Medicare HMO | Attending: Internal Medicine | Admitting: Internal Medicine

## 2022-01-17 ENCOUNTER — Emergency Department: Payer: Medicare HMO

## 2022-01-17 ENCOUNTER — Inpatient Hospital Stay: Payer: Medicare HMO

## 2022-01-17 DIAGNOSIS — E039 Hypothyroidism, unspecified: Secondary | ICD-10-CM | POA: Diagnosis present

## 2022-01-17 DIAGNOSIS — E1169 Type 2 diabetes mellitus with other specified complication: Secondary | ICD-10-CM | POA: Diagnosis present

## 2022-01-17 DIAGNOSIS — Z7982 Long term (current) use of aspirin: Secondary | ICD-10-CM | POA: Diagnosis not present

## 2022-01-17 DIAGNOSIS — Z85038 Personal history of other malignant neoplasm of large intestine: Secondary | ICD-10-CM | POA: Insufficient documentation

## 2022-01-17 DIAGNOSIS — I252 Old myocardial infarction: Secondary | ICD-10-CM | POA: Insufficient documentation

## 2022-01-17 DIAGNOSIS — Z7984 Long term (current) use of oral hypoglycemic drugs: Secondary | ICD-10-CM | POA: Diagnosis not present

## 2022-01-17 DIAGNOSIS — N1832 Chronic kidney disease, stage 3b: Secondary | ICD-10-CM | POA: Diagnosis not present

## 2022-01-17 DIAGNOSIS — Z85528 Personal history of other malignant neoplasm of kidney: Secondary | ICD-10-CM | POA: Diagnosis not present

## 2022-01-17 DIAGNOSIS — K219 Gastro-esophageal reflux disease without esophagitis: Secondary | ICD-10-CM | POA: Diagnosis present

## 2022-01-17 DIAGNOSIS — R7989 Other specified abnormal findings of blood chemistry: Secondary | ICD-10-CM | POA: Diagnosis present

## 2022-01-17 DIAGNOSIS — Z951 Presence of aortocoronary bypass graft: Secondary | ICD-10-CM | POA: Diagnosis not present

## 2022-01-17 DIAGNOSIS — Z8507 Personal history of malignant neoplasm of pancreas: Secondary | ICD-10-CM | POA: Insufficient documentation

## 2022-01-17 DIAGNOSIS — D72829 Elevated white blood cell count, unspecified: Secondary | ICD-10-CM | POA: Diagnosis present

## 2022-01-17 DIAGNOSIS — Z794 Long term (current) use of insulin: Secondary | ICD-10-CM | POA: Insufficient documentation

## 2022-01-17 DIAGNOSIS — E1122 Type 2 diabetes mellitus with diabetic chronic kidney disease: Secondary | ICD-10-CM | POA: Insufficient documentation

## 2022-01-17 DIAGNOSIS — I129 Hypertensive chronic kidney disease with stage 1 through stage 4 chronic kidney disease, or unspecified chronic kidney disease: Secondary | ICD-10-CM | POA: Insufficient documentation

## 2022-01-17 DIAGNOSIS — Z79899 Other long term (current) drug therapy: Secondary | ICD-10-CM | POA: Insufficient documentation

## 2022-01-17 DIAGNOSIS — E785 Hyperlipidemia, unspecified: Secondary | ICD-10-CM | POA: Diagnosis present

## 2022-01-17 DIAGNOSIS — F17290 Nicotine dependence, other tobacco product, uncomplicated: Secondary | ICD-10-CM | POA: Diagnosis not present

## 2022-01-17 DIAGNOSIS — I200 Unstable angina: Principal | ICD-10-CM

## 2022-01-17 DIAGNOSIS — R0600 Dyspnea, unspecified: Secondary | ICD-10-CM | POA: Diagnosis present

## 2022-01-17 DIAGNOSIS — R0602 Shortness of breath: Secondary | ICD-10-CM | POA: Diagnosis not present

## 2022-01-17 DIAGNOSIS — I2510 Atherosclerotic heart disease of native coronary artery without angina pectoris: Secondary | ICD-10-CM | POA: Diagnosis present

## 2022-01-17 DIAGNOSIS — R531 Weakness: Secondary | ICD-10-CM

## 2022-01-17 DIAGNOSIS — R778 Other specified abnormalities of plasma proteins: Secondary | ICD-10-CM | POA: Diagnosis not present

## 2022-01-17 LAB — URINALYSIS, ROUTINE W REFLEX MICROSCOPIC
Bacteria, UA: NONE SEEN
Bilirubin Urine: NEGATIVE
Glucose, UA: >=500 mg/dL — AB
Hgb urine dipstick: NEGATIVE
Ketones, ur: NEGATIVE mg/dL
Leukocytes,Ua: NEGATIVE
Nitrite: NEGATIVE
Protein, ur: NEGATIVE mg/dL
Specific Gravity, Urine: 1.017 (ref 1.005–1.030)
Squamous Epithelial / HPF: NONE SEEN (ref 0–5)
pH: 5 (ref 5.0–8.0)

## 2022-01-17 LAB — CBC
HCT: 43.1 % (ref 39.0–52.0)
Hemoglobin: 13.5 g/dL (ref 13.0–17.0)
MCH: 32.3 pg (ref 26.0–34.0)
MCHC: 31.3 g/dL (ref 30.0–36.0)
MCV: 103.1 fL — ABNORMAL HIGH (ref 80.0–100.0)
Platelets: 204 10*3/uL (ref 150–400)
RBC: 4.18 MIL/uL — ABNORMAL LOW (ref 4.22–5.81)
RDW: 13.7 % (ref 11.5–15.5)
WBC: 17 10*3/uL — ABNORMAL HIGH (ref 4.0–10.5)
nRBC: 0 % (ref 0.0–0.2)

## 2022-01-17 LAB — BASIC METABOLIC PANEL
Anion gap: 6 (ref 5–15)
BUN: 35 mg/dL — ABNORMAL HIGH (ref 8–23)
CO2: 24 mmol/L (ref 22–32)
Calcium: 10.1 mg/dL (ref 8.9–10.3)
Chloride: 105 mmol/L (ref 98–111)
Creatinine, Ser: 1.78 mg/dL — ABNORMAL HIGH (ref 0.61–1.24)
GFR, Estimated: 37 mL/min — ABNORMAL LOW (ref >60)
Glucose, Bld: 153 mg/dL — ABNORMAL HIGH (ref 70–99)
Potassium: 4.4 mmol/L (ref 3.5–5.1)
Sodium: 135 mmol/L (ref 135–145)

## 2022-01-17 LAB — LACTIC ACID, PLASMA: Lactic Acid, Venous: 0.9 mmol/L (ref 0.5–1.9)

## 2022-01-17 LAB — TROPONIN I (HIGH SENSITIVITY)
Troponin I (High Sensitivity): 45 ng/L — ABNORMAL HIGH (ref <18)
Troponin I (High Sensitivity): 45 ng/L — ABNORMAL HIGH (ref <18)

## 2022-01-17 LAB — PROTIME-INR
INR: 1.1 (ref 0.8–1.2)
Prothrombin Time: 14.1 seconds (ref 11.4–15.2)

## 2022-01-17 LAB — APTT: aPTT: 34 seconds (ref 24–36)

## 2022-01-17 LAB — CBG MONITORING, ED: Glucose-Capillary: 148 mg/dL — ABNORMAL HIGH (ref 70–99)

## 2022-01-17 MED ORDER — CARVEDILOL 6.25 MG PO TABS
12.5000 mg | ORAL_TABLET | Freq: Two times a day (BID) | ORAL | Status: DC
  Administered 2022-01-17: 12.5 mg via ORAL
  Filled 2022-01-17: qty 2

## 2022-01-17 MED ORDER — ASPIRIN 81 MG PO TBEC
81.0000 mg | DELAYED_RELEASE_TABLET | Freq: Every day | ORAL | Status: DC
  Administered 2022-01-17 – 2022-01-18 (×2): 81 mg via ORAL
  Filled 2022-01-17 (×2): qty 1

## 2022-01-17 MED ORDER — INSULIN ASPART 100 UNIT/ML IJ SOLN: 0.0000 [IU] | Freq: Every day | INTRAMUSCULAR | Status: DC

## 2022-01-17 MED ORDER — ONDANSETRON HCL 4 MG/2ML IJ SOLN: 4.0000 mg | Freq: Four times a day (QID) | INTRAMUSCULAR | Status: DC | PRN

## 2022-01-17 MED ORDER — ACETAMINOPHEN 500 MG PO TABS: 1000.0000 mg | ORAL_TABLET | Freq: Four times a day (QID) | ORAL | Status: DC | PRN

## 2022-01-17 MED ORDER — ALBUTEROL SULFATE (2.5 MG/3ML) 0.083% IN NEBU: 2.5000 mg | INHALATION_SOLUTION | RESPIRATORY_TRACT | Status: DC | PRN

## 2022-01-17 MED ORDER — SODIUM CHLORIDE 0.9 % IV BOLUS
500.0000 mL | Freq: Once | INTRAVENOUS | Status: AC
  Administered 2022-01-17: 500 mL via INTRAVENOUS

## 2022-01-17 MED ORDER — ROSUVASTATIN CALCIUM 20 MG PO TABS
40.0000 mg | ORAL_TABLET | Freq: Every day | ORAL | Status: DC
Start: 2022-01-17 — End: 2022-01-18
  Administered 2022-01-17: 40 mg via ORAL
  Filled 2022-01-17: qty 2

## 2022-01-17 MED ORDER — INSULIN ASPART 100 UNIT/ML IJ SOLN
0.0000 [IU] | Freq: Three times a day (TID) | INTRAMUSCULAR | Status: DC
  Filled 2022-01-17: qty 1

## 2022-01-17 MED ORDER — GABAPENTIN 300 MG PO CAPS
300.0000 mg | ORAL_CAPSULE | Freq: Every day | ORAL | Status: DC
  Administered 2022-01-17: 300 mg via ORAL
  Filled 2022-01-17: qty 1

## 2022-01-17 MED ORDER — LEVOTHYROXINE SODIUM 50 MCG PO TABS
75.0000 ug | ORAL_TABLET | Freq: Every day | ORAL | Status: DC
  Administered 2022-01-18: 75 ug via ORAL
  Filled 2022-01-17 (×2): qty 1

## 2022-01-17 MED ORDER — PANTOPRAZOLE SODIUM 40 MG PO TBEC: 80.0000 mg | DELAYED_RELEASE_TABLET | Freq: Every day | ORAL | Status: DC | PRN

## 2022-01-17 MED ORDER — HEPARIN BOLUS VIA INFUSION
4000.0000 [IU] | Freq: Once | INTRAVENOUS | Status: AC
Start: 2022-01-17 — End: 2022-01-17
  Administered 2022-01-17: 4000 [IU] via INTRAVENOUS
  Filled 2022-01-17: qty 4000

## 2022-01-17 MED ORDER — ACETAMINOPHEN 325 MG RE SUPP: 650.0000 mg | Freq: Four times a day (QID) | RECTAL | Status: DC | PRN

## 2022-01-17 MED ORDER — MELATONIN 5 MG PO TABS
10.0000 mg | ORAL_TABLET | Freq: Every evening | ORAL | Status: DC | PRN
Start: 2022-01-17 — End: 2022-01-18
  Administered 2022-01-17: 10 mg via ORAL
  Filled 2022-01-17: qty 2

## 2022-01-17 MED ORDER — HEPARIN (PORCINE) 25000 UT/250ML-% IV SOLN
800.0000 [IU]/h | INTRAVENOUS | Status: DC
  Administered 2022-01-17: 800 [IU]/h via INTRAVENOUS
  Filled 2022-01-17: qty 250

## 2022-01-17 MED ORDER — ONDANSETRON HCL 4 MG PO TABS: 4.0000 mg | ORAL_TABLET | Freq: Four times a day (QID) | ORAL | Status: DC | PRN

## 2022-01-17 MED ORDER — HYDRALAZINE HCL 50 MG PO TABS: 25.0000 mg | ORAL_TABLET | Freq: Two times a day (BID) | ORAL | Status: DC

## 2022-01-17 NOTE — Hospital Course (Addendum)
[name] is a 87 year old male pancreatic cancer status post pancreatic resection, renal cancer status post left nephrectomy, status post partial colectomy, CAD, history of hairy cell leukemia in remission, chronic leukocytosis, status post CABG, hypertension, hyperlipidemia, diabetes mellitus, GERD, hypothyroid, anxiety, CKD 3B, small bowel obstruction, tobacco abuse, who presents emergency department for chief concerns of low blood pressure weakness and fatigue.  Initial vitals in the emergency department showed temperature of 98, respiration rate of 17, heart rate 83, blood pressure 100/67, improved to 156/98, SPO2 96% on room air.  Serum sodium is 135, potassium 4.4, chloride 105, bicarb 24, BUN of 35, serum creatinine 1.78, nonfasting blood glucose 153, GFR 37, WBC 17, hemoglobin 13.5, platelets of 204.  High sensitive troponin was 45.  Lactic acid was 0.9.  ED treatment: Heparin GTT, sodium chloride 500 mL bolus.

## 2022-01-17 NOTE — Consult Note (Signed)
ANTICOAGULATION CONSULT NOTE - Initial Consult  Pharmacy Consult for heparin infusion Indication: chest pain/ACS  Allergies  Allergen Reactions   Levofloxacin Hives and Other (See Comments)   Levaquin [Levofloxacin In D5w]     Weakness, low blood pressure   Januvia [Sitagliptin] Rash    Patient Measurements: Height: '5\' 10"'$  (177.8 cm) Weight: 67.4 kg (148 lb 9.4 oz) IBW/kg (Calculated) : 73 Heparin Dosing Weight: 67.4 kg   Vital Signs: Temp: 98 F (36.7 C) (06/15 1404) Temp Source: Oral (06/15 1404) BP: 128/80 (06/15 1700) Pulse Rate: 68 (06/15 1700)  Labs: Recent Labs    01/17/22 1405 01/17/22 1616  HGB 13.5  --   HCT 43.1  --   PLT 204  --   CREATININE 1.78*  --   TROPONINIHS  --  45*    Estimated Creatinine Clearance: 29.5 mL/min (A) (by C-G formula based on SCr of 1.78 mg/dL (H)).   Medical History: Past Medical History:  Diagnosis Date   Cancer ([place])    kidney   Cancer ([place])    pancreas   Cancer ([place])    colon    Diabetes mellitus without complication ([place])    Hyperlipidemia    Hypertension    Myocardial infarction ([place])    Streptococcal infection    Strep [place]   Thyroid disease     Medications:  No prior AC noted   Assessment: 87 y.o. male history of CAD presents to the ER for evaluation of exertional dyspnea and episode of hypotension. Initial troponin I elevated at 45. Pharmacy has been consulted to initiate and manage heparin infusion.  Goal of Therapy:  Heparin level 0.3-0.7 units/ml Monitor platelets by anticoagulation protocol: Yes   Plan:  Give 4000 units bolus x 1 Start heparin infusion at 800 units/hr Check anti-Xa level in 8 hours  Continue to monitor H&H and platelets  Dorothe Pea, PharmD, BCPS Clinical Pharmacist   01/17/2022,5:50 PM

## 2022-01-17 NOTE — Assessment & Plan Note (Signed)
-   Continue heparin GTT, cardiology has been consulted

## 2022-01-17 NOTE — ED Provider Notes (Signed)
[facility] Provider Note    Event Date/Time   First MD Initiated Contact with Patient 01/17/22 1545     (approximate)   History   Hypotension   HPI  Daniel Bray is a 87 y.o. male history of CAD presents to the ER for evaluation of exertional dyspnea and episode of hypotension to systolics of 80 this morning.  Having exertional dyspnea that is worsening over the past several weeks.  Has planned outpatient cardiac cath but it seems that his symptoms are worsening.  Has decreased his blood pressure medication but still feeling very weak and fatigued.  Has had extensive work-up for similar symptoms over the past few months including negative CTAs to rule out PE.  He has been compliant with his medications.  Denies any chest pain or pressure at rest.  Denies any fevers.     Physical Exam   Triage Vital Signs: ED Triage Vitals [01/17/22 1404]  Enc Vitals Group     BP 100/67     Pulse Rate 83     Resp 17     Temp 98 F (36.7 C)     Temp Source Oral     SpO2 96 %     Weight      Height '5\' 10"'$  (1.778 m)     Head Circumference      Peak Flow      Pain Score 0     Pain Loc      Pain Edu?      Excl. in [place]?     Most recent vital signs: Vitals:   01/17/22 1630 01/17/22 1700  BP: 132/75 128/80  Pulse: 70 68  Resp: 19 12  Temp:    SpO2: 98% 96%     Constitutional: Alert  Eyes: Conjunctivae are normal.  Head: Atraumatic. Nose: No congestion/rhinnorhea. Mouth/Throat: Mucous membranes are moist.   Neck: Painless ROM.  Cardiovascular:   Good peripheral circulation.  No m/g/r Respiratory: Normal respiratory effort.  No retractions.  Gastrointestinal: Soft and nontender.  Musculoskeletal:  no deformity Neurologic:  MAE spontaneously. No gross focal neurologic deficits are appreciated.  Skin:  Skin is warm, dry and intact. No rash noted. Psychiatric: Mood and affect are normal. Speech and behavior are normal.    ED Results / Procedures /  Treatments   Labs (all labs ordered are listed, but only abnormal results are displayed) Labs Reviewed  BASIC METABOLIC PANEL - Abnormal; Notable for the following components:      Result Value   Glucose, Bld 153 (*)    BUN 35 (*)    Creatinine, Ser 1.78 (*)    GFR, Estimated 37 (*)    All other components within normal limits  CBC - Abnormal; Notable for the following components:   WBC 17.0 (*)    RBC 4.18 (*)    MCV 103.1 (*)    All other components within normal limits  URINALYSIS, ROUTINE W REFLEX MICROSCOPIC - Abnormal; Notable for the following components:   Color, Urine YELLOW (*)    APPearance CLEAR (*)    Glucose, UA >=500 (*)    All other components within normal limits  TROPONIN I (HIGH SENSITIVITY) - Abnormal; Notable for the following components:   Troponin I (High Sensitivity) 45 (*)    All other components within normal limits  LACTIC ACID, PLASMA  LACTIC ACID, PLASMA  CBG MONITORING, ED  TROPONIN I (HIGH SENSITIVITY)     EKG  ED ECG REPORT  I, Merlyn Lot, the attending physician, personally viewed and interpreted this ECG.   Date: 01/17/2022  EKG Time: 14:10  Rate: 85  Rhythm: sinus  Axis: left  Intervals: normal qt  ST&T Change: nonspecific st abn, no stemi unchanged from 11/23/21    RADIOLOGY Please see ED Course for my review and interpretation.  I personally reviewed all radiographic images ordered to evaluate for the above acute complaints and reviewed radiology reports and findings.  These findings were personally discussed with the patient.  Please see medical record for radiology report.    PROCEDURES:  Critical Care performed: Yes, see critical care procedure note(s)  Procedures   MEDICATIONS ORDERED IN ED: Medications  sodium chloride 0.9 % bolus 500 mL (500 mLs Intravenous New Bag/Given 01/17/22 1624)     IMPRESSION / MDM / ASSESSMENT AND PLAN / ED COURSE  I reviewed the triage vital signs and the nursing notes.                               Differential diagnosis includes, but is not limited to, Asthma, acs, copd, CHF, pna, ptx, malignancy, Pe, anemia  Presented to the ER for evaluation of symptoms as described above.  Patient reportedly hypotensive with extensive cardiac history with plan for outpatient cardiac cath coming up but having worsening symptoms.  This presenting complaint could reflect a potentially life-threatening illness therefore the patient will be placed on continuous pulse oximetry and telemetry for monitoring.  Laboratory evaluation will be sent to evaluate for the above complaints.     Clinical Course as of 01/17/22 1744  Thu Jan 17, 2022  1615 Patient with borderline renal insufficiency that appears at his baseline also with chronic leukocytosis. [PR]  1732 Troponin is mildly elevated no source of infection.  Does not meet septic criteria lactate is normal.   Will heparinize given concern for unstable angina as suspect cardiogenic etiology. will consult cardiology as well as hospitalist for admission. [PR]    Clinical Course User Index [PR] Merlyn Lot, MD    Patient's presentation is most consistent with acute presentation with potential threat to life or bodily function.   FINAL CLINICAL IMPRESSION(S) / ED DIAGNOSES   Final diagnoses:  Unstable angina ([place])     Rx / DC Orders   ED Discharge Orders     None        Note:  This document was prepared using Dragon voice recognition software and may include unintentional dictation errors.    Merlyn Lot, MD 01/17/22 1745

## 2022-01-17 NOTE — Assessment & Plan Note (Signed)
-   Rosuvastatin 40 mg daily 

## 2022-01-17 NOTE — ED Notes (Signed)
[place] in room with pt

## 2022-01-17 NOTE — Assessment & Plan Note (Signed)
-   Levothyroxine 75 mcg daily before breakfast 

## 2022-01-17 NOTE — Assessment & Plan Note (Addendum)
-   Patient has chronic leukocytosis, no reported symptoms of infectious etiology at this time - Low clinical suspicion for infectious etiology therefore no indications for antibiotic - CBC in the a.m.

## 2022-01-17 NOTE — Assessment & Plan Note (Signed)
-   Non-insulin-dependent - Holding home glipizide 10 mg p.o. twice daily, Jardiance 25 mg daily - Insulin SSI with issues coverage ordered - Goal inpatient blood glucose levels 140-180

## 2022-01-17 NOTE — Assessment & Plan Note (Signed)
-   Resumed aspirin 81 mg daily, carvedilol 12.5 mg p.o. twice daily, rosuvastatin

## 2022-01-17 NOTE — Assessment & Plan Note (Signed)
At baseline 

## 2022-01-17 NOTE — ED Notes (Signed)
Pt asked for PRN medication. Medication provided and urinal cleaned at this time

## 2022-01-17 NOTE — Assessment & Plan Note (Signed)
-   Dyspnea with exertion with associated weakness with exertion - Admit to progressive cardiac, inpatient - Heparin GTT - Cardiology has been consulted for NSTEMI, Dr. Clayborn Bigness states he will see the patient - N.p.o. after midnight

## 2022-01-17 NOTE — ED Triage Notes (Signed)
Patient to ER via Pov with wife, reports that his blood pressure was 80 systolic this morning. Did take his BP medications this morning. States he has been feeling extremely weak and fatigued. Significant cardiac history. States he has a heart cath scheduled next Thursday. Denies chest pain or SHOB.

## 2022-01-17 NOTE — H&P (Signed)
History and Physical   Daniel Bray QJF:[id number] DOB: 1935-01-01 DOA: 01/17/2022  PCP: Elza Rafter, MD  Outpatient Specialists: Dr. Burnett Kanaris clinic cardiology Patient coming from: Home  I have personally briefly reviewed patient's old medical records in [place].  Chief Concern: Dyspnea with exertion and increasing weakness  HPI: [name] is a 87 year old male pancreatic cancer status post pancreatic resection, renal cancer status post left nephrectomy, status post partial colectomy, CAD, history of hairy cell leukemia in remission, chronic leukocytosis, status post CABG, hypertension, hyperlipidemia, diabetes mellitus, GERD, hypothyroid, anxiety, CKD 3B, small bowel obstruction, tobacco abuse, who presents emergency department for chief concerns of low blood pressure weakness and fatigue.  Initial vitals in the emergency department showed temperature of 98, respiration rate of 17, heart rate 83, blood pressure 100/67, improved to 156/98, SPO2 96% on room air.  Serum sodium is 135, potassium 4.4, chloride 105, bicarb 24, BUN of 35, serum creatinine 1.78, nonfasting blood glucose 153, GFR 37, WBC 17, hemoglobin 13.5, platelets of 204.  High sensitive troponin was 45.  Lactic acid was 0.9.  ED treatment: Heparin GTT, sodium chloride 500 mL bolus.  At bedside patient is able to tell me his full name, age, current location of hospital.  He reports he is having worsening dyspnea with exertion for 2 months now.  He further endorses that he has been increasingly weak over the last few days prompting him to present to the emergency department for further evaluation.  He denies chest pain, abdominal pain, dysuria, diarrhea, syncope, loss of consciousness, hematuria.  He reports he is also having worsening swelling in his lower extremities.  He denies fever, cough, chills, skin rashes on his body.  Social history: He denies tobacco, EtOH, recreational  drug use.  He is retired and formerly worked in [employer] in the [employer].  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  ROS: Constitutional: no weight change, no fever ENT/Mouth: no sore throat, no rhinorrhea Eyes: no eye pain, no vision changes Cardiovascular: no chest pain, + dyspnea,  + edema, no palpitations Respiratory: no cough, no sputum, no wheezing Gastrointestinal: no nausea, no vomiting, no diarrhea, no constipation Genitourinary: no urinary incontinence, no dysuria, no hematuria Musculoskeletal: no arthralgias, no myalgias Skin: no skin lesions, no pruritus, Neuro: + weakness, no loss of consciousness, no syncope Psych: no anxiety, no depression, no decrease appetite Heme/Lymph: no bruising, no bleeding  ED Course: Discussed with emergency medicine provider, patient requiring hospitalization for chief concerns of NSTEMI.  Concerning for ACS  Assessment/Plan  Principal Problem:   SOB (shortness of breath) Active Problems:   Elevated troponin   CAD (coronary artery disease)   Type 2 diabetes mellitus with hyperlipidemia (HCC)   History of renal cell carcinoma s/p nephrectomy   Hypothyroidism   Leukocytosis   HLD (hyperlipidemia)   GERD (gastroesophageal reflux disease)   Stage 3b chronic kidney disease (HCC)   Hx of CABG   Weakness   Assessment and Plan:  * SOB (shortness of breath) - Dyspnea with exertion with associated weakness with exertion - Admit to progressive cardiac, inpatient - Heparin GTT - Cardiology has been consulted for NSTEMI, Dr. Clayborn Bigness states he will see the patient - N.p.o. after midnight  Elevated troponin - Continue heparin GTT, cardiology has been consulted  Hx of CABG - Resumed aspirin 81 mg daily, carvedilol 12.5 mg p.o. twice daily, rosuvastatin  Stage 3b chronic kidney disease ([place]) - At baseline  HLD (hyperlipidemia) - Rosuvastatin  40 mg daily  Leukocytosis - Patient has chronic leukocytosis, no reported  symptoms of infectious etiology at this time - Low clinical suspicion for infectious etiology therefore no indications for antibiotic - CBC in the a.m.  Hypothyroidism - Levothyroxine 75 mcg daily before breakfast  Type 2 diabetes mellitus with hyperlipidemia (HCC) - Non-insulin-dependent - Holding home glipizide 10 mg p.o. twice daily, Jardiance 25 mg daily - Insulin SSI with issues coverage ordered - Goal inpatient blood glucose levels 140-180  Chart reviewed.   08/03/2021: Coronary artery disease including chronic total occlusion of the ostial LAD, moderate LMCA disease, 90% of OM 2 stenosis.  Patent bypass graft including SVG to D1, SVG to OM 2, LIMA to LAD.  Limited echo on 12/10/2021: Estimated ejection fraction 40%.  Hypocontractile in the anterior, septal, apical, inferior, posterior heart.  Moderate LVH concentric.  Moderate LV systolic dysfunction.  Normal right ventricular systolic function.  Patient has planned outpatient RIGHT heart cath for 01/24/2022 with [facility] cardi[facility]  DVT prophylaxis: Heparin GTT Code Status: Full code Diet: Heart healthy now, n.p.o. after midnight Family Communication: Updated daughter at bedside with patient's permission Disposition Plan: Pending clinical course Consults called: Cardiology Admission status: Inpatient, progressive cardiac  Past Medical History:  Diagnosis Date   Cancer ([place])    kidney   Cancer ([place])    pancreas   Cancer ([place])    colon    Diabetes mellitus without complication ([place])    Hyperlipidemia    Hypertension    Myocardial infarction ([place])    Streptococcal infection    Strep Bovis   Thyroid disease    Past Surgical History:  Procedure Laterality Date   CARDIAC ELECTROPHYSIOLOGY STUDY AND ABLATION     CARDIAC SURGERY     CORONARY ANGIOPLASTY WITH STENT PLACEMENT     CORONARY ARTERY BYPASS GRAFT     x3   LEFT HEART CATH AND CORONARY ANGIOGRAPHY N/A 08/03/2021   Procedure: LEFT HEART CATH AND  CORONARY ANGIOGRAPHY;  Surgeon: Andrez Grime, MD;  Location: [facility];  Service: Cardiovascular;  Laterality: N/A;   PANCREAS SURGERY     SPLENECTOMY     Social History:  reports that he has been smoking cigars. His smokeless tobacco use includes chew. He reports that he does not drink alcohol and does not use drugs.  Allergies  Allergen Reactions   Levofloxacin Hives and Other (See Comments)   Levaquin [Levofloxacin In D5w]     Weakness, low blood pressure   Januvia [Sitagliptin] Rash   Family History  Problem Relation Age of Onset   Pancreatic cancer Mother    Liver cancer Mother    Family history: Family history reviewed and not pertinent.  Prior to Admission medications   Medication Sig Start Date End Date Taking? Authorizing Provider  acetaminophen (TYLENOL) 650 MG CR tablet Take 1,300 mg by mouth every 8 (eight) hours as needed for pain.   Yes [provider]  albuterol (VENTOLIN HFA) 108 (90 Base) MCG/ACT inhaler Inhale 2 puffs into the lungs every 2 (two) hours as needed for wheezing or shortness of breath. 11/24/21  Yes Fritzi Mandes, MD  aspirin EC 81 MG tablet Take 81 mg by mouth daily.   Yes [provider]  carvedilol (COREG) 12.5 MG tablet Take 12.5 mg by mouth 2 (two) times daily. 08/27/21  Yes [provider]  cholecalciferol (VITAMIN D) 1000 units tablet Take 1,000 Units by mouth daily.   Yes [provider]  empagliflozin (  JARDIANCE) 25 MG TABS tablet Take 25 mg by mouth daily.   Yes [provider]  gabapentin (NEURONTIN) 300 MG capsule Take 300 mg by mouth at bedtime.   Yes [provider]  glipiZIDE (GLUCOTROL) 10 MG tablet Take 10 mg by mouth 2 (two) times daily with a meal.    Yes [provider]  hydrALAZINE (APRESOLINE) 25 MG tablet Take 25 mg by mouth 2 (two) times daily.   Yes [provider]  Inulin (FIBER CHOICE PO) Take 5 tablets by mouth with breakfast, with lunch, and  with evening meal.   Yes [provider]  levocetirizine (XYZAL) 5 MG tablet Take 5 mg by mouth every evening. 11/27/20  Yes [provider]  levothyroxine (SYNTHROID) 75 MCG tablet Take 75 mcg by mouth daily before breakfast.    Yes [provider]  Melatonin 10 MG TABS Take 10 mg by mouth at bedtime as needed (sleep).   Yes [provider]  Multiple Vitamins-Minerals (PRESERVISION AREDS 2+MULTI VIT PO) Take 1 capsule by mouth in the morning and at bedtime.   Yes [provider]  oxymetazoline (AFRIN) 0.05 % nasal spray Place 1 spray into both nostrils at bedtime.   Yes [provider]  pyridoxine (B-6) 100 MG tablet Take 100 mg by mouth daily.   Yes [provider]  rosuvastatin (CRESTOR) 40 MG tablet Take 40 mg by mouth daily.   Yes [provider]  Calcium Carbonate-Simethicone (ALKA-SELTZER HEARTBURN + GAS) 750-80 MG CHEW Chew 2 each by mouth daily as needed (gas/heartburn).    [provider]  diclofenac Sodium (VOLTAREN) 1 % GEL Apply 1 application topically daily as needed (pain).    [provider]  Dulaglutide 3 MG/0.5ML SOPN Inject 3 mg into the skin every Monday.    [provider]  omeprazole (PRILOSEC) 40 MG capsule Take 40 mg by mouth daily.     [provider]   Physical Exam: Vitals:   01/17/22 1404 01/17/22 1532 01/17/22 1630 01/17/22 1700  BP: 100/67 110/70 132/75 128/80  Pulse: 83 73 70 68  Resp: '17 18 19 12  '$ Temp: 98 F (36.7 C)     TempSrc: Oral     SpO2: 96% 96% 98% 96%  Weight:    67.4 kg  Height: '5\' 10"'$  (1.778 m)      Constitutional: appears age-appropriate, NAD, calm, comfortable Eyes: PERRL, lids and conjunctivae normal ENMT: Mucous membranes are moist. Posterior pharynx clear of any exudate or lesions. Age-appropriate dentition. Hearing appropriate Neck: normal, supple, no masses, no thyromegaly Respiratory: clear to auscultation bilaterally, no  wheezing, no crackles. Normal respiratory effort. No accessory muscle use.  Cardiovascular: Regular rate and rhythm, no murmurs / rubs / gallops. No extremity edema. 2+ pedal pulses. No carotid bruits.  Abdomen: no tenderness, no masses palpated, no hepatosplenomegaly. Bowel sounds positive.  Musculoskeletal: no clubbing / cyanosis. No joint deformity upper and lower extremities. Good ROM, no contractures, no atrophy. Normal muscle tone.  Skin: no rashes, lesions, ulcers. No induration Neurologic: Sensation intact. Strength 5/5 in all 4.  Psychiatric: Normal judgment and insight. Alert and oriented x 3. Normal mood.   EKG: independently reviewed, showing sinus rhythm with rate of 84, QTc 475  Chest x-ray on Admission: I personally reviewed and I agree with radiologist reading as below.  DG Chest Portable 1 View  Result Date: 01/17/2022 CLINICAL DATA:  Hypotension.  Question pulmonary edema. EXAM: PORTABLE CHEST 1 VIEW COMPARISON:  11/23/2021 FINDINGS:  Previous median sternotomy and CABG. Heart size within normal limits. Chronic aortic atherosclerotic calcification. Patient has taken a poor inspiration. Allowing for that, the lungs are clear. No edema or effusion. Mild chronic pleural scarring on the left. IMPRESSION: Previous CABG.  Poor inspiration.  No active disease by radiography. Electronically Signed   By: Nelson Chimes M.D.   On: 01/17/2022 16:07    Labs on Admission: I have personally reviewed following labs  CBC: Recent Labs  Lab 01/17/22 1405  WBC 17.0*  HGB 13.5  HCT 43.1  MCV 103.1*  PLT 626   Basic Metabolic Panel: Recent Labs  Lab 01/17/22 1405  NA 135  K 4.4  CL 105  CO2 24  GLUCOSE 153*  BUN 35*  CREATININE 1.78*  CALCIUM 10.1   GFR: Estimated Creatinine Clearance: 29.5 mL/min (A) (by C-G formula based on SCr of 1.78 mg/dL (H)).  Coagulation Profile: Recent Labs  Lab 01/17/22 1755  INR 1.1   Urine analysis:    Component Value Date/Time   COLORURINE  YELLOW (A) 01/17/2022 1616   APPEARANCEUR CLEAR (A) 01/17/2022 1616   LABSPEC 1.017 01/17/2022 1616   PHURINE 5.0 01/17/2022 1616   GLUCOSEU >=500 (A) 01/17/2022 1616   HGBUR NEGATIVE 01/17/2022 1616   BILIRUBINUR NEGATIVE 01/17/2022 1616   KETONESUR NEGATIVE 01/17/2022 1616   PROTEINUR NEGATIVE 01/17/2022 1616   NITRITE NEGATIVE 01/17/2022 1616   LEUKOCYTESUR NEGATIVE 01/17/2022 1616   Dr. Tobie Poet [facility]  If 7PM-7AM, please contact overnight-coverage provider If 7AM-7PM, please contact day coverage provider [email or web address]  01/17/2022, 8:30 PM

## 2022-01-17 NOTE — ED Notes (Signed)
[place] out of room

## 2022-01-18 ENCOUNTER — Encounter: Payer: Self-pay | Admitting: Internal Medicine

## 2022-01-18 ENCOUNTER — Other Ambulatory Visit: Payer: Self-pay

## 2022-01-18 ENCOUNTER — Encounter
Admission: EM | Disposition: A | Discharge status: Home / Self Care | Payer: Self-pay | Source: Home / Self Care | Attending: Student in an Organized Health Care Education/Training Program

## 2022-01-18 ENCOUNTER — Inpatient Hospital Stay
Admit: 2022-01-18 | Discharge: 2022-01-18 | Disposition: A | Discharge status: Home / Self Care | Payer: Medicare HMO | Attending: Cardiology | Admitting: Cardiology

## 2022-01-18 DIAGNOSIS — R0602 Shortness of breath: Secondary | ICD-10-CM | POA: Diagnosis not present

## 2022-01-18 HISTORY — PX: RIGHT HEART CATH: CATH118263

## 2022-01-18 LAB — ECHOCARDIOGRAM COMPLETE
AR max vel: 2.61 cm2
AV Area VTI: 2.61 cm2
AV Area mean vel: 2.25 cm2
AV Mean grad: 2 mmHg
AV Peak grad: 2.6 mmHg
Ao pk vel: 0.81 m/s
Area-P 1/2: 6.48 cm2
Calc EF: 40.2 %
Height: 70 in
S' Lateral: 3.65 cm
Single Plane A2C EF: 39.5 %
Single Plane A4C EF: 39.9 %
Weight: 2377.44 oz

## 2022-01-18 LAB — CBC
HCT: 42.3 % (ref 39.0–52.0)
Hemoglobin: 13.3 g/dL (ref 13.0–17.0)
MCH: 32.4 pg (ref 26.0–34.0)
MCHC: 31.4 g/dL (ref 30.0–36.0)
MCV: 102.9 fL — ABNORMAL HIGH (ref 80.0–100.0)
Platelets: 202 10*3/uL (ref 150–400)
RBC: 4.11 MIL/uL — ABNORMAL LOW (ref 4.22–5.81)
RDW: 13.7 % (ref 11.5–15.5)
WBC: 19.7 10*3/uL — ABNORMAL HIGH (ref 4.0–10.5)
nRBC: 0 % (ref 0.0–0.2)

## 2022-01-18 LAB — CBG MONITORING, ED
Glucose-Capillary: 169 mg/dL — ABNORMAL HIGH (ref 70–99)
Glucose-Capillary: 119 mg/dL — ABNORMAL HIGH (ref 70–99)

## 2022-01-18 LAB — HEPARIN LEVEL (UNFRACTIONATED): Heparin Unfractionated: 0.64 IU/mL (ref 0.30–0.70)

## 2022-01-18 LAB — LACTIC ACID, PLASMA: Lactic Acid, Venous: 0.9 mmol/L (ref 0.5–1.9)

## 2022-01-18 LAB — BRAIN NATRIURETIC PEPTIDE: B Natriuretic Peptide: 337.5 pg/mL — ABNORMAL HIGH (ref 0.0–100.0)

## 2022-01-18 SURGERY — RIGHT HEART CATH
Anesthesia: Moderate Sedation

## 2022-01-18 MED ORDER — MIDAZOLAM HCL 2 MG/2ML IJ SOLN
INTRAMUSCULAR | Status: DC | PRN
  Administered 2022-01-18: 1 mg via INTRAVENOUS

## 2022-01-18 MED ORDER — FUROSEMIDE 20 MG PO TABS: 20.0000 mg | ORAL_TABLET | Freq: Every day | ORAL | 0 refills | Status: DC

## 2022-01-18 MED ORDER — SODIUM CHLORIDE 0.9% FLUSH: 3.0000 mL | INTRAVENOUS | Status: DC | PRN

## 2022-01-18 MED ORDER — LIDOCAINE HCL (PF) 1 % IJ SOLN
INTRAMUSCULAR | Status: DC | PRN
  Administered 2022-01-18: 4 mL

## 2022-01-18 MED ORDER — FUROSEMIDE 20 MG PO TABS
20.0000 mg | ORAL_TABLET | Freq: Every day | ORAL | Status: DC
  Filled 2022-01-18: qty 1

## 2022-01-18 MED ORDER — CARVEDILOL 6.25 MG PO TABS
6.2500 mg | ORAL_TABLET | Freq: Two times a day (BID) | ORAL | Status: DC
  Administered 2022-01-18: 6.25 mg via ORAL
  Filled 2022-01-18: qty 1

## 2022-01-18 MED ORDER — LIDOCAINE HCL 1 % IJ SOLN
INTRAMUSCULAR | Status: AC
  Filled 2022-01-18: qty 20

## 2022-01-18 MED ORDER — FENTANYL CITRATE (PF) 100 MCG/2ML IJ SOLN
INTRAMUSCULAR | Status: DC | PRN
Start: 2022-01-18 — End: 2022-01-18
  Administered 2022-01-18: 25 ug via INTRAVENOUS

## 2022-01-18 MED ORDER — SODIUM CHLORIDE 0.9 % IV SOLN: INTRAVENOUS | Status: DC

## 2022-01-18 MED ORDER — FENTANYL CITRATE (PF) 100 MCG/2ML IJ SOLN
INTRAMUSCULAR | Status: AC
  Filled 2022-01-18: qty 2

## 2022-01-18 MED ORDER — HEPARIN (PORCINE) IN NACL 1000-0.9 UT/500ML-% IV SOLN
INTRAVENOUS | Status: DC | PRN
  Administered 2022-01-18 (×2): 500 mL

## 2022-01-18 MED ORDER — SODIUM CHLORIDE 0.9 % IV SOLN: 250.0000 mL | INTRAVENOUS | Status: DC | PRN

## 2022-01-18 MED ORDER — CARVEDILOL 6.25 MG PO TABS
6.2500 mg | ORAL_TABLET | Freq: Two times a day (BID) | ORAL | 1 refills | Status: DC
Start: 2022-01-18 — End: 2023-10-22

## 2022-01-18 MED ORDER — MIDAZOLAM HCL 2 MG/2ML IJ SOLN
INTRAMUSCULAR | Status: AC
  Filled 2022-01-18: qty 2

## 2022-01-18 MED ORDER — SODIUM CHLORIDE 0.9% FLUSH
3.0000 mL | Freq: Two times a day (BID) | INTRAVENOUS | Status: DC
  Administered 2022-01-18: 3 mL via INTRAVENOUS

## 2022-01-18 SURGICAL SUPPLY — 7 items
CATH BALLN WEDGE 5F 110CM (CATHETERS) ×1 IMPLANT
DRAPE BRACHIAL (DRAPES) ×1 IMPLANT
GUIDEWIRE .025 260CM (WIRE) ×1 IMPLANT
GUIDEWIRE EMER 3M J .025X150CM (WIRE) ×1 IMPLANT
KIT RIGHT HEART ACIST (MISCELLANEOUS) ×1 IMPLANT
PACK CARDIAC CATH (CUSTOM PROCEDURE TRAY) ×2 IMPLANT
SHEATH GLIDE SLENDER 4/5FR (SHEATH) ×1 IMPLANT

## 2022-01-18 NOTE — ED Notes (Signed)
Pt and family updated. Pt informed to removed all clothing besides hospital gown and socks for procedure.

## 2022-01-18 NOTE — Consult Note (Signed)
[facility] NOTE       Patient ID: Daniel Bray MRN: [id number] DOB/AGE: [birth year] 87 y.o.  Admit date: 01/17/2022 Referring Physician Dr. Rupert Stacks Primary Physician Dr. Elza Rafter Primary Cardiologist Dr. Donnelly Angelica Reason for Consultation elevated troponin   HPI: Daniel Bray is an 87yoM with a PMH of CAD s/p CABG x3 in 10/2013 (LIMA to LAD, SVG to OM1 and D1), ICM with recovered EF (40% by echo 12/2021) s/p ICD 2009, PVC/VT ablation 2017, type 2 diabetes, hypertension, hyperlipidemia, hypothyroidism, metastatic clear-cell RCC s/p left nephrectomy, pancreatic cancer s/p resection, s/p partial colectomy, hairy cell leukemia in remission, tobacco abuse who presented to [facility] ED 01/17/2022 with weakness, fatigue, and low blood pressure.  Cardiology is consulted because of his elevated troponin.  He has been followed closely by his cardiologist (recently seen 5/25), pulmonologist (seen 5/16), and primary care physician and has had worsening exertional shortness of breath, even getting winded with doing things around the house and even with talking sometimes, but was not having any other heart failure symptoms like peripheral edema, orthopnea, PND.  He was planned for right heart cath with Dr. Corky Sox on 6/22 to further characterize his shortness of breath, measure his right heart pressures and cardiac output and consider repeat cMRI to rule out an infiltrative process.   He presents to ED today with his wife.  He says he has been feeling generally weak over the past 4 months with exertional shortness of breath is fairly unusual for him.  He was seen by pulmonology 1 month ago and was given a sample for Spiriva which he took for a month which he did not really think helped much.  Over the past week he has had no energy at all, although he was weed eating and mowing the lawn yesterday but had to take a lot of breaks doing so.  When he came inside he felt like he  could pass out, checked his blood pressure and it was 87/43.  He called our office at [facility] clinic and was advised to come to the ED.  He notably denied chest pain or jaw pain (prior anginal equivalent), orthopnea, or true loss of consciousness.  He had occasional dependent left foot swelling resolves with elevation of his legs.  He otherwise been feeling okay other than some allergy symptoms worse with spending time outside, denying fever, chills, cough.  Initial vitals in the ED showed a blood pressure of 100/67 which improved to 156/98 after a 500 mL bolus of NS on repeats.  SPO2 96% on room air, heart rate 83.  labs are notable for a potassium of 4.4, BUN/creatinine 35/1.78, EGFR 37.  (Slightly worse than baseline at 1.45/45 on 5/25) high-sensitivity troponin slightly elevated and flat trending at 45-45 (slightly lower than in April 2023 when it was 58-65.)  BNP slightly elevated at 337.5 (compared to 1 month ago at 258). persistent leukocytosis uptrending 17-19. Chest x-ray with poor inspiratory effort without active disease by radiography.   Review of systems complete and found to be negative unless listed above     Past Medical History:  Diagnosis Date   Cancer ([place])    kidney   Cancer ([place])    pancreas   Cancer ([place])    colon    Diabetes mellitus without complication ([place])    Hyperlipidemia    Hypertension    Myocardial infarction ([place])    Streptococcal infection    Strep Bovis   Thyroid disease  Past Surgical History:  Procedure Laterality Date   CARDIAC ELECTROPHYSIOLOGY STUDY AND ABLATION     CARDIAC SURGERY     CORONARY ANGIOPLASTY WITH STENT PLACEMENT     CORONARY ARTERY BYPASS GRAFT     x3   LEFT HEART CATH AND CORONARY ANGIOGRAPHY N/A 08/03/2021   Procedure: LEFT HEART CATH AND CORONARY ANGIOGRAPHY;  Surgeon: Andrez Grime, MD;  Location: [place] CV LAB;  Service: Cardiovascular;  Laterality: N/A;   PANCREAS SURGERY     SPLENECTOMY      (Not in a  hospital admission)  Social History   Socioeconomic History   Marital status: Married    Spouse name: Not on file   Number of children: Not on file   Years of education: Not on file   Highest education level: Not on file  Occupational History   Not on file  Tobacco Use   Smoking status: Every Day    Types: Cigars   Smokeless tobacco: Current    Types: Chew  Vaping Use   Vaping Use: Never used  Substance and Sexual Activity   Alcohol use: No   Drug use: No   Sexual activity: Not Currently  Other Topics Concern   Not on file  Social History Narrative   Not on file   Social Determinants of Health   Financial Resource Strain: Not on file  Food Insecurity: Not on file  Transportation Needs: Not on file  Physical Activity: Not on file  Stress: Not on file  Social Connections: Not on file  Intimate Partner Violence: Not on file    Family History  Problem Relation Age of Onset   Pancreatic cancer Mother    Liver cancer Mother       PHYSICAL EXAM General: Pleasant elderly and thin Caucasian male, laying nearly flat in hospital bed with wife at bedside.   HEENT:  Normocephalic and atraumatic. Neck:  No JVD.  Lungs: Normal respiratory effort on room air. Clear bilaterally to auscultation. No wheezes, crackles, rhonchi.  Heart: HRRR . Normal S1 and S2 without gallops or murmurs. Radial & DP pulses 2+ bilaterally. Abdomen: Non-distended appearing.  Msk: Normal strength and tone for age. Extremities: Warm and well perfused. No clubbing, cyanosis.  Trace right lower extremity edema.  Neuro: Alert and oriented X 3. Psych:  Answers questions appropriately.   Labs:   Lab Results  Component Value Date   WBC 19.7 (H) 01/18/2022   HGB 13.3 01/18/2022   HCT 42.3 01/18/2022   MCV 102.9 (H) 01/18/2022   PLT 202 01/18/2022    Recent Labs  Lab 01/17/22 1405  NA 135  K 4.4  CL 105  CO2 24  BUN 35*  CREATININE 1.78*  CALCIUM 10.1  GLUCOSE 153*   Lab Results   Component Value Date   CKTOTAL 33 (L) 02/07/2015   TROPONINI 0.05 (HH) 10/03/2016    Lab Results  Component Value Date   CHOL 74 11/23/2021   CHOL 75 05/29/2020   CHOL 90 01/25/2020   Lab Results  Component Value Date   HDL 33 (L) 11/23/2021   HDL 42 05/29/2020   HDL 36 (L) 01/25/2020   Lab Results  Component Value Date   LDLCALC 18 11/23/2021   LDLCALC 13 05/29/2020   LDLCALC 27 01/25/2020   Lab Results  Component Value Date   TRIG 116 11/23/2021   TRIG 101 05/29/2020   TRIG 134 01/25/2020   Lab Results  Component Value Date   CHOLHDL  2.2 11/23/2021   CHOLHDL 1.8 05/29/2020   CHOLHDL 2.5 01/25/2020   No results found for: "LDLDIRECT"    Radiology: US Venous Img Lower Bilateral (DVT)  Result Date: 01/17/2022 CLINICAL DATA:  Leg swelling EXAM: BILATERAL LOWER EXTREMITY VENOUS DOPPLER ULTRASOUND TECHNIQUE: Gray-scale sonography with compression, as well as color and duplex ultrasound, were performed to evaluate the deep venous system(s) from the level of the common femoral vein through the popliteal and proximal calf veins. COMPARISON:  None Available. FINDINGS: VENOUS Normal compressibility of the common femoral, superficial femoral, and popliteal veins, as well as the visualized calf veins. Visualized portions of profunda femoral vein and great saphenous vein unremarkable. No filling defects to suggest DVT on grayscale or color Doppler imaging. Doppler waveforms show normal direction of venous flow, normal respiratory plasticity and response to augmentation. OTHER None. Limitations: none IMPRESSION: Negative. Electronically Signed   By: Rolm Baptise M.D.   On: 01/17/2022 23:14   DG Chest Portable 1 View  Result Date: 01/17/2022 CLINICAL DATA:  Hypotension.  Question pulmonary edema. EXAM: PORTABLE CHEST 1 VIEW COMPARISON:  11/23/2021 FINDINGS: Previous median sternotomy and CABG. Heart size within normal limits. Chronic aortic atherosclerotic calcification. Patient has  taken a poor inspiration. Allowing for that, the lungs are clear. No edema or effusion. Mild chronic pleural scarring on the left. IMPRESSION: Previous CABG.  Poor inspiration.  No active disease by radiography. Electronically Signed   By: Nelson Chimes M.D.   On: 01/17/2022 16:07    ECHO  12/10/2021 limited study ECHOCARDIOGRAPHIC MEASUREMENTS  2D DIMENSIONS  AORTA                  Values   Normal Range   MAIN PA         Values    Normal Range                Annulus: nm*          [2.3-2.9]         PA Main: nm*       [1.5-2.1]              Aorta Sin: 3.4 cm       [3.1-3.7]    RIGHT VENTRICLE            ST Junction: nm*          [2.6-3.2]         RV Base: 2.6 cm    [<4.2]              Asc.Aorta: nm*          [2.6-3.4]          RV Mid: 1.7 cm    [<3.5]  LEFT VENTRICLE                                      RV Length: nm*       [<8.6]                  LVIDd: 3.8 cm       [4.2-5.9]    INFERIOR VENA CAVA                  LVIDs: 2.8 cm                        Max. IVC: nm*       [<=  2.1]                     FS: 27.0 %       [>25]            Min. IVC: nm*                    SWT: 1.3 cm       [0.6-1.0]    ------------------                    PWT: 1.5 cm       [0.6-1.0]    nm* - not measured  LEFT ATRIUM                LA Diam: 3.5 cm       [3.0-4.0]            LA A4C Area: 16.1 cm2     [<20]              LA Volume: 33.2 ml      [18-58]  _________________________________________________________________________________________  ECHOCARDIOGRAPHIC DESCRIPTIONS  AORTIC ROOT                   Size: Normal             Dissection: INDETERM FOR DISSECTION  AORTIC VALVE               Leaflets: Tricuspid                   Morphology: Normal               Mobility: Fully mobile  LEFT VENTRICLE                   Size: Normal                        Anterior: HYPOCONTRACTILE            Contraction: MOD GLOBAL DECREASE            Lateral: HYPOCONTRACTILE             Closest EF: 40% (Estimated)                  Septal: HYPOCONTRACTILE              LV Masses: No Masses                       Apical: HYPOCONTRACTILE                    LVH: MODERATE LVH CONCENTRIC       Inferior: HYPOCONTRACTILE                                                      Posterior: HYPOCONTRACTILE           Dias.FxClass: N/A  MITRAL VALVE               Leaflets: Normal                        Mobility: Fully mobile             Morphology: Normal  LEFT ATRIUM  Size: Normal                       LA Masses: No masses              IA Septum: Normal IAS  MAIN PA                   Size: Normal  PULMONIC VALVE             Morphology: Normal                        Mobility: Fully mobile  RIGHT VENTRICLE              RV Masses: No Masses                         Size: Normal              Free Wall: Normal                     Contraction: Normal  TRICUSPID VALVE               Leaflets: Normal                        Mobility: Fully mobile             Morphology: Normal  RIGHT ATRIUM                   Size: Normal                        RA Other: None                RA Mass: No masses  PERICARDIUM                  Fluid: No effusion  INFERIOR VENACAVA                   Size: Not seen Not Seen  _________________________________________________________________________________________   DOPPLER ECHO and OTHER SPECIAL PROCEDURES                 Aortic: N/A                        N/A                 Mitral: N/A                        N/A                         MV Inflow E Vel = nm*      MV Annulus E'Vel = 4.0 cm/sec                         E/E'Ratio = nm*              Tricuspid: N/A                        N/A              Pulmonary: N/A  N/A  _________________________________________________________________________________________  INTERPRETATION  MODERATE LV SYSTOLIC DYSFUNCTION (See above)   WITH MODERATE LVH  NORMAL RIGHT VENTRICULAR SYSTOLIC FUNCTION  NO COLOR DOPPLER PERFORMED  NO  VALVULAR STENOSIS  ESTIMATED LVEF 40% (CALCULATED: 36.7%)  GLS: -7.1%  Compared to echo from 07/2021, EF is simililar, possibly slightly lower.   TELEMETRY reviewed by me: Sinus rhythm rate 70s-80s with PVCs  EKG reviewed by me: Sinus rhythm rate 84, LVH  ASSESSMENT AND PLAN:  Daniel Bray is an 24yoM with a PMH of CAD s/p CABG x3 in 10/2013 (LIMA to LAD, SVG to OM1 and D1), ICM with recovered EF (40% by echo 12/2021) s/p ICD 2009, PVC/VT ablation 2017, type 2 diabetes, hypertension, hyperlipidemia, hypothyroidism, metastatic clear-cell RCC s/p left nephrectomy, pancreatic cancer s/p resection, s/p partial colectomy, hairy cell leukemia in remission, tobacco abuse who presented to [facility] ED 01/17/2022 with weakness, fatigue, and low blood pressure.  Cardiology is consulted because of his elevated troponin.  #Shortness of breath #Elevated troponin The patient presents with a several month history of worsening dyspnea on exertion without chest pain or his prior anginal equivalent of jaw pain.  He has had work-up with pulmonology and was prescribed inhalers that did not really help his symptoms, and was planned for right heart catheterization on 6/22 with his normal cardiologist for further work-up, however he had worsening of dyspnea on exertion and generalized weakness over the past week.  He did a lot of yard work yesterday and felt like he could pass out afterwards despite taking breaks, hypotensive per his home blood pressure reading at 87/43 and was advised to come to the ED by [facility] cardiology.  His troponin is elevated but flat trending at 45-45, similar to prior admissions, BNP is elevated higher than it has been in the 300s, but does not look clinically volume overloaded on exam. -S/p 500 mL NS with improvement in his blood pressure -Continue 81 mg aspirin daily -Consider gentle diuresis if worsening dyspnea at rest or hypoxia -Plan for RHC this afternoon with Dr. Clayborn Bigness and to further  evaluate cardiac output and right heart pressures causes of his dyspnea on exertion.  Risk, benefits, discussed to patient and he is agreeable to proceed today. -Okay to continue heparin drip until right heart cath, can likely discontinue after -Consider cMRI for further evaluation of possible infiltrative process, will need to investigate if this can be done here with the patient's Medtronic defibrillator vs at a tertiary facility   #CAD s/p CABG x3 2015 -Recent LHC 08/03/2021 with patent grafts with severe native coronary disease.  Denies his anginal equivalent of jaw pain or chest pain -Continue aspirin 81 mg daily -Continue Coreg 6.25 mg twice daily -Continue rosuvastatin 40 mg daily  #ICM s/p Medtronic ICD 2009 #History of VT/PVCs s/p ablation 2017 EF by cMRI 09/2020 was 53%, repeat limited echo 12/2021 showed EF of 40%.  MRI showed ischemic and nonischemic scar which could suggest an infiltrative CM. -Continue GDMT with Coreg 6.25 mg twice daily, Jardiance, hydralazine 25 mg twice daily  #CKD 3, RCC s/p L nephrectomy Renal function on admission slightly worse than baseline at Cr 1.45 and GFR 45 -baseline Cr 1.78, EGFR 37 on 5/25  This patient's plan of care was discussed and created with Dr. Clayborn Bigness and he is in agreement.  Signed: Tristan Schroeder , PA-C 01/18/2022, 8:10 AM [facility]

## 2022-01-18 NOTE — Progress Notes (Signed)
*  PRELIMINARY RESULTS* Echocardiogram 2D Echocardiogram has been performed.  Daniel Bray 01/18/2022, 11:58 AM

## 2022-01-18 NOTE — Care Management CC44 (Signed)
Condition Code 44 Documentation Completed  Patient Details  Name: Daniel Bray MRN: [id number] Date of Birth: 1935/05/05   Condition Code 44 given:  Yes Patient signature on Condition Code 44 notice:  Yes Documentation of 2 MD's agreement:  Yes Code 44 added to claim:  Yes    Donnelly Angelica, LCSW 01/18/2022, 2:42 PM

## 2022-01-18 NOTE — ED Notes (Signed)
Pt started on 2lpm O2 due to desatting to 86-87% while sleeping. Pt states he does not have sleep apnea. Urinal emptied at this time.

## 2022-01-18 NOTE — ED Notes (Signed)
Echo tech w/ pt currently

## 2022-01-18 NOTE — Consult Note (Signed)
ANTICOAGULATION CONSULT NOTE   Pharmacy Consult for heparin infusion Indication: chest pain/ACS  Allergies  Allergen Reactions   Levofloxacin Hives and Other (See Comments)   Levaquin [Levofloxacin In D5w]     Weakness, low blood pressure   Januvia [Sitagliptin] Rash    Patient Measurements: Height: '5\' 10"'$  (177.8 cm) Weight: 67.4 kg (148 lb 9.4 oz) IBW/kg (Calculated) : 73 Heparin Dosing Weight: 67.4 kg   Vital Signs: BP: 145/83 (06/16 0400) Pulse Rate: 70 (06/16 0400)  Labs: Recent Labs    01/17/22 1405 01/17/22 1616 01/17/22 1755 01/18/22 0357  HGB 13.5  --   --  13.3  HCT 43.1  --   --  42.3  PLT 204  --   --  202  APTT  --   --  34  --   LABPROT  --   --  14.1  --   INR  --   --  1.1  --   HEPARINUNFRC  --   --   --  0.64  CREATININE 1.78*  --   --   --   TROPONINIHS  --  45* 45*  --      Estimated Creatinine Clearance: 29.5 mL/min (A) (by C-G formula based on SCr of 1.78 mg/dL (H)).   Medical History: Past Medical History:  Diagnosis Date   Cancer ([place])    kidney   Cancer ([place])    pancreas   Cancer ([place])    colon    Diabetes mellitus without complication ([place])    Hyperlipidemia    Hypertension    Myocardial infarction ([place])    Streptococcal infection    Strep Bovis   Thyroid disease     Medications:  No prior AC noted   Assessment: 87 y.o. male history of CAD presents to the ER for evaluation of exertional dyspnea and episode of hypotension. Initial troponin I elevated at 45. Pharmacy has been consulted to initiate and manage heparin infusion.  Goal of Therapy:  Heparin level 0.3-0.7 units/ml Monitor platelets by anticoagulation protocol: Yes  6/16 0357 HL 0.64, therapeutic x 1   Plan:  Continue heparin infusion at 800 units/hr Recheck HL in 8 hr to confirm  Continue to monitor H&H and platelets  Renda Rolls, PharmD, [facility] 01/18/2022 4:54 AM

## 2022-01-18 NOTE — Discharge Summary (Signed)
Physician Discharge Summary  Daniel Bray JSE:[id number] DOB: 05-21-1935 DOA: 01/17/2022  PCP: Elza Rafter, MD  Admit date: 01/17/2022 Discharge date: 01/18/2022  Admitted From: Home Disposition:  Home  Recommendations for Outpatient Follow-up:  Follow up with PCP in 1-2 weeks Follow up with Dr. Corky Sox as directed  Home Health:No  Equipment/Devices:None   Discharge Condition:Stable  CODE STATUS:FULL  Diet recommendation: Heart healthy  Brief/Interim Summary: 87 year old male pancreatic cancer status post pancreatic resection, renal cancer status post left nephrectomy, status post partial colectomy, CAD, history of hairy cell leukemia in remission, chronic leukocytosis, status post CABG, hypertension, hyperlipidemia, diabetes mellitus, GERD, hypothyroid, anxiety, CKD 3B, small bowel obstruction, tobacco abuse, who presents emergency department for chief concerns of low blood pressure weakness and fatigue.   Initial vitals in the emergency department showed temperature of 98, respiration rate of 17, heart rate 83, blood pressure 100/67, improved to 156/98, SPO2 96% on room air.  Serum sodium is 135, potassium 4.4, chloride 105, bicarb 24, BUN of 35, serum creatinine 1.78, nonfasting blood glucose 153, GFR 37, WBC 17, hemoglobin 13.5, platelets of 204.   High sensitive troponin was 45.   Seen in consultation by cardiology.  Concern for pulmonary hypertension.  Taken for right heart catheterization 6/16.  Catheterization results revealed pulmonary hypertension.  Cleared for discharge from cardiology standpoint.  Patient will discharge home.  New prescription added Lasix 20 mg p.o. daily.  Hold if SBP less than 110.  Dose of carvedilol decreased from 12.5 twice daily to 6.25 twice daily.  Remainder of her medications unchanged    Discharge Diagnoses:  Principal Problem:   SOB (shortness of breath) Active Problems:   Elevated troponin   CAD (coronary artery disease)    Type 2 diabetes mellitus with hyperlipidemia (HCC)   History of renal cell carcinoma s/p nephrectomy   Hypothyroidism   Leukocytosis   HLD (hyperlipidemia)   GERD (gastroesophageal reflux disease)   Stage 3b chronic kidney disease (HCC)   Hx of CABG   Weakness  Pulmonary hypertension Status post right heart catheterization.  Results revealed the same.  Stable for discharge.  Per cardiology recommendations will discharge on Lasix 20 mg p.o. daily with instructions to hold if SBP less than 110.  Dose of home Coreg decreased from 12.5-6.25 twice daily.  Follow-up outpatient cardiology  Discharge Instructions  Discharge Instructions     Diet - low sodium heart healthy   Complete by: As directed    Increase activity slowly   Complete by: As directed       Allergies as of 01/18/2022       Reactions   Levofloxacin Hives, Other (See Comments)   Levaquin [levofloxacin In D5w]    Weakness, low blood pressure   Januvia [sitagliptin] Rash        Medication List     TAKE these medications    acetaminophen 650 MG CR tablet Commonly known as: TYLENOL Take 1,300 mg by mouth every 8 (eight) hours as needed for pain.   albuterol 108 (90 Base) MCG/ACT inhaler Commonly known as: VENTOLIN HFA Inhale 2 puffs into the lungs every 2 (two) hours as needed for wheezing or shortness of breath.   Alka-Seltzer Heartburn + Gas 750-80 MG Chew Generic drug: Calcium Carbonate-Simethicone Chew 2 each by mouth daily as needed (gas/heartburn).   aspirin EC 81 MG tablet Take 81 mg by mouth daily.   carvedilol 6.25 MG tablet Commonly known as: COREG Take 1 tablet (6.25 mg total) by mouth 2 (  two) times daily. What changed:  medication strength how much to take   cholecalciferol 1000 units tablet Commonly known as: VITAMIN D Take 1,000 Units by mouth daily.   diclofenac Sodium 1 % Gel Commonly known as: VOLTAREN Apply 1 application topically daily as needed (pain).   Dulaglutide 3  MG/0.5ML Sopn Inject 3 mg into the skin every Monday.   empagliflozin 25 MG Tabs tablet Commonly known as: JARDIANCE Take 25 mg by mouth daily.   FIBER CHOICE PO Take 5 tablets by mouth with breakfast, with lunch, and with evening meal.   furosemide 20 MG tablet Commonly known as: LASIX Take 1 tablet (20 mg total) by mouth daily. Do not take if your systolic BP is less than 629 Start taking on: January 19, 2022   gabapentin 300 MG capsule Commonly known as: NEURONTIN Take 300 mg by mouth at bedtime.   glipiZIDE 10 MG tablet Commonly known as: GLUCOTROL Take 10 mg by mouth 2 (two) times daily with a meal.   hydrALAZINE 25 MG tablet Commonly known as: APRESOLINE Take 25 mg by mouth 2 (two) times daily.   levocetirizine 5 MG tablet Commonly known as: XYZAL Take 5 mg by mouth every evening.   levothyroxine 75 MCG tablet Commonly known as: SYNTHROID Take 75 mcg by mouth daily before breakfast.   Melatonin 10 MG Tabs Take 10 mg by mouth at bedtime as needed (sleep).   omeprazole 40 MG capsule Commonly known as: PRILOSEC Take 40 mg by mouth daily.   oxymetazoline 0.05 % nasal spray Commonly known as: AFRIN Place 1 spray into both nostrils at bedtime.   PRESERVISION AREDS 2+MULTI VIT PO Take 1 capsule by mouth in the morning and at bedtime.   pyridoxine 100 MG tablet Commonly known as: B-6 Take 100 mg by mouth daily.   rosuvastatin 40 MG tablet Commonly known as: CRESTOR Take 40 mg by mouth daily.        Follow-up Information     Andrez Grime, MD. Go in 1 week(s).   Specialty: Cardiology Contact information: [place] [address] [phone]                Allergies  Allergen Reactions   Levofloxacin Hives and Other (See Comments)   Levaquin [Levofloxacin In D5w]     Weakness, low blood pressure   Januvia [Sitagliptin] Rash    Consultations: Cardiology   Procedures/Studies: CARDIAC CATHETERIZATION  Result  Date: 5/36/6440   LV end diastolic pressure is normal.   Hemodynamic findings consistent with pulmonary hypertension. Conclusion right heart cath Normal right heart cath pressures Mean wedge 8 mmHg PA mean 17 mmHg No evidence of pulm hypertension on right heart cath   US Venous Img Lower Bilateral (DVT)  Result Date: 01/17/2022 CLINICAL DATA:  Leg swelling EXAM: BILATERAL LOWER EXTREMITY VENOUS DOPPLER ULTRASOUND TECHNIQUE: Gray-scale sonography with compression, as well as color and duplex ultrasound, were performed to evaluate the deep venous system(s) from the level of the common femoral vein through the popliteal and proximal calf veins. COMPARISON:  None Available. FINDINGS: VENOUS Normal compressibility of the common femoral, superficial femoral, and popliteal veins, as well as the visualized calf veins. Visualized portions of profunda femoral vein and great saphenous vein unremarkable. No filling defects to suggest DVT on grayscale or color Doppler imaging. Doppler waveforms show normal direction of venous flow, normal respiratory plasticity and response to augmentation. OTHER None. Limitations: none IMPRESSION: Negative. Electronically Signed   By: Lennette Bihari  [place] M.D.   On: 01/17/2022 23:14   DG Chest Portable 1 View  Result Date: 01/17/2022 CLINICAL DATA:  Hypotension.  Question pulmonary edema. EXAM: PORTABLE CHEST 1 VIEW COMPARISON:  11/23/2021 FINDINGS: Previous median sternotomy and CABG. Heart size within normal limits. Chronic aortic atherosclerotic calcification. Patient has taken a poor inspiration. Allowing for that, the lungs are clear. No edema or effusion. Mild chronic pleural scarring on the left. IMPRESSION: Previous CABG.  Poor inspiration.  No active disease by radiography. Electronically Signed   By: Nelson Chimes M.D.   On: 01/17/2022 16:07      Subjective: Seen and examined on day of discharge.  Stable no distress.  Stable for discharge home.  Discharge Exam: Vitals:    01/18/22 1337 01/18/22 1345  BP: (!) 148/92 (!) 148/88  Pulse: 68 73  Resp: 17 16  Temp:    SpO2: 97% 95%   Vitals:   01/18/22 1317 01/18/22 1322 01/18/22 1337 01/18/22 1345  BP: 126/89 139/87 (!) 148/92 (!) 148/88  Pulse: 75 68 68 73  Resp: '18 11 17 16  '$ Temp:      TempSrc:      SpO2: 93% 94% 97% 95%  Weight:      Height:        General: Pt is alert, awake, not in acute distress Cardiovascular: RRR, S1/S2 +, no rubs, no gallops Respiratory: CTA bilaterally, no wheezing, no rhonchi Abdominal: Soft, NT, ND, bowel sounds + Extremities: no edema, no cyanosis    The results of significant diagnostics from this hospitalization (including imaging, microbiology, ancillary and laboratory) are listed below for reference.     Microbiology: No results found for this or any previous visit (from the past 240 hour(s)).   Labs: BNP (last 3 results) Recent Labs    11/23/21 1136 01/18/22 0357  BNP 258.1* 466.5*   Basic Metabolic Panel: Recent Labs  Lab 01/17/22 1405  NA 135  K 4.4  CL 105  CO2 24  GLUCOSE 153*  BUN 35*  CREATININE 1.78*  CALCIUM 10.1   Liver Function Tests: No results for input(s): "AST", "ALT", "ALKPHOS", "BILITOT", "PROT", "ALBUMIN" in the last 168 hours. No results for input(s): "LIPASE", "AMYLASE" in the last 168 hours. No results for input(s): "AMMONIA" in the last 168 hours. CBC: Recent Labs  Lab 01/17/22 1405 01/18/22 0357  WBC 17.0* 19.7*  HGB 13.5 13.3  HCT 43.1 42.3  MCV 103.1* 102.9*  PLT 204 202   Cardiac Enzymes: No results for input(s): "CKTOTAL", "CKMB", "CKMBINDEX", "TROPONINI" in the last 168 hours. BNP: Invalid input(s): "POCBNP" CBG: Recent Labs  Lab 01/17/22 2113 01/18/22 0725 01/18/22 1159  GLUCAP 148* 169* 119*   D-Dimer No results for input(s): "DDIMER" in the last 72 hours. Hgb A1c No results for input(s): "HGBA1C" in the last 72 hours. Lipid Profile No results for input(s): "CHOL", "HDL", "LDLCALC", "TRIG",  "CHOLHDL", "LDLDIRECT" in the last 72 hours. Thyroid function studies No results for input(s): "TSH", "T4TOTAL", "T3FREE", "THYROIDAB" in the last 72 hours.  Invalid input(s): "FREET3" Anemia work up No results for input(s): "VITAMINB12", "FOLATE", "FERRITIN", "TIBC", "IRON", "RETICCTPCT" in the last 72 hours. Urinalysis    Component Value Date/Time   COLORURINE YELLOW (A) 01/17/2022 1616   APPEARANCEUR CLEAR (A) 01/17/2022 1616   LABSPEC 1.017 01/17/2022 1616   PHURINE 5.0 01/17/2022 1616   GLUCOSEU >=500 (A) 01/17/2022 1616   HGBUR NEGATIVE 01/17/2022 1616   BILIRUBINUR NEGATIVE 01/17/2022 1616   KETONESUR NEGATIVE 01/17/2022 1616  PROTEINUR NEGATIVE 01/17/2022 1616   NITRITE NEGATIVE 01/17/2022 1616   LEUKOCYTESUR NEGATIVE 01/17/2022 1616   Sepsis Labs Recent Labs  Lab 01/17/22 1405 01/18/22 0357  WBC 17.0* 19.7*   Microbiology No results found for this or any previous visit (from the past 240 hour(s)).   Time coordinating discharge: Over 30 minutes  SIGNED:   Sidney Ace, MD  [facility] 01/18/2022, 1:58 PM Pager   If 7PM-7AM, please contact night-coverage

## 2022-01-18 NOTE — ED Notes (Signed)
Report given to Ena Dawley RN- specials to come get pt shortly.

## 2022-01-18 NOTE — Discharge Instructions (Addendum)
$'20mg'C$  PO lasix daily with instructions to NOT take if his BP is <458 systolic please. Remove dressing from right bend of arm tomorrow.  If you note bleeding under this dressing hold pressure for ten minutes, remove dressing and redress if needed.  If you feel anything is life threatening please return to Emergency room and tell them you had a right heart catheterization today.

## 2022-01-21 ENCOUNTER — Encounter: Payer: Self-pay | Admitting: Internal Medicine

## 2022-01-24 ENCOUNTER — Ambulatory Visit: Admission: RE | Admit: 2022-01-24 | Payer: Medicare HMO | Source: Home / Self Care | Admitting: Cardiology

## 2022-01-24 ENCOUNTER — Encounter: Admission: RE | Payer: Self-pay | Source: Home / Self Care

## 2022-01-24 DIAGNOSIS — R0602 Shortness of breath: Secondary | ICD-10-CM

## 2022-01-24 DIAGNOSIS — I5020 Unspecified systolic (congestive) heart failure: Secondary | ICD-10-CM

## 2022-01-24 SURGERY — RIGHT HEART CATH
Anesthesia: Moderate Sedation

## 2022-02-11 ENCOUNTER — Other Ambulatory Visit: Payer: Self-pay | Admitting: Cardiology

## 2022-02-11 ENCOUNTER — Other Ambulatory Visit (HOSPITAL_COMMUNITY): Payer: Self-pay | Admitting: Cardiology

## 2022-02-11 DIAGNOSIS — I2510 Atherosclerotic heart disease of native coronary artery without angina pectoris: Secondary | ICD-10-CM

## 2022-02-13 ENCOUNTER — Other Ambulatory Visit (HOSPITAL_COMMUNITY): Payer: Self-pay | Admitting: Emergency Medicine

## 2022-02-13 DIAGNOSIS — R9430 Abnormal result of cardiovascular function study, unspecified: Secondary | ICD-10-CM

## 2022-02-26 ENCOUNTER — Encounter: Payer: Self-pay | Admitting: Emergency Medicine

## 2022-02-26 ENCOUNTER — Emergency Department: Payer: Medicare HMO

## 2022-02-26 ENCOUNTER — Ambulatory Visit
Admission: EM | Admit: 2022-02-26 | Discharge: 2022-02-26 | Payer: Medicare HMO | Attending: Emergency Medicine | Admitting: Emergency Medicine

## 2022-02-26 ENCOUNTER — Emergency Department
Admission: EM | Admit: 2022-02-26 | Discharge: 2022-02-26 | Disposition: A | Discharge status: Home / Self Care | Payer: Medicare HMO | Attending: Emergency Medicine | Admitting: Emergency Medicine

## 2022-02-26 ENCOUNTER — Other Ambulatory Visit: Payer: Self-pay

## 2022-02-26 DIAGNOSIS — R531 Weakness: Secondary | ICD-10-CM | POA: Insufficient documentation

## 2022-02-26 DIAGNOSIS — R9431 Abnormal electrocardiogram [ECG] [EKG]: Secondary | ICD-10-CM | POA: Diagnosis not present

## 2022-02-26 DIAGNOSIS — J029 Acute pharyngitis, unspecified: Secondary | ICD-10-CM | POA: Insufficient documentation

## 2022-02-26 DIAGNOSIS — R058 Other specified cough: Secondary | ICD-10-CM

## 2022-02-26 DIAGNOSIS — I959 Hypotension, unspecified: Secondary | ICD-10-CM | POA: Diagnosis not present

## 2022-02-26 DIAGNOSIS — R059 Cough, unspecified: Secondary | ICD-10-CM | POA: Diagnosis present

## 2022-02-26 LAB — URINALYSIS, ROUTINE W REFLEX MICROSCOPIC
Bacteria, UA: NONE SEEN
Bilirubin Urine: NEGATIVE
Glucose, UA: >=500 mg/dL — AB
Hgb urine dipstick: NEGATIVE
Ketones, ur: NEGATIVE mg/dL
Leukocytes,Ua: NEGATIVE
Nitrite: NEGATIVE
Protein, ur: NEGATIVE mg/dL
Specific Gravity, Urine: 1.026 (ref 1.005–1.030)
Squamous Epithelial / HPF: NONE SEEN (ref 0–5)
pH: 5 (ref 5.0–8.0)

## 2022-02-26 LAB — HEPATIC FUNCTION PANEL
ALT: 41 U/L (ref 0–44)
AST: 30 U/L (ref 15–41)
Albumin: 3.3 g/dL — ABNORMAL LOW (ref 3.5–5.0)
Alkaline Phosphatase: 59 U/L (ref 38–126)
Bilirubin, Direct: 0.1 mg/dL (ref 0.0–0.2)
Indirect Bilirubin: 0.7 mg/dL (ref 0.3–0.9)
Total Bilirubin: 0.8 mg/dL (ref 0.3–1.2)
Total Protein: 6.2 g/dL — ABNORMAL LOW (ref 6.5–8.1)

## 2022-02-26 LAB — BASIC METABOLIC PANEL
Anion gap: 7 (ref 5–15)
BUN: 37 mg/dL — ABNORMAL HIGH (ref 8–23)
CO2: 21 mmol/L — ABNORMAL LOW (ref 22–32)
Calcium: 9.1 mg/dL (ref 8.9–10.3)
Chloride: 107 mmol/L (ref 98–111)
Creatinine, Ser: 1.67 mg/dL — ABNORMAL HIGH (ref 0.61–1.24)
GFR, Estimated: 40 mL/min — ABNORMAL LOW (ref >60)
Glucose, Bld: 206 mg/dL — ABNORMAL HIGH (ref 70–99)
Potassium: 4.2 mmol/L (ref 3.5–5.1)
Sodium: 135 mmol/L (ref 135–145)

## 2022-02-26 LAB — CBC
HCT: 41.9 % (ref 39.0–52.0)
Hemoglobin: 13.1 g/dL (ref 13.0–17.0)
MCH: 32.9 pg (ref 26.0–34.0)
MCHC: 31.3 g/dL (ref 30.0–36.0)
MCV: 105.3 fL — ABNORMAL HIGH (ref 80.0–100.0)
Platelets: 161 10*3/uL (ref 150–400)
RBC: 3.98 MIL/uL — ABNORMAL LOW (ref 4.22–5.81)
RDW: 13.3 % (ref 11.5–15.5)
WBC: 15.9 10*3/uL — ABNORMAL HIGH (ref 4.0–10.5)
nRBC: 0 % (ref 0.0–0.2)

## 2022-02-26 LAB — T4, FREE: Free T4: 0.99 ng/dL (ref 0.61–1.12)

## 2022-02-26 LAB — TROPONIN I (HIGH SENSITIVITY): Troponin I (High Sensitivity): 44 ng/L — ABNORMAL HIGH (ref <18)

## 2022-02-26 MED ORDER — AZITHROMYCIN 250 MG PO TABS: ORAL_TABLET | ORAL | 0 refills | Status: AC

## 2022-02-26 MED ORDER — ACETAMINOPHEN 500 MG PO TABS: 500.0000 mg | ORAL_TABLET | Freq: Once | ORAL | Status: DC

## 2022-02-26 NOTE — ED Provider Notes (Signed)
[facility]    CSN: [id number] Arrival date & time: 02/26/22  1000      History   Chief Complaint Chief Complaint  Patient presents with   Nasal Congestion   low bp   Nausea    HPI Daniel Bray is a 87 y.o. male.   HPI  87 year old male here for evaluation of low blood pressure.  Patient reports that he has been experiencing sore throat, difficulty swallowing, and congestion in his throat for the past 2 days.  He had a single episode of nausea and vomiting yesterday along with the decreased appetite and states that he began feeling weak.  This morning when he woke up he had a low blood sugar of 79 and also low blood pressure of 91/57.  He has been experiencing ongoing shortness of breath.  He denies any headache, dizziness, or chest pain.  He does have a significant past medical history to include syncope, type 2 diabetes, accelerated hypertension, hypothyroidism, CAD, and stage III kidney disease.  Past Medical History:  Diagnosis Date   Cancer ([place])    kidney   Cancer ([place])    pancreas   Cancer ([place])    colon    Diabetes mellitus without complication ([place])    Hyperlipidemia    Hypertension    Myocardial infarction ([place])    Streptococcal infection    Strep [place]   Thyroid disease     Patient Active Problem List   Diagnosis Date Noted   Weakness 01/17/2022   SOB (shortness of breath) 11/23/2021   Type II diabetes mellitus with renal manifestations ([place]) 11/23/2021   Hiccups    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   Diabetic polyneuropathy associated with type 2 diabetes mellitus ([place])    Ileus ([place]) 05/29/2020   Acute renal failure superimposed on stage 3a chronic kidney disease ([place]) 05/28/2020   Nausea vomiting and diarrhea 05/28/2020   Abdominal pain 05/28/2020   Hyperkalemia 05/28/2020   Hypercalcemia 05/28/2020   Elevated troponin 01/24/2020   Leukocytosis 01/24/2020   HLD (hyperlipidemia) 01/24/2020   GERD (gastroesophageal reflux disease)  01/24/2020   Anxiety 01/24/2020   CAD (coronary artery disease) 01/24/2020   Tobacco abuse 01/24/2020   Stage 3b chronic kidney disease ([place]) 01/24/2020   Bradycardia 01/24/2020   Laceration of forehead    Syncope 10/12/2019   Type 2 diabetes mellitus with hyperlipidemia ([place]) 10/12/2019   Accelerated hypertension 10/12/2019   History of renal cell carcinoma s/p nephrectomy 10/12/2019   SBO (small bowel obstruction) ([place]) 10/12/2019   Hypothyroidism 10/12/2019    Past Surgical History:  Procedure Laterality Date   CARDIAC ELECTROPHYSIOLOGY STUDY AND ABLATION     CARDIAC SURGERY     CORONARY ANGIOPLASTY WITH STENT PLACEMENT     CORONARY ARTERY BYPASS GRAFT     x3   LEFT HEART CATH AND CORONARY ANGIOGRAPHY N/A 08/03/2021   Procedure: LEFT HEART CATH AND CORONARY ANGIOGRAPHY;  Surgeon: Andrez Grime, MD;  Location: [facility];  Service: Cardiovascular;  Laterality: N/A;   PANCREAS SURGERY     RIGHT HEART CATH N/A 01/18/2022   Procedure: RIGHT HEART CATH;  Surgeon: Yolonda Kida, MD;  Location: [facility];  Service: Cardiovascular;  Laterality: N/A;   SPLENECTOMY         Home Medications    Prior to Admission medications   Medication Sig Start Date End Date Taking? Authorizing Provider  acetaminophen (TYLENOL) 650 MG CR tablet Take 1,300 mg by mouth every  8 (eight) hours as needed for pain.   Yes [provider]  aspirin EC 81 MG tablet Take 81 mg by mouth daily.   Yes [provider]  carvedilol (COREG) 6.25 MG tablet Take 1 tablet (6.25 mg total) by mouth 2 (two) times daily. 01/18/22 03/19/22 Yes Sreenath, Sudheer B, MD  cholecalciferol (VITAMIN D) 1000 units tablet Take 1,000 Units by mouth daily.   Yes [provider]  diclofenac Sodium (VOLTAREN) 1 % GEL Apply 1 application topically daily as needed (pain).   Yes [provider]  Dulaglutide 3 MG/0.5ML SOPN Inject 3 mg into the skin every Monday.   Yes  [provider]  empagliflozin (JARDIANCE) 25 MG TABS tablet Take 25 mg by mouth daily.   Yes [provider]  gabapentin (NEURONTIN) 300 MG capsule Take 300 mg by mouth at bedtime.   Yes [provider]  glipiZIDE (GLUCOTROL) 10 MG tablet Take 10 mg by mouth 2 (two) times daily with a meal.    Yes [provider]  hydrALAZINE (APRESOLINE) 25 MG tablet Take 25 mg by mouth 2 (two) times daily.   Yes [provider]  levocetirizine (XYZAL) 5 MG tablet Take 5 mg by mouth every evening. 11/27/20  Yes [provider]  levothyroxine (SYNTHROID) 75 MCG tablet Take 75 mcg by mouth daily before breakfast.    Yes [provider]  Melatonin 10 MG TABS Take 10 mg by mouth at bedtime as needed (sleep).   Yes [provider]  omeprazole (PRILOSEC) 40 MG capsule Take 40 mg by mouth daily.    Yes [provider]  oxymetazoline (AFRIN) 0.05 % nasal spray Place 1 spray into both nostrils at bedtime.   Yes [provider]  pyridoxine (B-6) 100 MG tablet Take 100 mg by mouth daily.   Yes [provider]  rosuvastatin (CRESTOR) 40 MG tablet Take 40 mg by mouth daily.   Yes [provider]  albuterol (VENTOLIN HFA) 108 (90 Base) MCG/ACT inhaler Inhale 2 puffs into the lungs every 2 (two) hours as needed for wheezing or shortness of breath. 11/24/21   Fritzi Mandes, MD  Calcium Carbonate-Simethicone (ALKA-SELTZER HEARTBURN + GAS) 750-80 MG CHEW Chew 2 each by mouth daily as needed (gas/heartburn).    [provider]  furosemide (LASIX) 20 MG tablet Take 1 tablet (20 mg total) by mouth daily. Do not take if your systolic BP is less than 510 01/19/22 03/20/22  Sreenath, Sudheer B, MD  Inulin (FIBER CHOICE PO) Take 5 tablets by mouth with breakfast, with lunch, and with evening meal.    [provider]  Multiple Vitamins-Minerals (PRESERVISION AREDS 2+MULTI VIT PO) Take 1 capsule by mouth in the morning and  at bedtime.    [provider]    Family History Family History  Problem Relation Age of Onset   Pancreatic cancer Mother    Liver cancer Mother     Social History Social History   Tobacco Use   Smoking status: Every Day    Types: Cigars   Smokeless tobacco: Current    Types: Chew  Vaping Use   Vaping Use: Never used  Substance Use Topics   Alcohol use: No   Drug use: No     Allergies   Levofloxacin, Levaquin [levofloxacin in d5w], and Januvia [sitagliptin]   Review of Systems Review of Systems  Constitutional:  Positive for appetite change and fatigue.  HENT:  Positive for sore throat and trouble swallowing.  Respiratory:  Positive for shortness of breath.   Cardiovascular:  Negative for chest pain.  Neurological:  Negative for dizziness and headaches.     Physical Exam Triage Vital Signs ED Triage Vitals  Enc Vitals Group     BP 02/26/22 1030 (S) (!) 89/56     Pulse Rate 02/26/22 1030 73     Resp --      Temp 02/26/22 1030 98.2 F (36.8 C)     Temp Source 02/26/22 1030 Oral     SpO2 02/26/22 1030 98 %     Weight 02/26/22 1027 148 lb (67.1 kg)     Height 02/26/22 1027 '5\' 10"'$  (1.778 m)     Head Circumference --      Peak Flow --      Pain Score 02/26/22 1027 0     Pain Loc --      Pain Edu? --      Excl. in [place]? --    No data found.  Updated Vital Signs BP (S) (!) 89/56 (BP Location: Left Arm)   Pulse 73   Temp 98.2 F (36.8 C) (Oral)   Ht '5\' 10"'$  (1.778 m)   Wt 148 lb (67.1 kg)   SpO2 98%   BMI 21.24 kg/m   Visual Acuity Right Eye Distance:   Left Eye Distance:   Bilateral Distance:    Right Eye Near:   Left Eye Near:    Bilateral Near:     Physical Exam Vitals and nursing note reviewed.  Constitutional:      Appearance: Normal appearance. He is not ill-appearing.  HENT:     Head: Normocephalic and atraumatic.  Cardiovascular:     Rate and Rhythm: Normal rate and regular rhythm.     Pulses: Normal pulses.     Heart  sounds: Normal heart sounds. No murmur heard.    No friction rub. No gallop.  Pulmonary:     Effort: Pulmonary effort is normal.     Breath sounds: Normal breath sounds. No wheezing, rhonchi or rales.  Skin:    General: Skin is warm and dry.     Capillary Refill: Capillary refill takes less than 2 seconds.  Neurological:     General: No focal deficit present.     Mental Status: He is alert and oriented to person, place, and time.  Psychiatric:        Mood and Affect: Mood normal.        Behavior: Behavior normal.        Thought Content: Thought content normal.        Judgment: Judgment normal.      UC Treatments / Results  Labs (all labs ordered are listed, but only abnormal results are displayed) Labs Reviewed - No data to display  EKG Sinus rhythm with first-degree AV block and occasional PVCs with ventricular rate of 68 bpm PR interval 212 ms QRS duration 120 ms QT/QTc 424/450 ms There is ST depression in V4, V5, and V6 in the ST segment appears to be trying to flip though it has not at present.   Radiology No results found.  Procedures Procedures (including critical care time)  Medications Ordered in UC Medications - No data to display  Initial Impression / Assessment and Plan / UC Course  I have reviewed the triage vital signs and the nursing notes.  Pertinent labs & imaging results that were available during my care of the patient were reviewed by me and considered in my  medical decision making (see chart for details).  Patient is a pleasant 87 year old male here for evaluation of sore throat, difficulty swallowing, decreased appetite, low blood pressure, low blood sugar, and shortness of breath that the been going on for the past 2 days.  The low blood pressure, low blood sugar, and decreased appetite, and weakness started yesterday and this morning respectively.  Patient's BP at home was 91/56 and he is currently 89/56 here in clinic.  He denies any headache,  dizziness, or chest pain.  EKG here in clinic shows sinus rhythm with a first-degree AV block as well as ST depression in V4, V5, and V6.  This is an interval change when compared to his EKG from 01/17/2022.  Considering patient's blood pressure, weakness, and EKG changes I feel would best suit him to be evaluated in the emergency department.  I have discussed this with Dr. Valere Dross and she is in concurrence.  Patient will go to [facility].  911 has been called.  Report given to [facility].  Care transferred.   Final Clinical Impressions(s) / UC Diagnoses   Final diagnoses:  Hypotension, unspecified hypotension type  Abnormal electrocardiogram (ECG) (EKG)     Discharge Instructions      Please go to the emergency department at [facility] for evaluation of your symptoms and your EKG changes.     ED Prescriptions   None    PDMP not reviewed this encounter.   Margarette Canada, NP 02/26/22 1122

## 2022-02-26 NOTE — ED Triage Notes (Signed)
Patient to ED via [facility] from UC for weakness, SOB, and sore throat. Patient states he had one episode of vomiting yesterday. UC states EKG change- wanted further evaluation.

## 2022-02-26 NOTE — Discharge Instructions (Signed)
Please go to the emergency department at [facility] for evaluation of your symptoms and your EKG changes.

## 2022-02-26 NOTE — ED Triage Notes (Signed)
Patient c/o congestion -- 2 days ago.   Patient reports yesterday he had some nausea and vomiting.   Patient reports his BP was low this AM 91/57

## 2022-02-26 NOTE — ED Notes (Signed)
Pt sent from urgent care for weakness, and ekg changes.  No chest pain or sob.  Iv in place.  Sx began today  pt alert speech clear. . Nsr on monitor.  Family with pt.

## 2022-02-26 NOTE — ED Notes (Signed)
Patient is being discharged from the Urgent Care and sent to the Emergency Department via EMS . Per Margarette Canada NP, patient is in need of higher level of care due to hypotension and EKG changes. Patient is aware and verbalizes understanding of plan of care.  Vitals:   02/26/22 1030  BP: (S) (!) 89/56  Pulse: 73  Temp: 98.2 F (36.8 C)  SpO2: 98%

## 2022-02-26 NOTE — ED Triage Notes (Signed)
Per [facility] pt coming from UC at [facility] c/o sore throat, shortness of breath and weakness ongoing over the past couple weeks. Was hypotensive at [facility] and sent her for further evaluation.

## 2022-02-28 NOTE — ED Provider Notes (Signed)
[facility] Provider Note   Event Date/Time   First MD Initiated Contact with Patient 02/26/22 1548     (approximate) History  Weakness  HPI Daniel Bray is a 87 y.o. male who presents for productive cough over the last week.  Patient states that he has been coughing up white to yellow sputum over this time with associated shortness of breath.  Patient denies any fever.  Patient denies any difficulty speaking or swallowing.  Patient does endorse mild dyspnea on exertion.  Patient denies any active chest pain.  Patient was sent from urgent care for blood pressure of 91/57.  Patient blood pressure improved upon arrival. ROS: Patient currently denies any vision changes, tinnitus, difficulty speaking, facial droop, sore throat, chest pain, abdominal pain, nausea/vomiting/diarrhea, dysuria, or weakness/numbness/paresthesias in any extremity   Physical Exam  Triage Vital Signs: ED Triage Vitals  Enc Vitals Group     BP 02/26/22 1232 110/64     Pulse Rate 02/26/22 1232 63     Resp 02/26/22 1232 18     Temp 02/26/22 1232 97.9 F (36.6 C)     Temp Source 02/26/22 1232 Oral     SpO2 02/26/22 1232 96 %     Weight 02/26/22 1230 148 lb (67.1 kg)     Height 02/26/22 1230 '5\' 10"'$  (1.778 m)     Head Circumference --      Peak Flow --      Pain Score 02/26/22 1230 0     Pain Loc --      Pain Edu? --      Excl. in [place]? --    Most recent vital signs: Vitals:   02/26/22 1630 02/26/22 1652  BP: (!) 142/119   Pulse: 66   Resp: 12   Temp:  97.9 F (36.6 C)  SpO2: 94%    General: Awake, oriented x4. CV:  Good peripheral perfusion.  Resp:  Normal effort.  Rales over bilateral lung fields that clear with coughing Abd:  No distention.  Other:  Elderly Caucasian male laying in bed in no acute distress ED Results / Procedures / Treatments  Labs (all labs ordered are listed, but only abnormal results are displayed) Labs Reviewed  BASIC METABOLIC PANEL - Abnormal;  Notable for the following components:      Result Value   CO2 21 (*)    Glucose, Bld 206 (*)    BUN 37 (*)    Creatinine, Ser 1.67 (*)    GFR, Estimated 40 (*)    All other components within normal limits  CBC - Abnormal; Notable for the following components:   WBC 15.9 (*)    RBC 3.98 (*)    MCV 105.3 (*)    All other components within normal limits  URINALYSIS, ROUTINE W REFLEX MICROSCOPIC - Abnormal; Notable for the following components:   Color, Urine YELLOW (*)    APPearance CLEAR (*)    Glucose, UA >=500 (*)    All other components within normal limits  HEPATIC FUNCTION PANEL - Abnormal; Notable for the following components:   Total Protein 6.2 (*)    Albumin 3.3 (*)    All other components within normal limits  TROPONIN I (HIGH SENSITIVITY) - Abnormal; Notable for the following components:   Troponin I (High Sensitivity) 44 (*)    All other components within normal limits  T4, FREE   EKG ED ECG REPORT I, Naaman Plummer, the attending physician, personally viewed and interpreted  this ECG. Date: 02/28/2022 EKG Time: 1242 Rate: 66 Rhythm: normal sinus rhythm QRS Axis: normal Intervals: normal ST/T Wave abnormalities: normal Narrative Interpretation: no evidence of acute ischemia RADIOLOGY ED MD interpretation: 2 view chest x-ray interpreted by me shows no evidence of acute abnormalities including no pneumonia, pneumothorax, or widened mediastinum -Agree with radiology assessment Official radiology report(s): No results found. PROCEDURES: Critical Care performed: No .1-3 Lead EKG Interpretation  Performed by: Naaman Plummer, MD Authorized by: Naaman Plummer, MD     Interpretation: normal     ECG rate:  65   ECG rate assessment: normal     Rhythm: sinus rhythm     Ectopy: none     Conduction: normal    MEDICATIONS ORDERED IN ED: Medications - No data to display IMPRESSION / MDM / [place] / ED COURSE  I reviewed the triage vital signs and the  nursing notes.                             The patient is on the cardiac monitor to evaluate for evidence of arrhythmia and/or significant heart rate changes. Patient's presentation is most consistent with acute presentation with potential threat to life or bodily function. Otherwise healthy patient presenting with constellation of symptoms likely representing uncomplicated viral upper respiratory symptoms as characterized by mild pharyngitis  Unlikely PTA/RPA: no hot potato voice, no uvular deviation, Unlikely Esophageal rupture: No history of dysphagia Unlikely deep space infection/Ludwigs Low suspicion for CNS infection bacterial sinusitis, or pneumonia given exam and history.  Unlikely Strep or EBV as centor negative and with no pharyngeal exudate, posterior LAD, or splenomegaly.  Will attempt to alleviate symptoms conservatively. No respiratory distress, otherwise relatively well appearing and nontoxic. Will discuss prompt follow up with PMD and strict return precautions.   FINAL CLINICAL IMPRESSION(S) / ED DIAGNOSES   Final diagnoses:  Productive cough  Pharyngitis, unspecified etiology  Generalized weakness   Rx / DC Orders   ED Discharge Orders          Ordered    azithromycin (ZITHROMAX Z-PAK) 250 MG tablet        02/26/22 1709           Note:  This document was prepared using Dragon voice recognition software and may include unintentional dictation errors.   Naaman Plummer, MD 02/28/22 [phone]

## 2022-03-13 ENCOUNTER — Encounter (HOSPITAL_COMMUNITY): Payer: Self-pay

## 2022-03-13 ENCOUNTER — Other Ambulatory Visit (HOSPITAL_COMMUNITY): Payer: Self-pay | Admitting: *Deleted

## 2022-03-13 DIAGNOSIS — Z01810 Encounter for preprocedural cardiovascular examination: Secondary | ICD-10-CM

## 2022-03-18 ENCOUNTER — Encounter (HOSPITAL_COMMUNITY): Payer: Self-pay

## 2022-03-19 ENCOUNTER — Telehealth (HOSPITAL_COMMUNITY): Payer: Self-pay | Admitting: *Deleted

## 2022-03-19 NOTE — Telephone Encounter (Signed)
Reaching out to patient to offer assistance regarding upcoming cardiac imaging study; pt verbalizes understanding of appt date/time, parking situation and where to check in, pre-test NPO status; name and call back number provided for further questions should they arise  Gordy Clement RN Navigator Cardiac Imaging [facility] [phone] office [phone] cell  Patient aware to avoid caffeine 12 hours prior to his stress MRI.  He is aware to arrive at 10:15am.

## 2022-03-20 ENCOUNTER — Other Ambulatory Visit (HOSPITAL_COMMUNITY): Payer: Self-pay | Admitting: Cardiology

## 2022-03-20 ENCOUNTER — Encounter: Payer: Self-pay | Admitting: Cardiology

## 2022-03-20 ENCOUNTER — Ambulatory Visit (HOSPITAL_COMMUNITY)
Admission: RE | Admit: 2022-03-20 | Discharge: 2022-03-20 | Disposition: A | Discharge status: Home / Self Care | Payer: Medicare HMO | Source: Ambulatory Visit | Attending: Cardiology | Admitting: Cardiology

## 2022-03-20 DIAGNOSIS — Z01810 Encounter for preprocedural cardiovascular examination: Secondary | ICD-10-CM | POA: Insufficient documentation

## 2022-03-20 DIAGNOSIS — I2510 Atherosclerotic heart disease of native coronary artery without angina pectoris: Secondary | ICD-10-CM | POA: Diagnosis present

## 2022-03-20 MED ORDER — REGADENOSON 0.4 MG/5ML IV SOLN
INTRAVENOUS | Status: AC
  Administered 2022-03-20: 0.4 mg via INTRAVENOUS
  Filled 2022-03-20: qty 5

## 2022-03-20 MED ORDER — ALBUTEROL SULFATE HFA 108 (90 BASE) MCG/ACT IN AERS
INHALATION_SPRAY | RESPIRATORY_TRACT | Status: AC
  Filled 2022-03-20: qty 6.7

## 2022-03-20 MED ORDER — NITROGLYCERIN 0.4 MG SL SUBL
SUBLINGUAL_TABLET | SUBLINGUAL | Status: AC
  Filled 2022-03-20: qty 1

## 2022-03-20 MED ORDER — GADOBUTROL 1 MMOL/ML IV SOLN
10.0000 mL | Freq: Once | INTRAVENOUS | Status: AC | PRN
Start: 2022-03-20 — End: 2022-03-20
  Administered 2022-03-20: 10 mL via INTRAVENOUS

## 2022-03-20 MED ORDER — METOPROLOL TARTRATE 5 MG/5ML IV SOLN
INTRAVENOUS | Status: AC
  Filled 2022-03-20: qty 5

## 2022-03-20 MED ORDER — AMINOPHYLLINE 25 MG/ML IV SOLN
INTRAVENOUS | Status: AC
  Filled 2022-03-20: qty 10

## 2022-03-20 MED ORDER — REGADENOSON 0.4 MG/5ML IV SOLN
0.4000 mg | Freq: Once | INTRAVENOUS | Status: AC
Start: 2022-03-20 — End: 2022-03-20
  Filled 2022-03-20: qty 5

## 2022-03-20 NOTE — Progress Notes (Signed)
Pre-Stress MRI BP and HR.

## 2022-03-20 NOTE — Progress Notes (Signed)
Patient presents for stress MRI.  BP 138/75, HR 70.  No caffeine intake in prior 12 hours. No wheezing on exam.  EKG today shows sinus rhythm with rate 70, LVH with repol abnormalities.   Shared Decision Making/Informed Consent The risks [chest pain, shortness of breath, cardiac arrhythmias, dizziness, blood pressure fluctuations, myocardial infarction, stroke/transient ischemic attack, nausea, vomiting, allergic reaction, and life-threatening complications (estimated to be 1 in 10,000)], benefits (risk stratification, diagnosing coronary artery disease, treatment guidance) and alternatives of a MRI stress test were discussed in detail with patient and they agree to proceed.

## 2022-03-20 NOTE — Progress Notes (Signed)
BP and HR one minute post Lexiscan administration. No symptoms noted. Dr. Su Grand at the scanner.

## 2022-04-11 ENCOUNTER — Other Ambulatory Visit: Payer: Self-pay | Admitting: Physician Assistant

## 2022-04-11 ENCOUNTER — Other Ambulatory Visit (HOSPITAL_COMMUNITY): Payer: Self-pay | Admitting: Physician Assistant

## 2022-04-11 DIAGNOSIS — R0609 Other forms of dyspnea: Secondary | ICD-10-CM

## 2022-04-26 ENCOUNTER — Ambulatory Visit (HOSPITAL_COMMUNITY)
Admission: RE | Admit: 2022-04-26 | Discharge: 2022-04-26 | Disposition: A | Discharge status: Home / Self Care | Payer: Medicare HMO | Source: Ambulatory Visit | Attending: Physician Assistant | Admitting: Physician Assistant

## 2022-04-26 DIAGNOSIS — R0609 Other forms of dyspnea: Secondary | ICD-10-CM | POA: Diagnosis present

## 2022-04-26 MED ORDER — TECHNETIUM TC 99M PYROPHOSPHATE
20.4000 | Freq: Once | INTRAVENOUS | Status: AC | PRN
  Administered 2022-04-26: 20.4 via INTRAVENOUS

## 2022-06-20 DIAGNOSIS — I440 Atrioventricular block, first degree: Secondary | ICD-10-CM | POA: Insufficient documentation

## 2022-06-25 DIAGNOSIS — D472 Monoclonal gammopathy: Secondary | ICD-10-CM | POA: Insufficient documentation

## 2022-08-21 IMAGING — CT CT ANGIO CHEST
2 of 7 series · 17 of 46 positions shown · IV contrast (APPLIED)
Comparison: CT angio chest 10/25/2021

CLINICAL DATA: Several weeks of shortness of breath and weakness.
Pain of the right ribs.

EXAM:
CT ANGIOGRAPHY CHEST WITH CONTRAST
TECHNIQUE: Multidetector CT imaging of the chest was performed using the
standard protocol during bolus administration of intravenous
contrast. Multiplanar CT image reconstructions and MIPs were
obtained to evaluate the vascular anatomy.

[Series 5: thins · axial · 0.80mm/px · z∈[-343,-93]mm · 14 of 348 slices shown]
[im 18/348  lung]
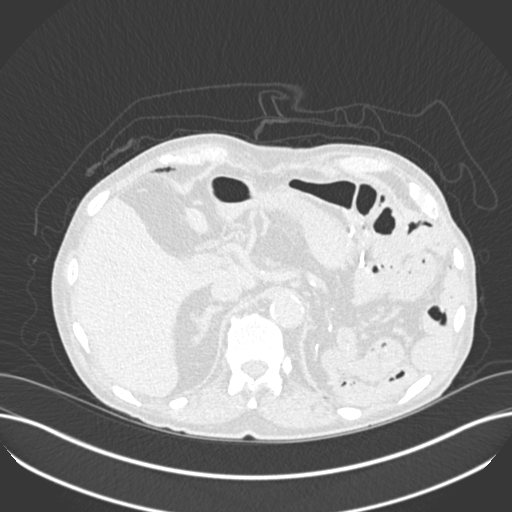
[im 53/348  soft-tissue]
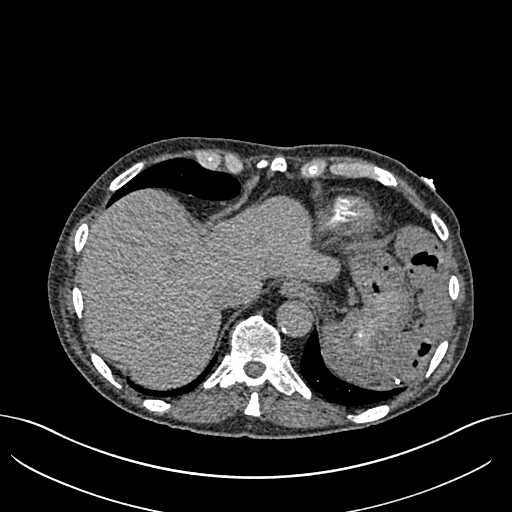
[im 70/348  lung]
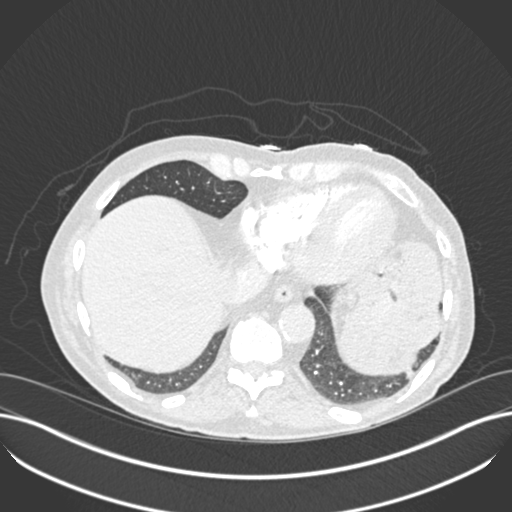
[im 87/348  soft-tissue]
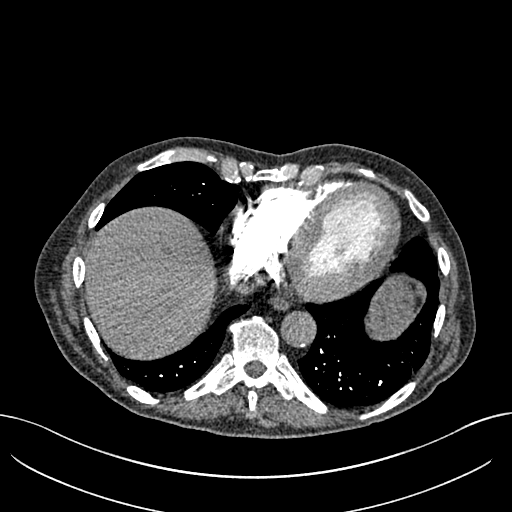
[im 122/348  lung]
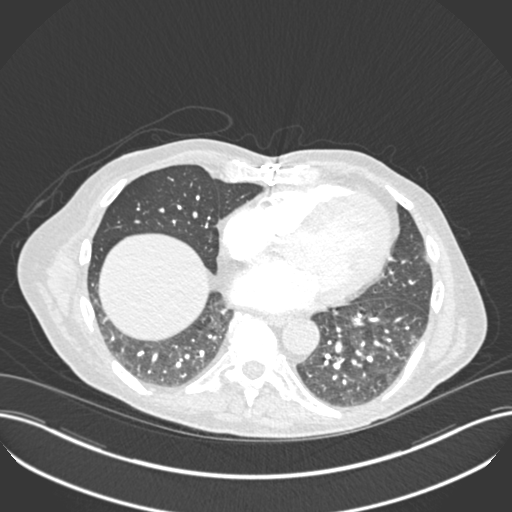
[im 139/348  soft-tissue]
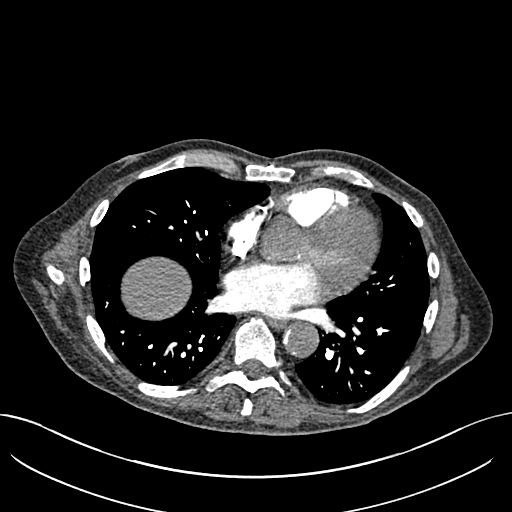
[im 157/348  lung]
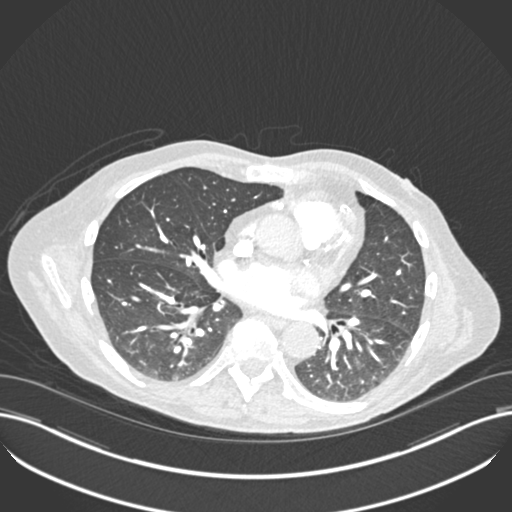
[im 191/348  soft-tissue]
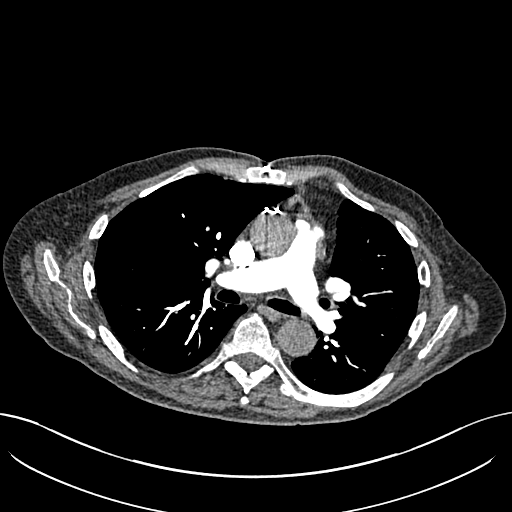
[im 209/348  lung]
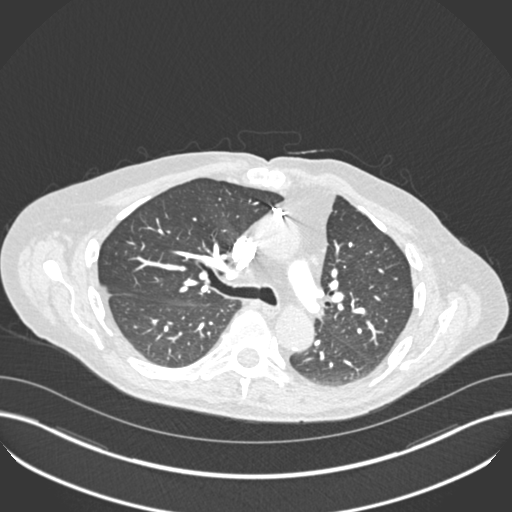
[im 226/348  soft-tissue]
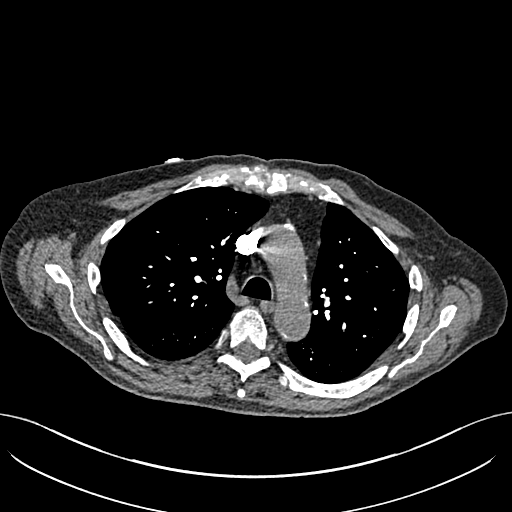
[im 261/348  lung]
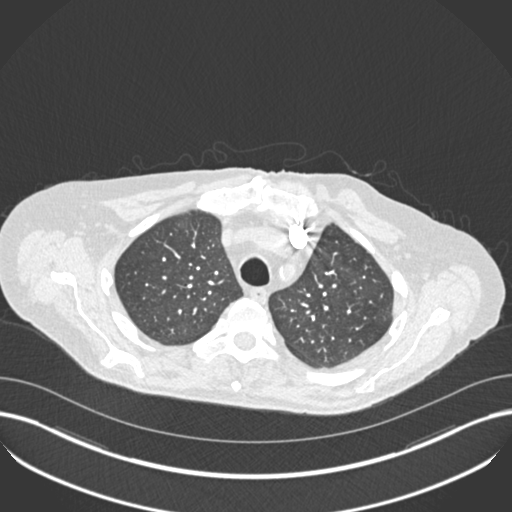
[im 278/348  soft-tissue]
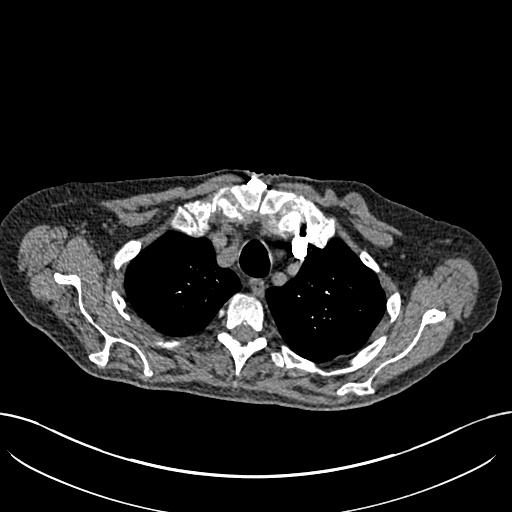
[im 295/348  lung]
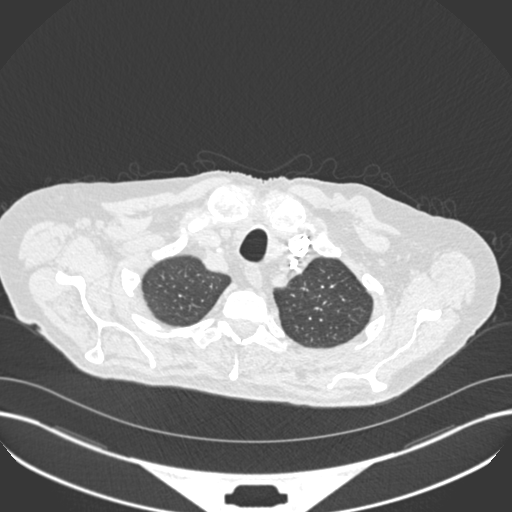
[im 330/348  soft-tissue]
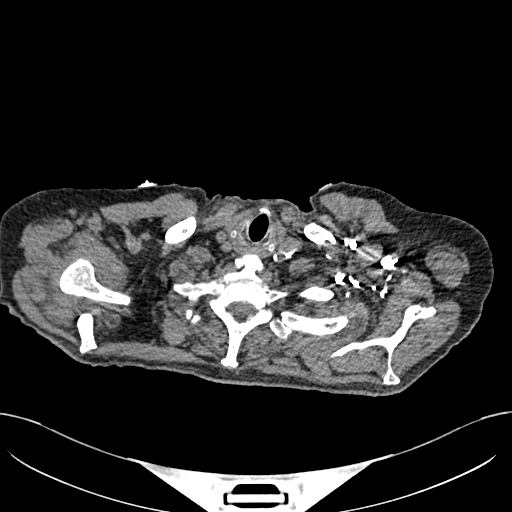

[Series 7: coronal mpr · coronal · 0.54mm/px · 3 of 81 slices shown]
[im 21/81  soft-tissue]
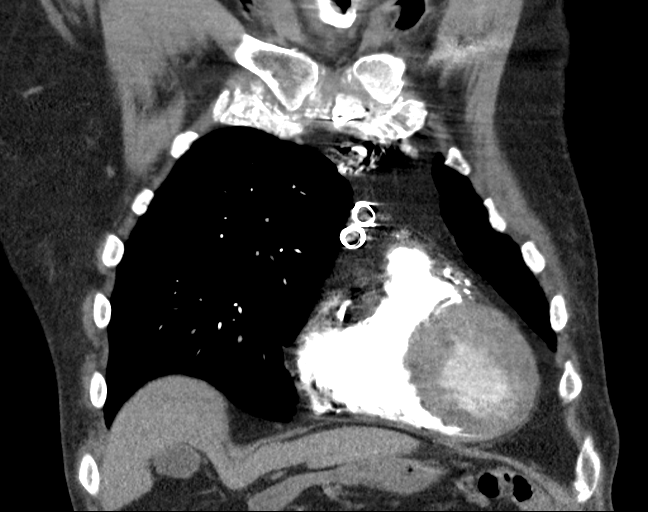
[im 41/81  soft-tissue]
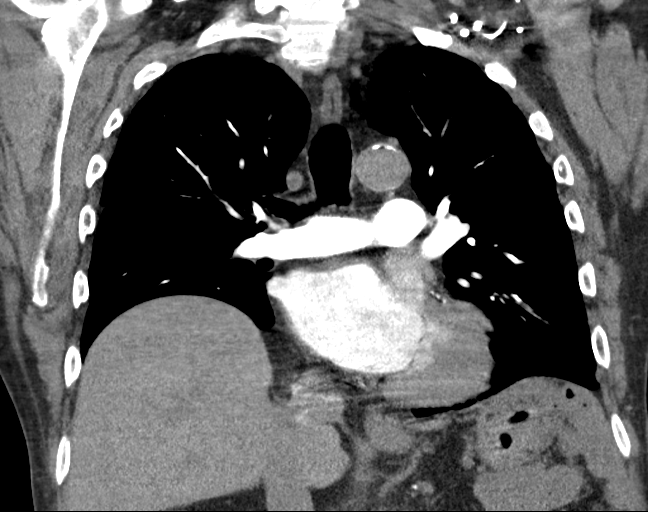
[im 61/81  soft-tissue]
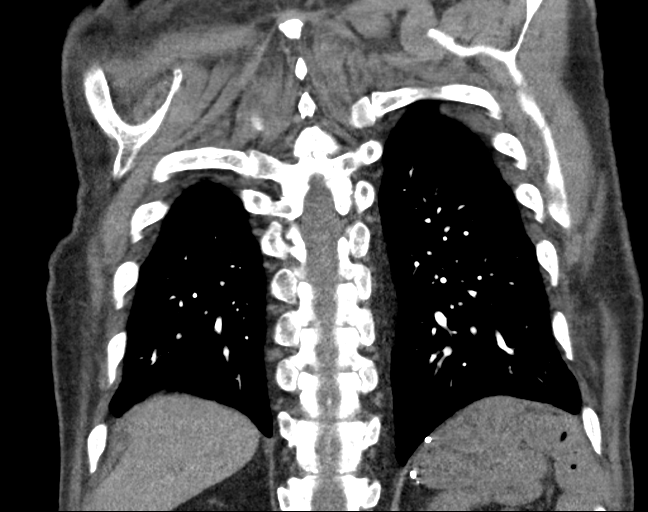

[17 of 46 positions shown; findings below may reference images not displayed]

RADIATION DOSE REDUCTION: This exam was performed according to the
departmental dose-optimization program which includes automated
exposure control, adjustment of the mA and/or kV according to
patient size and/or use of iterative reconstruction technique.

CONTRAST:  60mL OMNIPAQUE IOHEXOL 350 MG/ML SOLN
FINDINGS: Cardiovascular: Satisfactory opacification of the pulmonary arteries
to the segmental level. No evidence of pulmonary embolism. Normal
heart size. No pericardial effusion. Postoperative changes of median
sternotomy and coronary artery bypass graft. Coronary artery stent.
Multivessel coronary artery calcifications. Scattered calcific
aortic atherosclerosis.

Mediastinum/Nodes: No enlarged mediastinal, hilar, or axillary lymph
nodes. Thyroid gland, trachea, and esophagus demonstrate no
significant findings.

Lungs/Pleura: Subsegmental atelectasis at the left lung base. No new
focal consolidation, pleural effusion, or pneumothorax. A 0.6 cm
subpleural nodule at the left lung base (series 6, image 74) and
cm nodule in the superior segment of the right lower lobe (series 6,
image 33) are stable compared to 12/02/2020. An additional 0.4 cm
solid nodule in the right upper lobe is also stable compared to
12/02/2020 (series 6, image 46).

Upper Abdomen: No acute abnormality. Post surgical changes of
splenectomy.

Musculoskeletal: No chest wall abnormality. No acute or suspicious
osseous findings.

Review of the MIP images confirms the above findings.
IMPRESSION: 1. No acute intrathoracic abnormality. Specifically, no pulmonary
embolism as queried.
2. Multiple solid pulmonary nodules measuring up to 0.6 cm,
unchanged compared to 12/02/2020. Given the stability of the
nodules, follow-up noncontrast CT chest in 1 year is considered
optional for low-risk patients, but is recommended for high-risk
patients. This recommendation follows the consensus statement:
Guidelines for Management of Incidental Pulmonary Nodules Detected
3. Multivessel coronary artery disease status post CABG and coronary
artery stent. Aortic Atherosclerosis ([id number].H).

## 2022-09-18 DIAGNOSIS — C4491 Basal cell carcinoma of skin, unspecified: Secondary | ICD-10-CM

## 2022-09-18 HISTORY — DX: Basal cell carcinoma of skin, unspecified: C44.91

## 2022-10-15 IMAGING — DX DG CHEST 1V PORT
1 series · 1 of 1 positions shown · non-contrast
Comparison: 11/23/2021

CLINICAL DATA: Hypotension.  Question pulmonary edema.

EXAM:
PORTABLE CHEST 1 VIEW

[chest ap]
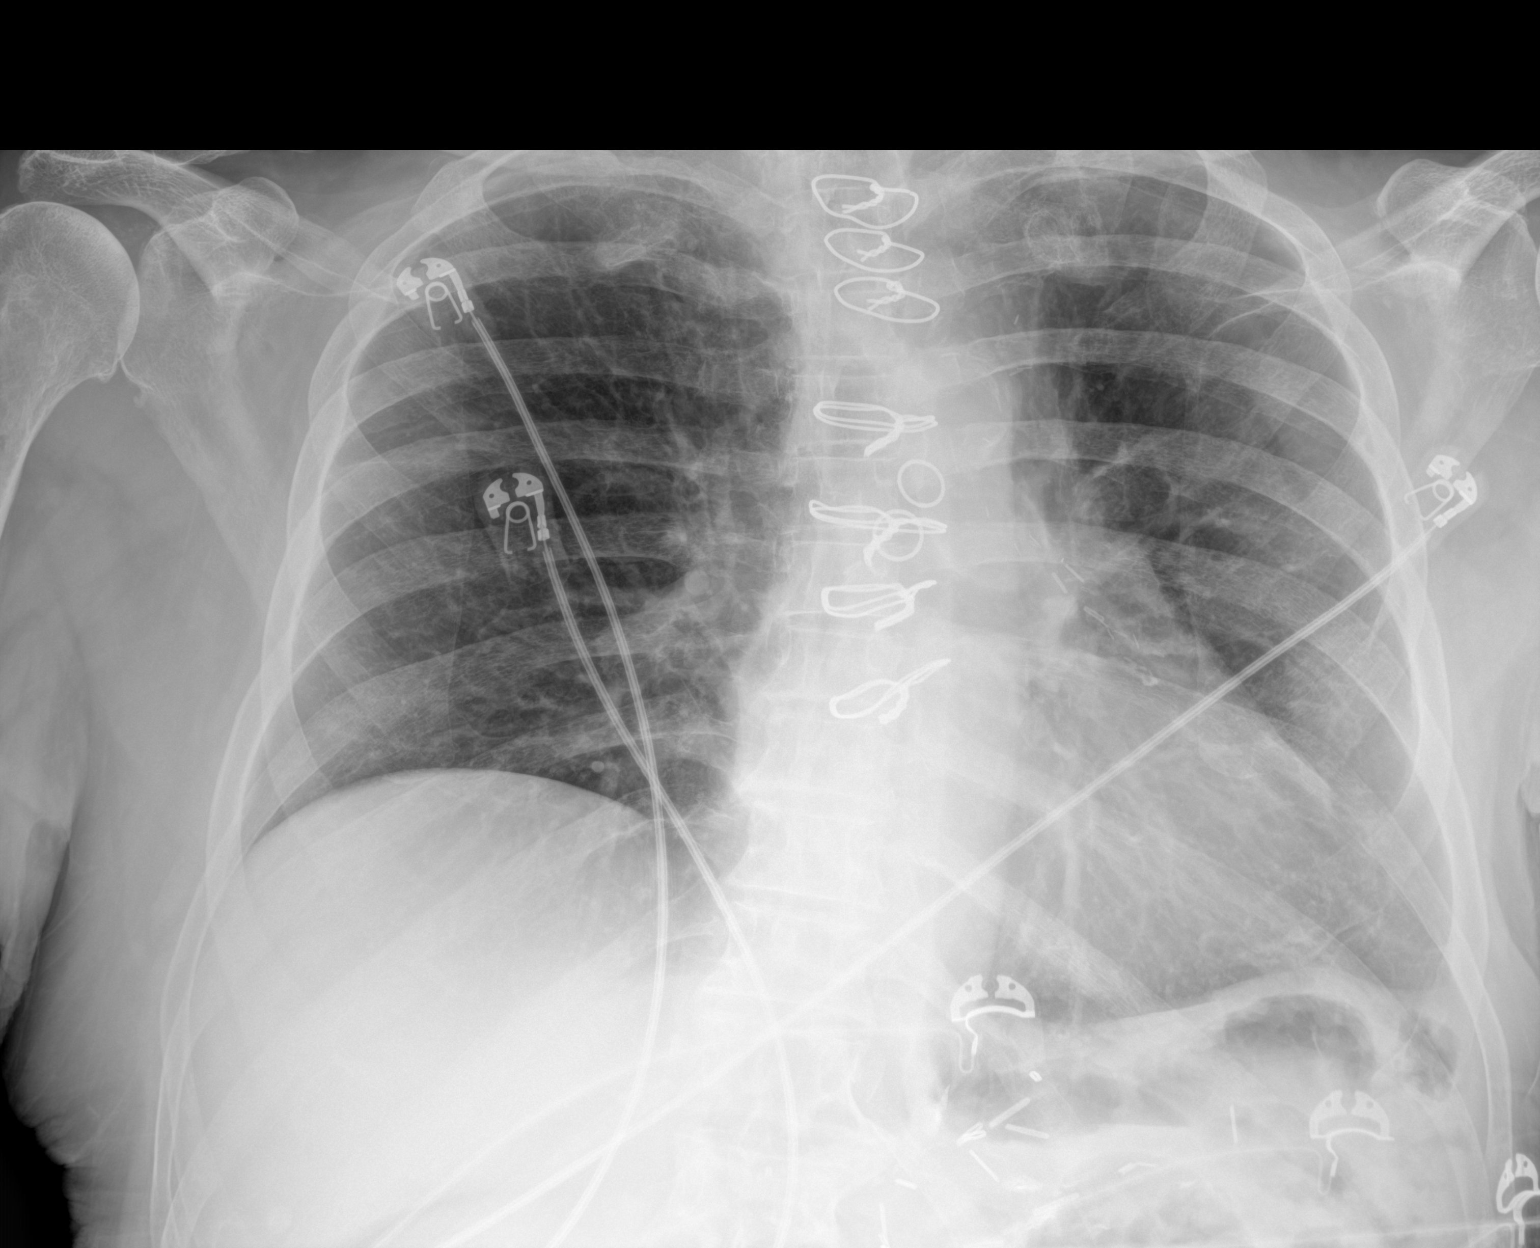

[1 of 1 positions shown; findings below may reference images not displayed]

FINDINGS: Previous median sternotomy and CABG. Heart size within normal
limits. Chronic aortic atherosclerotic calcification. Patient has
taken a poor inspiration. Allowing for that, the lungs are clear. No
edema or effusion. Mild chronic pleural scarring on the left.
IMPRESSION: Previous CABG.  Poor inspiration.  No active disease by radiography.

## 2023-01-24 ENCOUNTER — Ambulatory Visit
Admission: EM | Admit: 2023-01-24 | Discharge: 2023-01-24 | Disposition: A | Discharge status: Home / Self Care | Payer: Medicare HMO | Attending: Emergency Medicine | Admitting: Emergency Medicine

## 2023-01-24 ENCOUNTER — Encounter: Payer: Self-pay | Admitting: Emergency Medicine

## 2023-01-24 ENCOUNTER — Ambulatory Visit (INDEPENDENT_AMBULATORY_CARE_PROVIDER_SITE_OTHER): Payer: Medicare HMO

## 2023-01-24 DIAGNOSIS — W57XXXA Bitten or stung by nonvenomous insect and other nonvenomous arthropods, initial encounter: Secondary | ICD-10-CM

## 2023-01-24 DIAGNOSIS — S0086XA Insect bite (nonvenomous) of other part of head, initial encounter: Secondary | ICD-10-CM | POA: Diagnosis not present

## 2023-01-24 DIAGNOSIS — R911 Solitary pulmonary nodule: Secondary | ICD-10-CM

## 2023-01-24 DIAGNOSIS — R051 Acute cough: Secondary | ICD-10-CM

## 2023-01-24 DIAGNOSIS — J0100 Acute maxillary sinusitis, unspecified: Secondary | ICD-10-CM | POA: Diagnosis not present

## 2023-01-24 MED ORDER — IPRATROPIUM BROMIDE 0.06 % NA SOLN: 2.0000 | Freq: Four times a day (QID) | NASAL | 0 refills | Status: DC

## 2023-01-24 MED ORDER — AEROCHAMBER MV MISC: 1 refills | Status: DC

## 2023-01-24 MED ORDER — DOXYCYCLINE HYCLATE 100 MG PO CAPS: 100.0000 mg | ORAL_CAPSULE | Freq: Two times a day (BID) | ORAL | 0 refills | Status: AC

## 2023-01-24 NOTE — ED Provider Notes (Addendum)
HPI  SUBJECTIVE:  Daniel Bray is a 88 y.o. male who presents with 2 issues.  First, he reports 1 week of nasal congestion, sore throat, mild hoarseness, clear rhinorrhea, postnasal drip, cough productive of clear phlegm, wheezing.  No change in his baseline shortness of breath.  No sinus pain or pressure, facial swelling, upper dental pain, fevers, body aches, headaches, allergy or GERD symptoms.  He has been taking Xyzal daily, which he states is no longer working.  No aggravating or alleviating factors.  Second, he reports sustaining a wasp sting on his face and left torso 2 days ago.  He reports pain and itching.  Reports purulent drainage and an area of erythema for increasing size on the torso.  No tongue or lip swelling.  He tried topical Benadryl without improvement in his symptoms.  There are no aggravating factors.  He has a past medical history of amyloidosis, renal cell carcinoma status post nephrectomy, MI status post CABG, chronic kidney disease stage IIIb, hypertension, diabetes.  No history of pulmonary disease.  PCP: [facility].    Past Medical History:  Diagnosis Date   Cancer (HCC)    kidney   Cancer (HCC)    pancreas   Cancer (HCC)    colon    Diabetes mellitus without complication (HCC)    Hyperlipidemia    Hypertension    Myocardial infarction (HCC)    Streptococcal infection    Strep Bovis   Thyroid disease     Past Surgical History:  Procedure Laterality Date   CARDIAC ELECTROPHYSIOLOGY STUDY AND ABLATION     CARDIAC SURGERY     CORONARY ANGIOPLASTY WITH STENT PLACEMENT     CORONARY ARTERY BYPASS GRAFT     x3   LEFT HEART CATH AND CORONARY ANGIOGRAPHY N/A 08/03/2021   Procedure: LEFT HEART CATH AND CORONARY ANGIOGRAPHY;  Surgeon: Armando Reichert, MD;  Location: [facility] INVASIVE CV LAB;  Service: Cardiovascular;  Laterality: N/A;   PANCREAS SURGERY     RIGHT HEART CATH N/A 01/18/2022   Procedure: RIGHT HEART CATH;  Surgeon: Alwyn Pea,  MD;  Location: [facility] INVASIVE CV LAB;  Service: Cardiovascular;  Laterality: N/A;   SPLENECTOMY      Family History  Problem Relation Age of Onset   Pancreatic cancer Mother    Liver cancer Mother     Social History   Tobacco Use   Smoking status: Every Day    Types: Cigars   Smokeless tobacco: Current    Types: Chew  Vaping Use   Vaping Use: Never used  Substance Use Topics   Alcohol use: No   Drug use: No    No current facility-administered medications for this encounter.  Current Outpatient Medications:    aspirin EC 81 MG tablet, Take 81 mg by mouth daily., Disp: , Rfl:    carvedilol (COREG) 6.25 MG tablet, Take 1 tablet (6.25 mg total) by mouth 2 (two) times daily., Disp: 60 tablet, Rfl: 1   cholecalciferol (VITAMIN D) 1000 units tablet, Take 1,000 Units by mouth daily., Disp: , Rfl:    doxycycline (VIBRAMYCIN) 100 MG capsule, Take 1 capsule (100 mg total) by mouth 2 (two) times daily for 10 days., Disp: 20 capsule, Rfl: 0   gabapentin (NEURONTIN) 300 MG capsule, Take 300 mg by mouth at bedtime., Disp: , Rfl:    glipiZIDE (GLUCOTROL) 10 MG tablet, Take 10 mg by mouth 2 (two) times daily with a meal. , Disp: , Rfl:  ipratropium (ATROVENT) 0.06 % nasal spray, Place 2 sprays into both nostrils 4 (four) times daily., Disp: 15 mL, Rfl: 0   levothyroxine (SYNTHROID) 75 MCG tablet, Take 75 mcg by mouth daily before breakfast. , Disp: , Rfl:    nitroGLYCERIN (NITROSTAT) 0.4 MG SL tablet, Place under the tongue., Disp: , Rfl:    Spacer/Aero-Holding Chambers (AEROCHAMBER MV) inhaler, Use as instructed, Disp: 1 each, Rfl: 1   VYNDAMAX 61 MG CAPS, Take by mouth., Disp: , Rfl:    acetaminophen (TYLENOL) 650 MG CR tablet, Take 1,300 mg by mouth every 8 (eight) hours as needed for pain., Disp: , Rfl:    albuterol (VENTOLIN HFA) 108 (90 Base) MCG/ACT inhaler, Inhale 2 puffs into the lungs every 2 (two) hours as needed for wheezing or shortness of breath., Disp: 8 g, Rfl: 0   Calcium  Carbonate-Simethicone (ALKA-SELTZER HEARTBURN + GAS) 750-80 MG CHEW, Chew 2 each by mouth daily as needed (gas/heartburn)., Disp: , Rfl:    diclofenac Sodium (VOLTAREN) 1 % GEL, Apply 1 application topically daily as needed (pain)., Disp: , Rfl:    Dulaglutide 3 MG/0.5ML SOPN, Inject 3 mg into the skin every Monday., Disp: , Rfl:    empagliflozin (JARDIANCE) 25 MG TABS tablet, Take 25 mg by mouth daily., Disp: , Rfl:    furosemide (LASIX) 20 MG tablet, Take 1 tablet (20 mg total) by mouth daily. Do not take if your systolic BP is less than 110, Disp: 60 tablet, Rfl: 0   hydrALAZINE (APRESOLINE) 25 MG tablet, Take 25 mg by mouth 2 (two) times daily., Disp: , Rfl:    Inulin (FIBER CHOICE PO), Take 5 tablets by mouth with breakfast, with lunch, and with evening meal., Disp: , Rfl:    levocetirizine (XYZAL) 5 MG tablet, Take 5 mg by mouth every evening., Disp: , Rfl:    Melatonin 10 MG TABS, Take 10 mg by mouth at bedtime as needed (sleep)., Disp: , Rfl:    Multiple Vitamins-Minerals (PRESERVISION AREDS 2+MULTI VIT PO), Take 1 capsule by mouth in the morning and at bedtime., Disp: , Rfl:    omeprazole (PRILOSEC) 40 MG capsule, Take 40 mg by mouth daily. , Disp: , Rfl:    oxymetazoline (AFRIN) 0.05 % nasal spray, Place 1 spray into both nostrils at bedtime., Disp: , Rfl:    pyridoxine (B-6) 100 MG tablet, Take 100 mg by mouth daily., Disp: , Rfl:    rosuvastatin (CRESTOR) 40 MG tablet, Take 40 mg by mouth daily., Disp: , Rfl:   Allergies  Allergen Reactions   Levofloxacin Hives and Other (See Comments)   Levaquin [Levofloxacin In D5w]     Weakness, low blood pressure   Januvia [Sitagliptin] Rash     ROS  As noted in HPI.   Physical Exam  BP 135/80 (BP Location: Right Arm)   Pulse 78   Temp 97.9 F (36.6 C) (Oral)   Resp 16   SpO2 94%   Constitutional: Well developed, well nourished, no acute distress Eyes:  EOMI, conjunctiva normal bilaterally HENT: Normocephalic, atraumatic,mucus  membranes moist.  No nasal congestion, normal turbinates.  Positive maxillary sinus tenderness.  Tonsils surgically absent.  No appreciable postnasal drip. Neck: No cervical lymphadenopathy Respiratory: Normal inspiratory effort, fair air movement, wheezing bilateral bases. Cardiovascular: Normal rate, regular rhythm, no murmurs rubs or gallops GI: nondistended skin: Mildly tender erythematous mass with no expressible purulent drainage on right cheek.  7 x 2 cm area of blanchable nontender erythema with pustule on  left torso.     Musculoskeletal: no deformities Neurologic: Alert & oriented x 3, no focal neuro deficits Psychiatric: Speech and behavior appropriate   ED Course   Medications - No data to display  Orders Placed This Encounter  Procedures   DG Chest 2 View    Standing Status:   Standing    Number of Occurrences:   1    Order Specific Question:   Reason for Exam (SYMPTOM  OR DIAGNOSIS REQUIRED)    Answer:   Cough 1 week, wheezing bilateral bases rule out pneumonia    No results found for this or any previous visit (from the past 24 hour(s)). DG Chest 2 View  Result Date: 01/24/2023 CLINICAL DATA:  Cough for 1 week. EXAM: CHEST - 2 VIEW COMPARISON:  02/26/2022 FINDINGS: Linear atelectasis or scarring noted peripheral right lower lung. No edema or focal airspace consolidation. Lateral film demonstrates a nodular opacity superimposed on the posterior heart border. This cannot be localized on the frontal film. Status post CABG. No acute bony abnormality. IMPRESSION: 1. Nodular opacity superimposed on the posterior heart border on the lateral film. This cannot be localized on the frontal film. CT chest without contrast recommended to further evaluate. 2. Linear atelectasis or scarring in the peripheral right lower lung. Electronically Signed   By: Kennith Center M.D.   On: 01/24/2023 10:25    ED Clinical Impression  1. Acute non-recurrent maxillary sinusitis   2. Acute cough    3. Bug bite with infection, initial encounter   4. Insect bite of other part of head, initial encounter   5. Lung nodule seen on imaging study      ED Assessment/Plan     1.  Nasal congestion, sore throat, cough.  Checking chest x-ray to rule out pneumonia given multiple medical comorbidities.  Will contact patient at [phone] if abnormal and we will consider adding on a second antibiotic.  Using doxycycline because it will cover sinusitis, pneumonia and skin infections and can be safely used renal impairment.  Reviewed imaging independently.  No pneumonia.  Pulmonary nodule seen only in the lateral film.  Noncontrast CT chest recommended.  See radiology report for full details.  Sent patient MyChart message regarding pulmonary nodule and contacted him via phone.  Discussed with him the radiology results and emphasized importance of having this worked up to rule out malignancy.  In the meantime, we will treat a sinusitis with 10 days of doxycycline, Atrovent nasal spray, saline nasal irrigation.  Regularly scheduled albuterol inhaler with a spacer for 4 days, then as needed thereafter.  Patient states he does not need a prescription of albuterol.  2.  Wasp sting on face, left torso.  The sting on the torso appears to be infected.  Doxycycline for 10 days.  Soap and water.  Discussed imaging, MDM, treatment plan, and plan for follow-up with patient. Discussed sn/sx that should prompt return to the ED. patient agrees with plan.   Meds ordered this encounter  Medications   doxycycline (VIBRAMYCIN) 100 MG capsule    Sig: Take 1 capsule (100 mg total) by mouth 2 (two) times daily for 10 days.    Dispense:  20 capsule    Refill:  0   ipratropium (ATROVENT) 0.06 % nasal spray    Sig: Place 2 sprays into both nostrils 4 (four) times daily.    Dispense:  15 mL    Refill:  0   Spacer/Aero-Holding Chambers (AEROCHAMBER MV) inhaler  Sig: Use as instructed    Dispense:  1 each     Refill:  1      *This clinic note was created using Dragon dictation software. Therefore, there may be occasional mistakes despite careful proofreading.  ?    Domenick Gong, MD 01/24/23 1535    Domenick Gong, MD 01/24/23 1535

## 2023-01-24 NOTE — ED Triage Notes (Signed)
Pt presents with a sore throat and congestion x 1 week. Pt was also stung by a wasp on his right side 2 days ago.

## 2023-01-24 NOTE — Discharge Instructions (Addendum)
Finish doxycycline, even if you feel better.  2 puffs from your albuterol inhaler using your spacer every 4 hours for 2 days, then every 6 hours for 2 days, then as needed.  You can back off on the butyryl if he starts to improve sooner.  Atrovent nasal spray for the postnasal drip, saline nasal irrigation with a NeilMed sinus rinse and distilled water as often as you want.  Discontinue the Xyzal until you get better.  I will contact you if your x-ray is abnormal and I will consider adding on a second antibiotic for a pneumonia.

## 2023-04-03 ENCOUNTER — Other Ambulatory Visit: Payer: Self-pay | Admitting: Physician Assistant

## 2023-04-03 DIAGNOSIS — M4856XA Collapsed vertebra, not elsewhere classified, lumbar region, initial encounter for fracture: Secondary | ICD-10-CM

## 2023-04-10 DIAGNOSIS — E611 Iron deficiency: Secondary | ICD-10-CM | POA: Insufficient documentation

## 2023-04-15 ENCOUNTER — Encounter: Payer: Self-pay | Admitting: Physician Assistant

## 2023-04-22 ENCOUNTER — Ambulatory Visit
Admission: RE | Admit: 2023-04-22 | Discharge: 2023-04-22 | Disposition: A | Discharge status: Home / Self Care | Payer: Medicare HMO | Source: Ambulatory Visit | Attending: Physician Assistant | Admitting: Physician Assistant

## 2023-04-22 DIAGNOSIS — M4856XA Collapsed vertebra, not elsewhere classified, lumbar region, initial encounter for fracture: Secondary | ICD-10-CM

## 2023-06-30 ENCOUNTER — Other Ambulatory Visit: Payer: Self-pay | Admitting: Physician Assistant

## 2023-06-30 DIAGNOSIS — M12812 Other specific arthropathies, not elsewhere classified, left shoulder: Secondary | ICD-10-CM

## 2023-07-02 ENCOUNTER — Ambulatory Visit
Admission: RE | Admit: 2023-07-02 | Discharge: 2023-07-02 | Disposition: A | Discharge status: Home / Self Care | Payer: Medicare HMO | Source: Ambulatory Visit | Attending: Physician Assistant | Admitting: Physician Assistant

## 2023-07-02 DIAGNOSIS — M12812 Other specific arthropathies, not elsewhere classified, left shoulder: Secondary | ICD-10-CM | POA: Diagnosis present

## 2023-07-07 DIAGNOSIS — M75122 Complete rotator cuff tear or rupture of left shoulder, not specified as traumatic: Secondary | ICD-10-CM | POA: Insufficient documentation

## 2023-07-07 DIAGNOSIS — S46102A Unspecified injury of muscle, fascia and tendon of long head of biceps, left arm, initial encounter: Secondary | ICD-10-CM | POA: Insufficient documentation

## 2023-07-20 ENCOUNTER — Emergency Department: Payer: Medicare HMO

## 2023-07-20 ENCOUNTER — Other Ambulatory Visit: Payer: Self-pay

## 2023-07-20 ENCOUNTER — Inpatient Hospital Stay
Admission: EM | Admit: 2023-07-20 | Discharge: 2023-07-22 | DRG: 948 | Disposition: A | Discharge status: Home / Self Care | Payer: Medicare HMO | Attending: Internal Medicine | Admitting: Internal Medicine

## 2023-07-20 DIAGNOSIS — E1169 Type 2 diabetes mellitus with other specified complication: Secondary | ICD-10-CM | POA: Diagnosis present

## 2023-07-20 DIAGNOSIS — F419 Anxiety disorder, unspecified: Secondary | ICD-10-CM | POA: Diagnosis present

## 2023-07-20 DIAGNOSIS — Z8249 Family history of ischemic heart disease and other diseases of the circulatory system: Secondary | ICD-10-CM | POA: Diagnosis not present

## 2023-07-20 DIAGNOSIS — Z7984 Long term (current) use of oral hypoglycemic drugs: Secondary | ICD-10-CM | POA: Diagnosis not present

## 2023-07-20 DIAGNOSIS — K219 Gastro-esophageal reflux disease without esophagitis: Secondary | ICD-10-CM | POA: Diagnosis present

## 2023-07-20 DIAGNOSIS — Z800 Family history of malignant neoplasm of digestive organs: Secondary | ICD-10-CM

## 2023-07-20 DIAGNOSIS — Z951 Presence of aortocoronary bypass graft: Secondary | ICD-10-CM

## 2023-07-20 DIAGNOSIS — D72829 Elevated white blood cell count, unspecified: Secondary | ICD-10-CM | POA: Diagnosis not present

## 2023-07-20 DIAGNOSIS — N1831 Chronic kidney disease, stage 3a: Secondary | ICD-10-CM | POA: Diagnosis present

## 2023-07-20 DIAGNOSIS — E039 Hypothyroidism, unspecified: Secondary | ICD-10-CM | POA: Diagnosis present

## 2023-07-20 DIAGNOSIS — E871 Hypo-osmolality and hyponatremia: Secondary | ICD-10-CM | POA: Diagnosis not present

## 2023-07-20 DIAGNOSIS — Z79890 Hormone replacement therapy (postmenopausal): Secondary | ICD-10-CM | POA: Diagnosis not present

## 2023-07-20 DIAGNOSIS — E785 Hyperlipidemia, unspecified: Secondary | ICD-10-CM | POA: Diagnosis present

## 2023-07-20 DIAGNOSIS — R634 Abnormal weight loss: Secondary | ICD-10-CM | POA: Diagnosis present

## 2023-07-20 DIAGNOSIS — D472 Monoclonal gammopathy: Secondary | ICD-10-CM | POA: Diagnosis present

## 2023-07-20 DIAGNOSIS — I130 Hypertensive heart and chronic kidney disease with heart failure and stage 1 through stage 4 chronic kidney disease, or unspecified chronic kidney disease: Secondary | ICD-10-CM | POA: Diagnosis present

## 2023-07-20 DIAGNOSIS — R7989 Other specified abnormal findings of blood chemistry: Secondary | ICD-10-CM | POA: Diagnosis not present

## 2023-07-20 DIAGNOSIS — Z905 Acquired absence of kidney: Secondary | ICD-10-CM

## 2023-07-20 DIAGNOSIS — E1122 Type 2 diabetes mellitus with diabetic chronic kidney disease: Secondary | ICD-10-CM | POA: Diagnosis present

## 2023-07-20 DIAGNOSIS — I429 Cardiomyopathy, unspecified: Secondary | ICD-10-CM | POA: Diagnosis present

## 2023-07-20 DIAGNOSIS — C9140 Hairy cell leukemia not having achieved remission: Secondary | ICD-10-CM | POA: Diagnosis present

## 2023-07-20 DIAGNOSIS — Z7985 Long-term (current) use of injectable non-insulin antidiabetic drugs: Secondary | ICD-10-CM

## 2023-07-20 DIAGNOSIS — I2510 Atherosclerotic heart disease of native coronary artery without angina pectoris: Secondary | ICD-10-CM | POA: Diagnosis present

## 2023-07-20 DIAGNOSIS — R531 Weakness: Principal | ICD-10-CM | POA: Diagnosis present

## 2023-07-20 DIAGNOSIS — Z881 Allergy status to other antibiotic agents status: Secondary | ICD-10-CM

## 2023-07-20 DIAGNOSIS — Z9081 Acquired absence of spleen: Secondary | ICD-10-CM

## 2023-07-20 DIAGNOSIS — Z85528 Personal history of other malignant neoplasm of kidney: Secondary | ICD-10-CM | POA: Diagnosis not present

## 2023-07-20 DIAGNOSIS — Z7982 Long term (current) use of aspirin: Secondary | ICD-10-CM

## 2023-07-20 DIAGNOSIS — Z955 Presence of coronary angioplasty implant and graft: Secondary | ICD-10-CM

## 2023-07-20 DIAGNOSIS — I214 Non-ST elevation (NSTEMI) myocardial infarction: Secondary | ICD-10-CM | POA: Diagnosis present

## 2023-07-20 DIAGNOSIS — Z9049 Acquired absence of other specified parts of digestive tract: Secondary | ICD-10-CM

## 2023-07-20 DIAGNOSIS — Z681 Body mass index (BMI) 19 or less, adult: Secondary | ICD-10-CM | POA: Diagnosis not present

## 2023-07-20 DIAGNOSIS — F17290 Nicotine dependence, other tobacco product, uncomplicated: Secondary | ICD-10-CM | POA: Diagnosis present

## 2023-07-20 DIAGNOSIS — I252 Old myocardial infarction: Secondary | ICD-10-CM

## 2023-07-20 DIAGNOSIS — I5042 Chronic combined systolic (congestive) and diastolic (congestive) heart failure: Secondary | ICD-10-CM | POA: Diagnosis present

## 2023-07-20 DIAGNOSIS — Z8507 Personal history of malignant neoplasm of pancreas: Secondary | ICD-10-CM

## 2023-07-20 DIAGNOSIS — Z79899 Other long term (current) drug therapy: Secondary | ICD-10-CM

## 2023-07-20 DIAGNOSIS — Z888 Allergy status to other drugs, medicaments and biological substances status: Secondary | ICD-10-CM

## 2023-07-20 LAB — CBG MONITORING, ED
Glucose-Capillary: 118 mg/dL — ABNORMAL HIGH (ref 70–99)
Glucose-Capillary: 145 mg/dL — ABNORMAL HIGH (ref 70–99)

## 2023-07-20 LAB — URINALYSIS, ROUTINE W REFLEX MICROSCOPIC
Bacteria, UA: NONE SEEN
Bilirubin Urine: NEGATIVE
Glucose, UA: >=500 mg/dL — AB
Hgb urine dipstick: NEGATIVE
Ketones, ur: NEGATIVE mg/dL
Leukocytes,Ua: NEGATIVE
Nitrite: NEGATIVE
Protein, ur: NEGATIVE mg/dL
Specific Gravity, Urine: 1.021 (ref 1.005–1.030)
Squamous Epithelial / HPF: 0 /[HPF] (ref 0–5)
pH: 5 (ref 5.0–8.0)

## 2023-07-20 LAB — BASIC METABOLIC PANEL
Anion gap: 9 (ref 5–15)
BUN: 29 mg/dL — ABNORMAL HIGH (ref 8–23)
CO2: 26 mmol/L (ref 22–32)
Calcium: 11.4 mg/dL — ABNORMAL HIGH (ref 8.9–10.3)
Chloride: 99 mmol/L (ref 98–111)
Creatinine, Ser: 1.27 mg/dL — ABNORMAL HIGH (ref 0.61–1.24)
GFR, Estimated: 55 mL/min — ABNORMAL LOW (ref >60)
Glucose, Bld: 217 mg/dL — ABNORMAL HIGH (ref 70–99)
Potassium: 4.4 mmol/L (ref 3.5–5.1)
Sodium: 134 mmol/L — ABNORMAL LOW (ref 135–145)

## 2023-07-20 LAB — CBC
HCT: 43.3 % (ref 39.0–52.0)
Hemoglobin: 13.9 g/dL (ref 13.0–17.0)
MCH: 34.9 pg — ABNORMAL HIGH (ref 26.0–34.0)
MCHC: 32.1 g/dL (ref 30.0–36.0)
MCV: 108.8 fL — ABNORMAL HIGH (ref 80.0–100.0)
Platelets: 243 10*3/uL (ref 150–400)
RBC: 3.98 MIL/uL — ABNORMAL LOW (ref 4.22–5.81)
RDW: 15.2 % (ref 11.5–15.5)
WBC: 18.9 10*3/uL — ABNORMAL HIGH (ref 4.0–10.5)
nRBC: 0 % (ref 0.0–0.2)

## 2023-07-20 LAB — HEPATIC FUNCTION PANEL
ALT: 46 U/L — ABNORMAL HIGH (ref 0–44)
AST: 43 U/L — ABNORMAL HIGH (ref 15–41)
Albumin: 3.8 g/dL (ref 3.5–5.0)
Alkaline Phosphatase: 65 U/L (ref 38–126)
Bilirubin, Direct: 0.2 mg/dL (ref 0.0–0.2)
Indirect Bilirubin: 0.7 mg/dL (ref 0.3–0.9)
Total Bilirubin: 0.9 mg/dL (ref <1.2)
Total Protein: 6.5 g/dL (ref 6.5–8.1)

## 2023-07-20 LAB — TROPONIN I (HIGH SENSITIVITY)
Troponin I (High Sensitivity): 93 ng/L — ABNORMAL HIGH (ref <18)
Troponin I (High Sensitivity): 89 ng/L — ABNORMAL HIGH (ref <18)

## 2023-07-20 LAB — T4, FREE: Free T4: 0.83 ng/dL (ref 0.61–1.12)

## 2023-07-20 LAB — LACTIC ACID, PLASMA: Lactic Acid, Venous: 1.6 mmol/L (ref 0.5–1.9)

## 2023-07-20 LAB — TSH: TSH: 5.75 u[IU]/mL — ABNORMAL HIGH (ref 0.350–4.500)

## 2023-07-20 MED ORDER — MELATONIN 5 MG PO TABS: 5.0000 mg | ORAL_TABLET | Freq: Once | ORAL | Status: DC

## 2023-07-20 MED ORDER — HEPARIN SODIUM (PORCINE) 5000 UNIT/ML IJ SOLN
5000.0000 [IU] | Freq: Three times a day (TID) | INTRAMUSCULAR | Status: DC
  Administered 2023-07-20 – 2023-07-22 (×5): 5000 [IU] via SUBCUTANEOUS
  Filled 2023-07-20 (×5): qty 1

## 2023-07-20 MED ORDER — GABAPENTIN 300 MG PO CAPS
300.0000 mg | ORAL_CAPSULE | Freq: Every day | ORAL | Status: DC
  Administered 2023-07-20 – 2023-07-21 (×2): 300 mg via ORAL
  Filled 2023-07-20 (×2): qty 1

## 2023-07-20 MED ORDER — LEVOTHYROXINE SODIUM 50 MCG PO TABS
75.0000 ug | ORAL_TABLET | Freq: Every day | ORAL | Status: DC
Start: 2023-07-21 — End: 2023-07-22
  Administered 2023-07-21 – 2023-07-22 (×2): 75 ug via ORAL
  Filled 2023-07-20 (×2): qty 2

## 2023-07-20 MED ORDER — ONDANSETRON HCL 4 MG/2ML IJ SOLN
4.0000 mg | Freq: Four times a day (QID) | INTRAMUSCULAR | Status: DC | PRN
  Administered 2023-07-21: 4 mg via INTRAVENOUS
  Filled 2023-07-20: qty 2

## 2023-07-20 MED ORDER — ONDANSETRON HCL 4 MG PO TABS: 4.0000 mg | ORAL_TABLET | Freq: Four times a day (QID) | ORAL | Status: DC | PRN

## 2023-07-20 MED ORDER — ACETAMINOPHEN 325 MG RE SUPP: 650.0000 mg | Freq: Four times a day (QID) | RECTAL | Status: DC | PRN

## 2023-07-20 MED ORDER — ASPIRIN 81 MG PO CHEW
324.0000 mg | CHEWABLE_TABLET | Freq: Once | ORAL | Status: AC
  Administered 2023-07-20: 324 mg via ORAL
  Filled 2023-07-20: qty 4

## 2023-07-20 MED ORDER — INSULIN ASPART 100 UNIT/ML IJ SOLN
0.0000 [IU] | Freq: Every day | INTRAMUSCULAR | Status: DC
Start: 2023-07-20 — End: 2023-07-22
  Administered 2023-07-21: 2 [IU] via SUBCUTANEOUS
  Filled 2023-07-20: qty 1

## 2023-07-20 MED ORDER — CARVEDILOL 6.25 MG PO TABS
6.2500 mg | ORAL_TABLET | Freq: Two times a day (BID) | ORAL | Status: DC
  Administered 2023-07-20 – 2023-07-21 (×3): 6.25 mg via ORAL
  Filled 2023-07-20 (×3): qty 1

## 2023-07-20 MED ORDER — NITROGLYCERIN 0.4 MG SL SUBL: 0.4000 mg | SUBLINGUAL_TABLET | SUBLINGUAL | Status: DC | PRN

## 2023-07-20 MED ORDER — ACETAMINOPHEN 325 MG PO TABS: 650.0000 mg | ORAL_TABLET | Freq: Four times a day (QID) | ORAL | Status: DC | PRN

## 2023-07-20 MED ORDER — ROSUVASTATIN CALCIUM 20 MG PO TABS
40.0000 mg | ORAL_TABLET | Freq: Every day | ORAL | Status: DC
  Administered 2023-07-20 – 2023-07-21 (×2): 40 mg via ORAL
  Filled 2023-07-20 (×2): qty 2

## 2023-07-20 MED ORDER — CALCIUM POLYCARBOPHIL 625 MG PO TABS
625.0000 mg | ORAL_TABLET | Freq: Three times a day (TID) | ORAL | Status: DC
  Administered 2023-07-20 – 2023-07-22 (×4): 625 mg via ORAL
  Filled 2023-07-20 (×8): qty 1

## 2023-07-20 MED ORDER — MELATONIN 5 MG PO TABS
10.0000 mg | ORAL_TABLET | Freq: Once | ORAL | Status: AC
  Administered 2023-07-20: 10 mg via ORAL
  Filled 2023-07-20: qty 2

## 2023-07-20 MED ORDER — INSULIN ASPART 100 UNIT/ML IJ SOLN
0.0000 [IU] | Freq: Three times a day (TID) | INTRAMUSCULAR | Status: DC
  Administered 2023-07-22: 2 [IU] via SUBCUTANEOUS
  Filled 2023-07-20: qty 1

## 2023-07-20 NOTE — Assessment & Plan Note (Signed)
Home rosuvastatin 40 mg nightly resumed 

## 2023-07-20 NOTE — Assessment & Plan Note (Signed)
Advised patient to continue outpatient follow-up with cancer doctor at [facility] Patient endorses understanding and compliance

## 2023-07-20 NOTE — Hospital Course (Addendum)
[name] is a 88 year old male with history of CAD status post PCI in 199s and CABG in 1990s, hypertension, hyperlipidemia, non-insulin-dependent diabetes mellitus, MGUS, CLL, history of left renal cell cancer status post left nephrectomy, history of status post partial colectomy, GERD, hypothyroid, anxiety, CKD 3A, history of pancreatic cancer status post resection, history of small bowel obstruction, who presents emergency department for chief concerns of worsening weakness over the past few weeks.  Vitals in the ED showed temperature of 97.7, respiration rate of 18, heart rate 95, blood pressure 135/77, SpO2 of 98% on room air.  Serum sodium is 134, potassium 4.4, chloride 99, bicarb 26, BUN of 29, serum creatinine 1.27, EGFR 55, nonfasting blood glucose 217, WBC 18.9, hemoglobin 13.9, platelets of 243.  Troponin 93  ED treatment: Aspirin 324 mg p.o. once, echocardiogram was ordered and cardiology was consulted.  12/16: Vitals with mildly elevated blood pressure at 154/98, troponin with a flat curve, 93>>89, pending echocardiogram and stress test.  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  He follow-up with [facility] and advised to follow-up with them as soon as possible to rule out any recurrence.  No chest pain.

## 2023-07-20 NOTE — Assessment & Plan Note (Addendum)
We will check a second high sensitive troponin Complete echo ordered Cardiology specialist, Dr. Juliann Pares, has been consulted for consideration of stress testing Strict I's and O's Admit to telemetry cardiac, inpatient

## 2023-07-20 NOTE — ED Triage Notes (Signed)
Pt comes with c/o increased weakness for several weeks. Pt has been to his pcp with no answers. Pt also has some swelling and bruising noted to left arm. Pt takes aspirin daily. Pt stats his pcp told him it would eventually go away. Pt tore his bicep and rotator cuff and then the arm situation started.   Pt also states both his heels hurt. Pt states some sob. Pt denies any cp.

## 2023-07-20 NOTE — ED Provider Notes (Signed)
[facility] Provider Note    Event Date/Time   First MD Initiated Contact with Patient 07/20/23 1501     (approximate)   History   Chief Complaint Weakness   HPI  Daniel Bray is a 88 y.o. male with past medical history of hypertension, hyperlipidemia, diabetes, CAD status post CABG, and CLL who presents to the ED complaining of weakness.  Patient reports that for the past 2 weeks he has been dealing with significant fatigue that has limited his ability to perform ADLs.  He states that he is only able to walk from 1 side of a room to another before he has to stop and rest.  He denies feeling short of breath and has not had any pain in his chest, also denies any fevers or cough.  He states his appetite has been decreased but he has not had any nausea, vomiting, or diarrhea.  He also denies any dysuria or flank pain.  He reports being seen by his PCP for this problem with no clear explanation for his symptoms.  He has had significant swelling and bruising to his left arm, recently diagnosed with rotator cuff tear to his left shoulder and following with orthopedics.     Physical Exam   Triage Vital Signs: ED Triage Vitals  Encounter Vitals Group     BP 07/20/23 1223 135/77     Systolic BP Percentile --      Diastolic BP Percentile --      Pulse Rate 07/20/23 1223 95     Resp 07/20/23 1223 18     Temp 07/20/23 1223 98 F (36.7 C)     Temp Source 07/20/23 1453 Oral     SpO2 07/20/23 1223 98 %     Weight 07/20/23 1223 135 lb (61.2 kg)     Height 07/20/23 1223 5\' 10"  (1.778 m)     Head Circumference --      Peak Flow --      Pain Score 07/20/23 1223 4     Pain Loc --      Pain Education --      Exclude from Growth Chart --     Most recent vital signs: Vitals:   07/20/23 1453 07/20/23 1600  BP: (!) 153/92 (!) 141/78  Pulse: 83 78  Resp: (!) 34 14  Temp: 97.7 F (36.5 C)   SpO2: 99% 98%    Constitutional: Alert and oriented. Eyes:  Conjunctivae are normal. Head: Atraumatic. Nose: No congestion/rhinnorhea. Mouth/Throat: Mucous membranes are moist.  Cardiovascular: Normal rate, regular rhythm. Grossly normal heart sounds.  2+ radial pulses bilaterally. Respiratory: Normal respiratory effort.  No retractions. Lungs CTAB. Gastrointestinal: Soft and nontender. No distention. Musculoskeletal: No lower extremity tenderness nor edema.  Ecchymosis and edema to left forearm with no erythema, warmth, or tenderness.  Range of motion intact throughout left arm. Neurologic:  Normal speech and language. No gross focal neurologic deficits are appreciated.    ED Results / Procedures / Treatments   Labs (all labs ordered are listed, but only abnormal results are displayed) Labs Reviewed  BASIC METABOLIC PANEL - Abnormal; Notable for the following components:      Result Value   Sodium 134 (*)    Glucose, Bld 217 (*)    BUN 29 (*)    Creatinine, Ser 1.27 (*)    Calcium 11.4 (*)    GFR, Estimated 55 (*)    All other components within normal limits  CBC -  Abnormal; Notable for the following components:   WBC 18.9 (*)    RBC 3.98 (*)    MCV 108.8 (*)    MCH 34.9 (*)    All other components within normal limits  URINALYSIS, ROUTINE W REFLEX MICROSCOPIC - Abnormal; Notable for the following components:   Color, Urine YELLOW (*)    APPearance CLEAR (*)    Glucose, UA >=500 (*)    All other components within normal limits  TSH - Abnormal; Notable for the following components:   TSH 5.750 (*)    All other components within normal limits  HEPATIC FUNCTION PANEL - Abnormal; Notable for the following components:   AST 43 (*)    ALT 46 (*)    All other components within normal limits  CBG MONITORING, ED - Abnormal; Notable for the following components:   Glucose-Capillary 118 (*)    All other components within normal limits  TROPONIN I (HIGH SENSITIVITY) - Abnormal; Notable for the following components:   Troponin I (High  Sensitivity) 93 (*)    All other components within normal limits  T4, FREE     EKG  ED ECG REPORT I, Chesley Noon, the attending physician, personally viewed and interpreted this ECG.   Date: 07/20/2023  EKG Time: 14:55  Rate: 80  Rhythm: normal sinus rhythm  Axis: LAD  Intervals:nonspecific intraventricular conduction delay  ST&T Change: None  RADIOLOGY Chest x-ray reviewed and interpreted by me with no infiltrate, edema, or effusion.  PROCEDURES:  Critical Care performed: No  Procedures   MEDICATIONS ORDERED IN ED: Medications  aspirin chewable tablet 324 mg (324 mg Oral Given 07/20/23 1709)     IMPRESSION / MDM / ASSESSMENT AND PLAN / ED COURSE  I reviewed the triage vital signs and the nursing notes.                              88 y.o. male with past medical history of hypertension, hyperlipidemia, diabetes, CAD status post CABG, and CLL who presents to the ED with persistent fatigue for the past 2 weeks.  Patient's presentation is most consistent with acute presentation with potential threat to life or bodily function.  Differential diagnosis includes, but is not limited to, ACS, arrhythmia, anemia, electrolyte abnormality, AKI, UTI, stroke.  Patient well-appearing and in no acute distress, vital signs are unremarkable.  EKG shows no evidence of arrhythmia or ischemia and labs thus far are reassuring with stable CKD, no acute electrolyte abnormality or anemia noted.  Patient with chronic leukocytosis consistent with known history of CLL, no other symptoms to suggest infectious process.  Chest x-ray is unremarkable, urinalysis also with no signs of infection.  We will add on thyroid function studies as well as troponin, reassess.  No focal neurologic deficits to suggest stroke.  Thyroid studies are unremarkable, labs do show elevated troponin beyond his previous baseline.  With severe fatigue when attempting to walk, I am concerned this could be cardiac in  etiology.  Will give loading dose of aspirin and case discussed with hospitalist for admission.      FINAL CLINICAL IMPRESSION(S) / ED DIAGNOSES   Final diagnoses:  Generalized weakness  Elevated troponin     Rx / DC Orders   ED Discharge Orders     None        Note:  This document was prepared using Dragon voice recognition software and may include unintentional dictation errors.  Chesley Noon, MD 07/20/23 [phone]

## 2023-07-20 NOTE — Assessment & Plan Note (Signed)
 Home levothyroxine 75 mcg daily resumed on admission

## 2023-07-20 NOTE — Assessment & Plan Note (Addendum)
And PCI in the 90s Coreg 6.25 mg p.o. twice daily resumed; nitroglycerin 0.4 mg sublingual every 5 minutes as needed for chest pain ordered Patient is status post aspirin 324 mg p.o. one-time dose per EDP

## 2023-07-20 NOTE — ED Notes (Signed)
MD at bedside. 

## 2023-07-20 NOTE — H&P (Signed)
History and Physical   Daniel Bray:[id number] DOB: 07-04-1935 DOA: 07/20/2023  [facility]P: [facility]  Outpatient Specialists: Dr. Gillermo [facility] clinic cardiology Patient coming from: Home  I have personally briefly reviewed patient's old medical records in [facility] EMR.  Chief Concern: Weakness  HPI: [name] is a 88 year old male with history of CAD status post PCI in 199s and CABG in 1990s, hypertension, hyperlipidemia, non-insulin-dependent diabetes mellitus, MGUS, CLL, history of left renal cell cancer status post left nephrectomy, history of status post partial colectomy, GERD, hypothyroid, anxiety, CKD 3A, history of pancreatic cancer status post resection, history of small bowel obstruction, who presents emergency department for chief concerns of weakness.  Vitals in the ED showed temperature of 97.7, respiration rate of 18, heart rate 95, blood pressure 135/77, SpO2 of 98% on room air.  Serum sodium is 134, potassium 4.4, chloride 99, bicarb 26, BUN of 29, serum creatinine 1.27, EGFR 55, nonfasting blood glucose 217, WBC 18.9, hemoglobin 13.9, platelets of 243.  ED treatment: Aspirin 324 mg p.o. once ------------------------------- At bedside, patient is able to tell me his name, age, location, current calendar year.   He endorses generalized weakness for about 2-3 weeks. He denies trauma to his person.  He reports the weakness is worse with exertion.  He has no energy.  He reports that about a year ago he was started on Ozempic and then his cancer doctor told him that he needs to stop this.  The Ozempic was stopped about 6 months ago.  He reports that over the last year he has unintentionally lost about 15 to 20 pounds.  He reports that he is on several oral antiglycemic agents, including metformin, glipizide, Januvia, Jardiance.  Social history: He lives at home with his spouse. He usually smokes 1 cigar per day and has not done that  recently because it's been cold. He denies etoh and recreational drug use. [employer] for 25 and then his own [employer]  ROS: Constitutional: + weight change (loss), no fever ENT/Mouth: no sore throat, no rhinorrhea Eyes: no eye pain, no vision changes Cardiovascular: no chest pain, no dyspnea,  no edema, no palpitations Respiratory: no cough, no sputum, no wheezing Gastrointestinal: no nausea, no vomiting, no diarrhea, no constipation Genitourinary: no urinary incontinence, no dysuria, no hematuria Musculoskeletal: no arthralgias, no myalgias Skin: no skin lesions, no pruritus, Neuro: + weakness, no loss of consciousness, no syncope Psych: no anxiety, no depression, no decrease appetite Heme/Lymph: no bruising, no bleeding  ED Course: Discussed with EDP, patient requiring hospitalization for chief concerns of NSTEMI.  Assessment/Plan  Principal Problem:   NSTEMI (non-ST elevated myocardial infarction) (HCC) Active Problems:   Elevated troponin   CAD (coronary artery disease)   Type 2 diabetes mellitus with hyperlipidemia (HCC)   History of renal cell carcinoma s/p nephrectomy   Hypothyroidism   Leukocytosis   HLD (hyperlipidemia)   GERD (gastroesophageal reflux disease)   Anxiety   Hx of CABG   Weight loss Skin Assessment and Plan:  * NSTEMI (non-ST elevated myocardial infarction) (HCC) We will check a second high sensitive troponin Complete echo ordered Cardiology specialist, Dr. Juliann Pares, has been consulted for consideration of stress testing Strict I's and O's Admit to telemetry cardiac, inpatient  Weight loss Advised patient to continue outpatient follow-up with cancer doctor at [facility] Patient endorses understanding and compliance  Hx of CABG And PCI in the 90s Coreg 6.25 mg p.o. twice daily resumed; nitroglycerin 0.4 mg sublingual  every 5 minutes as needed for chest pain ordered Patient is status post aspirin 324 mg p.o. one-time dose per  EDP  HLD (hyperlipidemia) Home rosuvastatin 40 mg nightly resumed  Leukocytosis Check CBC in a.m.  Hypothyroidism Home levothyroxine 75 mcg daily resumed on admission  Type 2 diabetes mellitus with hyperlipidemia (HCC) Home oral antiglycemic agents will not be resumed on admission Insulin SSI with at bedtime coverage ordered Goal inpatient blood glucose levels 140-180  Chart reviewed.   DVT prophylaxis: Heparin 5000 units subcutaneous every 8 hours Code Status: full code Diet: Heart healthy diet Family Communication: updated spouse at bedside with patient's permission Disposition Plan: Pending clinical course Consults called: Cardiology service, Dr. Juliann Pares Admission status: Inpatient, telemetry cardiac  Past Medical History:  Diagnosis Date   Cancer [facility])    kidney   Cancer (HCC)    pancreas   Cancer (HCC)    colon    Diabetes mellitus without complication (HCC)    Hyperlipidemia    Hypertension    Myocardial infarction (HCC)    Streptococcal infection    Strep Bovis   Thyroid disease    Past Surgical History:  Procedure Laterality Date   CARDIAC ELECTROPHYSIOLOGY STUDY AND ABLATION     CARDIAC SURGERY     CORONARY ANGIOPLASTY WITH STENT PLACEMENT     CORONARY ARTERY BYPASS GRAFT     x3   LEFT HEART CATH AND CORONARY ANGIOGRAPHY N/A 08/03/2021   Procedure: LEFT HEART CATH AND CORONARY ANGIOGRAPHY;  Surgeon: Armando Reichert, MD;  Location: [facility] INVASIVE CV LAB;  Service: Cardiovascular;  Laterality: N/A;   PANCREAS SURGERY     RIGHT HEART CATH N/A 01/18/2022   Procedure: RIGHT HEART CATH;  Surgeon: Alwyn Pea, MD;  Location: [facility] INVASIVE CV LAB;  Service: Cardiovascular;  Laterality: N/A;   SPLENECTOMY     Social History:  reports that he has been smoking cigars. He uses smokeless tobacco. He reports that he does not drink alcohol and does not use drugs.  Allergies  Allergen Reactions   Levofloxacin Hives and Other (See Comments)   Levaquin  [Levofloxacin In D5w]     Weakness, low blood pressure   Januvia [Sitagliptin] Rash   Family History  Problem Relation Age of Onset   Pancreatic cancer Mother    Liver cancer Mother    Heart disease Mother    Family history: Family history reviewed and not pertinent.  Prior to Admission medications   Medication Sig Start Date End Date Taking? Authorizing Provider  acetaminophen (TYLENOL) 650 MG CR tablet Take 1,300 mg by mouth every 8 (eight) hours as needed for pain.    [provider]  albuterol (VENTOLIN HFA) 108 (90 Base) MCG/ACT inhaler Inhale 2 puffs into the lungs every 2 (two) hours as needed for wheezing or shortness of breath. 11/24/21   Enedina Finner, MD  aspirin EC 81 MG tablet Take 81 mg by mouth daily.    [provider]  Calcium Carbonate-Simethicone (ALKA-SELTZER HEARTBURN + GAS) 750-80 MG CHEW Chew 2 each by mouth daily as needed (gas/heartburn).    [provider]  carvedilol (COREG) 6.25 MG tablet Take 1 tablet (6.25 mg total) by mouth 2 (two) times daily. 01/18/22 01/24/23  Tresa Moore, MD  cholecalciferol (VITAMIN D) 1000 units tablet Take 1,000 Units by mouth daily.    [provider]  diclofenac Sodium (VOLTAREN) 1 % GEL Apply 1 application topically daily as needed (pain).    [provider]  Dulaglutide 3 MG/0.5ML SOPN Inject 3 mg into the skin every Monday.    [provider]  empagliflozin (JARDIANCE) 25 MG TABS tablet Take 25 mg by mouth daily.    [provider]  furosemide (LASIX) 20 MG tablet Take 1 tablet (20 mg total) by mouth daily. Do not take if your systolic BP is less than 110 01/19/22 03/20/22  Sreenath, Sudheer B, MD  gabapentin (NEURONTIN) 300 MG capsule Take 300 mg by mouth at bedtime.    [provider]  glipiZIDE (GLUCOTROL) 10 MG tablet Take 10 mg by mouth 2 (two) times daily with a meal.     [provider]  hydrALAZINE (APRESOLINE) 25 MG tablet Take 25 mg by mouth  2 (two) times daily.    [provider]  Inulin (FIBER CHOICE PO) Take 5 tablets by mouth with breakfast, with lunch, and with evening meal.    [provider]  ipratropium (ATROVENT) 0.06 % nasal spray Place 2 sprays into both nostrils 4 (four) times daily. 01/24/23   Domenick Gong, MD  levocetirizine (XYZAL) 5 MG tablet Take 5 mg by mouth every evening. 11/27/20   [provider]  levothyroxine (SYNTHROID) 75 MCG tablet Take 75 mcg by mouth daily before breakfast.     [provider]  Melatonin 10 MG TABS Take 10 mg by mouth at bedtime as needed (sleep).    [provider]  Multiple Vitamins-Minerals (PRESERVISION AREDS 2+MULTI VIT PO) Take 1 capsule by mouth in the morning and at bedtime.    [provider]  nitroGLYCERIN (NITROSTAT) 0.4 MG SL tablet Place under the tongue. 10/14/22 10/14/23  [provider]  omeprazole (PRILOSEC) 40 MG capsule Take 40 mg by mouth daily.     [provider]  oxymetazoline (AFRIN) 0.05 % nasal spray Place 1 spray into both nostrils at bedtime.    [provider]  pyridoxine (B-6) 100 MG tablet Take 100 mg by mouth daily.    [provider]  rosuvastatin (CRESTOR) 40 MG tablet Take 40 mg by mouth daily.    [provider]  Spacer/Aero-Holding Chambers (AEROCHAMBER MV) inhaler Use as instructed 01/24/23   Domenick Gong, MD  [facility] 61 MG CAPS Take by mouth. 07/19/22   [provider]   Physical Exam: Vitals:   07/20/23 1223 07/20/23 1453 07/20/23 1600 07/20/23 1846  BP: 135/77 (!) 153/92 (!) 141/78   Pulse: 95 83 78   Resp: 18 (!) 34 14   Temp: 98 F (36.7 C) 97.7 F (36.5 C)  97.7 F (36.5 C)  TempSrc:  Oral  Oral  SpO2: 98% 99% 98%   Weight: 61.2 kg     Height: 5\' 10"  (1.778 m)      Constitutional: appears frail, cachectic appearing, NAD, calm Eyes: PERRL, lids and conjunctivae normal ENMT: Mucous membranes are moist. Posterior pharynx  clear of any exudate or lesions. Age-appropriate dentition. Hearing appropriate Neck: normal, supple, no masses, no thyromegaly Respiratory: clear to auscultation bilaterally, no wheezing, no crackles. Normal respiratory effort. No accessory muscle use.  Cardiovascular: Regular rate and rhythm, no murmurs / rubs / gallops. No extremity edema. 2+ pedal pulses. No carotid bruits.  Abdomen: Scaphoid abdomen, no tenderness, no masses palpated, no hepatosplenomegaly. Bowel sounds positive.  Musculoskeletal: no clubbing / cyanosis. No joint deformity upper and lower extremities. Good ROM, no contractures, no atrophy. Normal muscle tone.  Skin: no rashes, lesions, ulcers. No induration Neurologic: Sensation intact. Strength 5/5 in all 4.  Psychiatric: Normal judgment and insight. Alert and oriented x 3. Normal mood.   EKG: independently reviewed, showing sinus rhythm with rate of 80, QTc 468  Chest x-ray on Admission: I personally reviewed and I agree with radiologist reading as below.  US Venous Img Upper Uni Left Result Date: 07/20/2023 CLINICAL DATA:  Left upper extremity bruising EXAM: LEFT UPPER EXTREMITY VENOUS DOPPLER ULTRASOUND TECHNIQUE: Gray-scale sonography with graded compression, as well as color Doppler and duplex ultrasound were performed to evaluate the upper extremity deep venous system from the level of the subclavian vein and including the jugular, axillary, basilic, radial, ulnar and upper cephalic vein. Spectral Doppler was utilized to evaluate flow at rest and with distal augmentation maneuvers. COMPARISON:  None Available. FINDINGS: Internal Jugular Vein: No evidence of thrombus. Normal compressibility, respiratory phasicity and response to augmentation. Subclavian Vein: No evidence of thrombus. Normal compressibility, respiratory phasicity and response to augmentation. Axillary Vein: No evidence of thrombus. Normal compressibility, respiratory phasicity and response to augmentation.  Cephalic Vein: No evidence of thrombus. Normal compressibility, respiratory phasicity and response to augmentation. Basilic Vein: No evidence of thrombus. Normal compressibility, respiratory phasicity and response to augmentation. Brachial Veins: No evidence of thrombus. Normal compressibility, respiratory phasicity and response to augmentation. Radial Veins: No evidence of thrombus. Normal compressibility, respiratory phasicity and response to augmentation. Ulnar Veins: No evidence of thrombus. Normal compressibility, respiratory phasicity and response to augmentation. Venous Reflux:  None visualized. Other Findings: A 3.4 x 1.2 x 5.1 cm heterogeneous hypoechoic collection is noted in the deep left shoulder soft tissues without internal vascularity, presumably related to the large left rotator cuff tear described on recent MRI. IMPRESSION: No evidence of DVT within the left upper extremity. Electronically Signed   By: Delbert Phenix M.D.   On: 07/20/2023 13:35   DG Chest 2 View Result Date: 07/20/2023 CLINICAL DATA:  sob EXAM: CHEST - 2 VIEW COMPARISON:  01/24/2023 FINDINGS: Lungs are clear. Heart size and mediastinal contours are within normal limits. Aortic Atherosclerosis (ICD10-170.0). CABG markers. No effusion. Sternotomy wires.  Surgical clips left upper abdomen. IMPRESSION: No acute cardiopulmonary disease. Electronically Signed   By: Corlis Leak M.D.   On: 07/20/2023 12:48    Labs on Admission: I have personally reviewed following labs CBC: Recent Labs  Lab 07/20/23 1225  WBC 18.9*  HGB 13.9  HCT 43.3  MCV 108.8*  PLT 243   Basic Metabolic Panel: Recent Labs  Lab 07/20/23 1225  NA 134*  K 4.4  CL 99  CO2 26  GLUCOSE 217*  BUN 29*  CREATININE 1.27*  CALCIUM 11.4*   GFR: Estimated Creatinine Clearance: 36.1 mL/min (A) (by C-G formula based on SCr of 1.27 mg/dL (H)).  Liver Function Tests: Recent Labs  Lab 07/20/23 1225  AST 43*  ALT 46*  ALKPHOS 65  BILITOT 0.9  PROT 6.5   ALBUMIN 3.8   CBG: Recent Labs  Lab 07/20/23 1459  GLUCAP 118*   Lipid Profile: No results for input(s): "CHOL", "HDL", "LDLCALC", "TRIG", "CHOLHDL", "LDLDIRECT" in the last 72 hours.  Thyroid Function Tests: Recent Labs    07/20/23 1225  TSH 5.750*  FREET4 0.83   Urine analysis:    Component Value Date/Time   COLORURINE YELLOW (A) 07/20/2023 1522   APPEARANCEUR CLEAR (A) 07/20/2023 1522   LABSPEC 1.021 07/20/2023 1522   PHURINE 5.0 07/20/2023 1522   GLUCOSEU >=500 (A) 07/20/2023 1522   HGBUR NEGATIVE 07/20/2023 1522   BILIRUBINUR NEGATIVE 07/20/2023 1522   KETONESUR NEGATIVE  07/20/2023 1522   PROTEINUR NEGATIVE 07/20/2023 1522   NITRITE NEGATIVE 07/20/2023 1522   LEUKOCYTESUR NEGATIVE 07/20/2023 1522   This document was prepared using Dragon Voice Recognition software and may include unintentional dictation errors.  Dr. Sedalia Muta [facility]  If 7PM-7AM, please contact overnight-coverage provider If 7AM-7PM, please contact day attending provider [email or web address]  07/20/2023, 6:57 PM

## 2023-07-20 NOTE — Assessment & Plan Note (Addendum)
Home oral antiglycemic agents will not be resumed on admission Insulin SSI with at bedtime coverage ordered Goal inpatient blood glucose levels 140-180

## 2023-07-20 NOTE — ED Notes (Signed)
Pt given dinner tray.

## 2023-07-20 NOTE — Assessment & Plan Note (Signed)
Check CBC in am

## 2023-07-21 ENCOUNTER — Inpatient Hospital Stay
Admit: 2023-07-21 | Discharge: 2023-07-21 | Disposition: A | Discharge status: Home / Self Care | Payer: Medicare HMO | Attending: Internal Medicine

## 2023-07-21 ENCOUNTER — Inpatient Hospital Stay: Payer: Medicare HMO

## 2023-07-21 DIAGNOSIS — Z85528 Personal history of other malignant neoplasm of kidney: Secondary | ICD-10-CM | POA: Diagnosis not present

## 2023-07-21 DIAGNOSIS — F419 Anxiety disorder, unspecified: Secondary | ICD-10-CM

## 2023-07-21 DIAGNOSIS — E785 Hyperlipidemia, unspecified: Secondary | ICD-10-CM

## 2023-07-21 DIAGNOSIS — R7989 Other specified abnormal findings of blood chemistry: Secondary | ICD-10-CM | POA: Diagnosis not present

## 2023-07-21 DIAGNOSIS — Z951 Presence of aortocoronary bypass graft: Secondary | ICD-10-CM

## 2023-07-21 DIAGNOSIS — E039 Hypothyroidism, unspecified: Secondary | ICD-10-CM

## 2023-07-21 DIAGNOSIS — E1169 Type 2 diabetes mellitus with other specified complication: Secondary | ICD-10-CM

## 2023-07-21 DIAGNOSIS — I214 Non-ST elevation (NSTEMI) myocardial infarction: Secondary | ICD-10-CM | POA: Diagnosis not present

## 2023-07-21 DIAGNOSIS — D72829 Elevated white blood cell count, unspecified: Secondary | ICD-10-CM

## 2023-07-21 DIAGNOSIS — R634 Abnormal weight loss: Secondary | ICD-10-CM

## 2023-07-21 LAB — NM MYOCAR MULTI W/SPECT W/WALL MOTION / EF
Base ST Depression (mm): 0 mm
Estimated workload: 1
Exercise duration (min): 1 min
LV dias vol: 148 mL (ref 62–150)
LV sys vol: 120 mL
MPHR: 134 {beats}/min
Nuc Stress EF: 29 %
Peak HR: 99 {beats}/min
Percent HR: 73 %
Rest HR: 78 {beats}/min
Rest Nuclear Isotope Dose: 9.5 mCi
SDS: 2
SRS: 10
SSS: 4
ST Depression (mm): 0 mm
Stress Nuclear Isotope Dose: 31.9 mCi
TID: 1.14

## 2023-07-21 LAB — HEMOGLOBIN A1C
Hgb A1c MFr Bld: 8.6 % — ABNORMAL HIGH (ref 4.8–5.6)
Mean Plasma Glucose: 200.12 mg/dL

## 2023-07-21 LAB — ECHOCARDIOGRAM COMPLETE
AR max vel: 2.54 cm2
AV Area VTI: 2.61 cm2
AV Area mean vel: 2.58 cm2
AV Mean grad: 2 mm[Hg]
AV Peak grad: 4.5 mm[Hg]
Ao pk vel: 1.06 m/s
Area-P 1/2: 8.92 cm2
Calc EF: 32.5 %
Est EF: 30
Height: 70 in
MV VTI: 2.3 cm2
S' Lateral: 3.8 cm
Single Plane A2C EF: 39.4 %
Single Plane A4C EF: 24.3 %
Weight: 2160 [oz_av]

## 2023-07-21 LAB — CBC
HCT: 43.3 % (ref 39.0–52.0)
Hemoglobin: 14.1 g/dL (ref 13.0–17.0)
MCH: 34.7 pg — ABNORMAL HIGH (ref 26.0–34.0)
MCHC: 32.6 g/dL (ref 30.0–36.0)
MCV: 106.7 fL — ABNORMAL HIGH (ref 80.0–100.0)
Platelets: 251 10*3/uL (ref 150–400)
RBC: 4.06 MIL/uL — ABNORMAL LOW (ref 4.22–5.81)
RDW: 15.2 % (ref 11.5–15.5)
WBC: 22.5 10*3/uL — ABNORMAL HIGH (ref 4.0–10.5)
nRBC: 0 % (ref 0.0–0.2)

## 2023-07-21 LAB — BASIC METABOLIC PANEL
Anion gap: 7 (ref 5–15)
BUN: 29 mg/dL — ABNORMAL HIGH (ref 8–23)
CO2: 24 mmol/L (ref 22–32)
Calcium: 11.1 mg/dL — ABNORMAL HIGH (ref 8.9–10.3)
Chloride: 102 mmol/L (ref 98–111)
Creatinine, Ser: 1.07 mg/dL (ref 0.61–1.24)
GFR, Estimated: >60 mL/min (ref >60)
Glucose, Bld: 143 mg/dL — ABNORMAL HIGH (ref 70–99)
Potassium: 4.8 mmol/L (ref 3.5–5.1)
Sodium: 133 mmol/L — ABNORMAL LOW (ref 135–145)

## 2023-07-21 LAB — CBG MONITORING, ED
Glucose-Capillary: 133 mg/dL — ABNORMAL HIGH (ref 70–99)
Glucose-Capillary: 118 mg/dL — ABNORMAL HIGH (ref 70–99)
Glucose-Capillary: 142 mg/dL — ABNORMAL HIGH (ref 70–99)
Glucose-Capillary: 235 mg/dL — ABNORMAL HIGH (ref 70–99)

## 2023-07-21 MED ORDER — ASPIRIN 81 MG PO TBEC
81.0000 mg | DELAYED_RELEASE_TABLET | Freq: Every day | ORAL | Status: DC
Start: 2023-07-21 — End: 2023-07-22
  Administered 2023-07-21: 81 mg via ORAL
  Filled 2023-07-21: qty 1

## 2023-07-21 MED ORDER — REGADENOSON 0.4 MG/5ML IV SOLN
0.4000 mg | Freq: Once | INTRAVENOUS | Status: AC
Start: 2023-07-21 — End: 2023-07-21
  Administered 2023-07-21: 0.4 mg via INTRAVENOUS

## 2023-07-21 MED ORDER — TECHNETIUM TC 99M TETROFOSMIN IV KIT
9.4600 | PACK | Freq: Once | INTRAVENOUS | Status: AC | PRN
  Administered 2023-07-21: 9.46 via INTRAVENOUS

## 2023-07-21 MED ORDER — TECHNETIUM TC 99M TETROFOSMIN IV KIT
31.8800 | PACK | Freq: Once | INTRAVENOUS | Status: AC | PRN
  Administered 2023-07-21: 31.88 via INTRAVENOUS

## 2023-07-21 NOTE — ED Notes (Signed)
Admitting MD at bedside.

## 2023-07-21 NOTE — ED Notes (Signed)
Pt transported to have stress test performed.

## 2023-07-21 NOTE — ED Notes (Signed)
Pt assisted to the restroom standby assist.

## 2023-07-21 NOTE — Consult Note (Addendum)
[facility] CLINIC CARDIOLOGY CONSULT NOTE       Patient ID: Daniel Bray MRN: [id number] DOB/AGE: [birth year] 88 y.o.  Admit date: 07/20/2023 Referring Physician Dr. Londell Moh Primary Physician [place], [facility]  Primary Cardiologist Dr. Darrold Junker Reason for Consultation weakness, elevated troponin  HPI: Daniel Bray is a 88 y.o. male  with a past medical history of coronary artery disease s/p CABG as well as stenting 1990s, hypertension, hyperlipidemia, type 2 diabetes, MGUS, CLL, left renal cancer s/p left nephrectomy, GERD, hypothyroidism, chronic kidney disease stage IIIa who presented to the ED on 07/20/2023 for weakness. Cardiology was consulted for further evaluation.   Patient presented to the ED yesterday for weakness.  States that this has been going on for several weeks.  His daughter states that it has maybe been worse over the last few days and so they decided to bring him into the ED for further evaluation.  Workup in the ED notable for creatinine 1.27, potassium 4.4, sodium 134, hemoglobin 13.9, WBC 18.9.  Troponins checked and trended 93 > 89.  Lactic acid 1.6.  EKG in the ED demonstrated NSR rate 91 bpm, nonacute. CXR without acute abnormality.   My evaluation this morning patient is resting comfortably in hospital bed with wife and daughter present at bedside.  We discussed his recent symptoms.  He states that his main complaint is progressive weakness.  He denies any lightheadedness, dizziness, episodes of syncope.  He denies any chest pain, states that he does not have any exertional symptoms.  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  Review of systems complete and found to be negative unless listed above    Past Medical History:  Diagnosis Date   Cancer (HCC)    kidney   Cancer (HCC)    pancreas   Cancer (HCC)    colon    Diabetes mellitus without complication (HCC)     Hyperlipidemia    Hypertension    Myocardial infarction (HCC)    Streptococcal infection    Strep Bovis   Thyroid disease     Past Surgical History:  Procedure Laterality Date   CARDIAC ELECTROPHYSIOLOGY STUDY AND ABLATION     CARDIAC SURGERY     CORONARY ANGIOPLASTY WITH STENT PLACEMENT     CORONARY ARTERY BYPASS GRAFT     x3   LEFT HEART CATH AND CORONARY ANGIOGRAPHY N/A 08/03/2021   Procedure: LEFT HEART CATH AND CORONARY ANGIOGRAPHY;  Surgeon: Armando Reichert, MD;  Location: [facility] INVASIVE CV LAB;  Service: Cardiovascular;  Laterality: N/A;   PANCREAS SURGERY     RIGHT HEART CATH N/A 01/18/2022   Procedure: RIGHT HEART CATH;  Surgeon: Alwyn Pea, MD;  Location: [facility] INVASIVE CV LAB;  Service: Cardiovascular;  Laterality: N/A;   SPLENECTOMY      (Not in a hospital admission)  Social History   Socioeconomic History   Marital status: Married    Spouse name: Not on file   Number of children: Not on file   Years of education: Not on file   Highest education level: Not on file  Occupational History   Not on file  Tobacco Use   Smoking status: Every Day    Types: Cigars   Smokeless tobacco: Current   Tobacco comments:    He smokes cigars occassionally  Vaping Use   Vaping status: Never Used  Substance and Sexual Activity   Alcohol use: No  Drug use: No   Sexual activity: Not Currently  Other Topics Concern   Not on file  Social History Narrative   Not on file   Social Drivers of Health   Financial Resource Strain: Low Risk  (06/03/2022)   Received from [facility], [facility]   Overall Financial Resource Strain (CARDIA)    Difficulty of Paying Living Expenses: Not hard at all  Food Insecurity: No Food Insecurity (06/03/2022)   Received from [facility], [facility]   Hunger Vital Sign    Worried About Running Out of Food in the Last Year: Never true    Ran Out of Food in the  Last Year: Never true  Transportation Needs: No Transportation Needs (06/03/2022)   Received from [facility], [facility]   [facility] - Transportation    In the past 12 months, has lack of transportation kept you from medical appointments or from getting medications?: No    Lack of Transportation (Non-Medical): No  Physical Activity: Not on file  Stress: Not on file  Social Connections: Not on file  Intimate Partner Violence: Not on file    Family History  Problem Relation Age of Onset   Pancreatic cancer Mother    Liver cancer Mother    Heart disease Mother      Vitals:   07/21/23 1000 07/21/23 1230 07/21/23 1530 07/21/23 1532  BP: (!) 144/81 134/85 (!) 140/69   Pulse: 83 79 83   Resp: 10 19 16    Temp:    97.6 F (36.4 C)  TempSrc:      SpO2: 95% 97% 96%   Weight:      Height:        PHYSICAL EXAM General: Chronically ill-appearing elderly male, well nourished, in no acute distress. HEENT: Normocephalic and atraumatic. Neck: No JVD.  Lungs: Normal respiratory effort on room air. Clear bilaterally to auscultation. No wheezes, crackles, rhonchi.  Heart: HRRR. Normal S1 and S2 without gallops or murmurs.  Abdomen: Non-distended appearing.  Msk: Normal strength and tone for age. Extremities: Warm and well perfused. No clubbing, cyanosis.  No edema.  Neuro: Alert and oriented X 3. Psych: Answers questions appropriately.   Labs: Basic Metabolic Panel: Recent Labs    07/20/23 1225 07/21/23 0432  NA 134* 133*  K 4.4 4.8  CL 99 102  CO2 26 24  GLUCOSE 217* 143*  BUN 29* 29*  CREATININE 1.27* 1.07  CALCIUM 11.4* 11.1*   Liver Function Tests: Recent Labs    07/20/23 1225  AST 43*  ALT 46*  ALKPHOS 65  BILITOT 0.9  PROT 6.5  ALBUMIN 3.8   No results for input(s): "LIPASE", "AMYLASE" in the last 72 hours. CBC: Recent Labs    07/20/23 1225 07/21/23 0432  WBC 18.9* 22.5*  HGB 13.9 14.1  HCT 43.3 43.3  MCV 108.8* 106.7*   PLT 243 251   Cardiac Enzymes: Recent Labs    07/20/23 1225 07/20/23 1730  TROPONINIHS 93* 89*   BNP: No results for input(s): "BNP" in the last 72 hours. D-Dimer: No results for input(s): "DDIMER" in the last 72 hours. Hemoglobin A1C: Recent Labs    07/21/23 0433  HGBA1C 8.6*   Fasting Lipid Panel: No results for input(s): "CHOL", "HDL", "LDLCALC", "TRIG", "CHOLHDL", "LDLDIRECT" in the last 72 hours. Thyroid Function Tests: Recent Labs    07/20/23 1225  TSH 5.750*   Anemia Panel: No results for input(s): "  VITAMINB12", "FOLATE", "FERRITIN", "TIBC", "IRON", "RETICCTPCT" in the last 72 hours.   Radiology: NM Myocar Multi W/Spect W/Wall Motion / EF Result Date: 07/21/2023   Findings are consistent with no ischemia. The study is high risk.   No ST deviation was noted.   LV perfusion is normal. There is no evidence of ischemia.   Left ventricular function is abnormal. Nuclear stress EF: 29%. The left ventricular ejection fraction is severely decreased (<30%). End diastolic cavity size is mildly enlarged.   US Venous Img Upper Uni Left Result Date: 07/20/2023 CLINICAL DATA:  Left upper extremity bruising EXAM: LEFT UPPER EXTREMITY VENOUS DOPPLER ULTRASOUND TECHNIQUE: Gray-scale sonography with graded compression, as well as color Doppler and duplex ultrasound were performed to evaluate the upper extremity deep venous system from the level of the subclavian vein and including the jugular, axillary, basilic, radial, ulnar and upper cephalic vein. Spectral Doppler was utilized to evaluate flow at rest and with distal augmentation maneuvers. COMPARISON:  None Available. FINDINGS: Internal Jugular Vein: No evidence of thrombus. Normal compressibility, respiratory phasicity and response to augmentation. Subclavian Vein: No evidence of thrombus. Normal compressibility, respiratory phasicity and response to augmentation. Axillary Vein: No evidence of thrombus. Normal compressibility,  respiratory phasicity and response to augmentation. Cephalic Vein: No evidence of thrombus. Normal compressibility, respiratory phasicity and response to augmentation. Basilic Vein: No evidence of thrombus. Normal compressibility, respiratory phasicity and response to augmentation. Brachial Veins: No evidence of thrombus. Normal compressibility, respiratory phasicity and response to augmentation. Radial Veins: No evidence of thrombus. Normal compressibility, respiratory phasicity and response to augmentation. Ulnar Veins: No evidence of thrombus. Normal compressibility, respiratory phasicity and response to augmentation. Venous Reflux:  None visualized. Other Findings: A 3.4 x 1.2 x 5.1 cm heterogeneous hypoechoic collection is noted in the deep left shoulder soft tissues without internal vascularity, presumably related to the large left rotator cuff tear described on recent MRI. IMPRESSION: No evidence of DVT within the left upper extremity. Electronically Signed   By: Delbert Phenix M.D.   On: 07/20/2023 13:35   DG Chest 2 View Result Date: 07/20/2023 CLINICAL DATA:  sob EXAM: CHEST - 2 VIEW COMPARISON:  01/24/2023 FINDINGS: Lungs are clear. Heart size and mediastinal contours are within normal limits. Aortic Atherosclerosis (ICD10-170.0). CABG markers. No effusion. Sternotomy wires.  Surgical clips left upper abdomen. IMPRESSION: No acute cardiopulmonary disease. Electronically Signed   By: Corlis Leak M.D.   On: 07/20/2023 12:48   MR SHOULDER LEFT WO CONTRAST Result Date: 07/07/2023 CLINICAL DATA:  Rotator cuff arthropathy of left shoulder. EXAM: MRI OF THE LEFT SHOULDER WITHOUT CONTRAST TECHNIQUE: Multiplanar, multisequence MR imaging of the shoulder was performed. No intravenous contrast was administered. COMPARISON:  None Available. FINDINGS: Rotator cuff: The humeral head is high-riding. There is a massive full-thickness tear of the entire supraspinatus tendon in the anterior approximately 50% of the  infraspinatus tendon footprints with tendon retraction up to 3.8 cm, medial to the humeral head apex. Moderate intermediate T2 signal tendinosis of the more posterior infraspinatus tendon footprint. There is a full-thickness tear of the majority of the subscapularis tendon insertion. There may be some intact far superior subscapularis tendon fibers. The teres minor is intact. Muscles: Moderate to high-grade anterior supraspinatus and mild superior subscapularis muscle atrophy. Biceps long head: The long head of the biceps tendon is not well visualized proximal to the bicipital groove. The tendon appears to be highly attenuated within the proximal aspect of the bicipital groove (axial images tendon 11). Likely interstitial  tearing within the biceps tendon within the more distal aspect of the bicipital groove (axial images 19 through 25). Findings are suspicious for proximal tendon rupture with distal tendon retraction. Acromioclavicular Joint: There are moderate degenerative changes of the acromioclavicular joint including joint space narrowing, subchondral marrow edema, and peripheral osteophytosis. 5 mm ossicle at the superior aspect of the joint. Type III acromion, with mild downsloping of the anterolateral acromion. Glenohumeral Joint: There is full-thickness cartilage loss within the superomedial aspect of the humeral head. High-grade thinning of the superior glenoid cartilage. Mild-to-moderate joint effusion. Labrum: Moderate attenuation throughout the superior glenoid labrum. Peripheral degenerative irregularity of the posterosuperior glenoid labrum. Bones: There is bilateral chronic cystic change within the lesser tuberosity adjacent to the biceps tendon within the bicipital groove. Other: None. IMPRESSION: 1. Massive full-thickness tear of the entire supraspinatus tendon and the anterior approximately 50% of the infraspinatus tendon footprints with tendon retraction up to 3.8 cm, medial to the humeral head  apex. Moderate to high-grade anterior supraspinatus and mild superior subscapularis muscle atrophy. 2. Findings suspicious for proximal long head of the biceps tendon rupture with distal tendon retraction. Interstitial tearing of the biceps tendon within the bicipital groove. 3. Moderate degenerative changes of the acromioclavicular joint. 4. Moderate glenohumeral cartilage degenerative changes. Electronically Signed   By: Neita Garnet M.D.   On: 07/07/2023 15:47    ECHO pending  TELEMETRY reviewed by me 07/21/2023: Sinus rhythm with PVCs rate 80s  EKG reviewed by me: This rhythm rate 91 bpm, nonacute  Data reviewed by me 07/21/2023: last 24h vitals tele labs imaging I/O ED provider note, admission H&P  Principal Problem:   NSTEMI (non-ST elevated myocardial infarction) (HCC) Active Problems:   Type 2 diabetes mellitus with hyperlipidemia (HCC)   History of renal cell carcinoma s/p nephrectomy   Hypothyroidism   Elevated troponin   Leukocytosis   HLD (hyperlipidemia)   GERD (gastroesophageal reflux disease)   Anxiety   CAD (coronary artery disease)   Hx of CABG   Weight loss    ASSESSMENT AND PLAN:  KEETON GARGES is a 88 y.o. male  with a past medical history of coronary artery disease s/p CABG as well as stenting 1990s, hypertension, hyperlipidemia, type 2 diabetes, MGUS, CLL, left renal cancer s/p left nephrectomy, GERD, hypothyroidism, chronic kidney disease stage IIIa who presented to the ED on 07/20/2023 for weakness. Cardiology was consulted for further evaluation.   # Weakness # Elevated troponin Patient with weakness for a few weeks which has reportedly been getting worse.  Denies any chest pain or other exertional symptoms.  Troponins mildly elevated and flat trending at 93 > 89.  EKG nonischemic. -Mildly elevated and flat troponin most consistent with demand/supply mismatch and not ACS in the absence of chest pain -Echo pending -Nuclear stress test today for  additional evaluation  # Coronary artery disease s/p CABG # Hypertension Patient with remote history of bypass surgery in the 1990s.  Denies any recent episodes of chest pain or other anginal symptoms. -Continue home Crestor 40 mg daily, aspirin 81 mg daily. -Continue carvedilol 6.25 mg twice daily.  ADDENDUM: Stress test reviewed by Dr. Corky Sing, did not show any evidence of ischemia.  Echo with EF around 30%, relatively stable.  He remains without chest pain, shortness of breath.  Stable for discharge home from a cardiac perspective, no further recommendations.  This patient's plan of care was discussed and created with Dr. Corky Sing and he is in agreement.  Signed: Gale Journey,  PA-C  07/21/2023, 5:15 PM [facility] Cardiology

## 2023-07-21 NOTE — Progress Notes (Signed)
*  PRELIMINARY RESULTS* Echocardiogram 2D Echocardiogram has been performed.  Carolyne Fiscal 07/21/2023, 2:11 PM

## 2023-07-21 NOTE — ED Notes (Signed)
Patient provided with dinner tray.

## 2023-07-21 NOTE — ED Notes (Signed)
Pt remains out of the department.

## 2023-07-21 NOTE — ED Notes (Signed)
Pt called out and wanted to stand up and use the urinal.This tech let the rail down on bed and helped pt up to use the urinal. Pt back in bed at this time.

## 2023-07-21 NOTE — Progress Notes (Signed)
Progress Note   Patient: Daniel Bray:[id number] DOB: 12-29-1934 DOA: 07/20/2023     1 DOS: the patient was seen and examined on 07/21/2023   Brief hospital course: [name] is a 88 year old male with history of CAD status post PCI in 199s and CABG in 1990s, hypertension, hyperlipidemia, non-insulin-dependent diabetes mellitus, MGUS, CLL, history of left renal cell cancer status post left nephrectomy, history of status post partial colectomy, GERD, hypothyroid, anxiety, CKD 3A, history of pancreatic cancer status post resection, history of small bowel obstruction, who presents emergency department for chief concerns of worsening weakness over the past few weeks.  Vitals in the ED showed temperature of 97.7, respiration rate of 18, heart rate 95, blood pressure 135/77, SpO2 of 98% on room air.  Serum sodium is 134, potassium 4.4, chloride 99, bicarb 26, BUN of 29, serum creatinine 1.27, EGFR 55, nonfasting blood glucose 217, WBC 18.9, hemoglobin 13.9, platelets of 243.  Troponin 93  ED treatment: Aspirin 324 mg p.o. once, echocardiogram was ordered and cardiology was consulted.  12/16: Vitals with mildly elevated blood pressure at 154/98, troponin with a flat curve, 93>>89, pending echocardiogram and stress test.  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  He follow-up with [facility] and advised to follow-up with them as soon as possible to rule out any recurrence.  No chest pain.  Assessment and Plan: * NSTEMI (non-ST elevated myocardial infarction) (HCC) Elevated troponin with no chest pain.  Progressive weakness.  NSTEMI versus demand ischemia.  Cardiology is on board. Pending echocardiogram and stress test. -Continue to monitor  Weight loss Advised patient to continue outpatient follow-up with cancer doctor at [facility] Patient endorses understanding and compliance  Hx of  CABG And PCI in the 90s Coreg 6.25 mg p.o. twice daily resumed; nitroglycerin 0.4 mg sublingual every 5 minutes as needed for chest pain ordered Patient is status post aspirin 324 mg p.o. one-time dose per EDP  HLD (hyperlipidemia) Home rosuvastatin 40 mg nightly resumed  Leukocytosis Worsening leukocytosis, history of hairy cell leukemia. -Need to follow-up with his oncologist  Hypothyroidism Home levothyroxine 75 mcg daily resumed on admission  Type 2 diabetes mellitus with hyperlipidemia (HCC) Home oral antiglycemic agents will not be resumed on admission Insulin SSI with at bedtime coverage ordered Goal inpatient blood glucose levels 140-180   Subjective: Patient was complaining of worsening weakness, becoming full too quickly and losing weight.  No chest pain or shortness of breath.  Physical Exam: Vitals:   07/21/23 0638 07/21/23 0830 07/21/23 1000 07/21/23 1230  BP: (!) 154/98 (!) 156/100 (!) 144/81 134/85  Pulse: 85 91 83 79  Resp: 20 (!) 23 10 19   Temp:      TempSrc:      SpO2: 95% 95% 95% 97%  Weight:      Height:       General.  Frail elderly man, in no acute distress. Pulmonary.  Lungs clear bilaterally, normal respiratory effort. CV.  Regular rate and rhythm, no JVD, rub or murmur. Abdomen.  Soft, nontender, nondistended, BS positive. CNS.  Alert and oriented .  No focal neurologic deficit. Extremities.  No edema, no cyanosis, pulses intact and symmetrical. Psychiatry.  Judgment and insight appears normal.   Data Reviewed: Prior data reviewed  Family Communication: Discussed with wife and daughter at bedside  Disposition: Status is: Inpatient Remains inpatient appropriate because: Severity of illness  Planned Discharge Destination: Home  DVT prophylaxis.  Subcu heparin Time spent: 50 minutes  This record has been created using Conservation officer, historic buildings. Errors have been sought and corrected,but may not always be located. Such creation  errors do not reflect on the standard of care.   Author: Arnetha Courser, MD 07/21/2023 2:55 PM  For on call review [email or web address].

## 2023-07-22 DIAGNOSIS — R531 Weakness: Secondary | ICD-10-CM

## 2023-07-22 DIAGNOSIS — I214 Non-ST elevation (NSTEMI) myocardial infarction: Secondary | ICD-10-CM | POA: Diagnosis not present

## 2023-07-22 LAB — CBC
HCT: 44.3 % (ref 39.0–52.0)
Hemoglobin: 14.2 g/dL (ref 13.0–17.0)
MCH: 34.5 pg — ABNORMAL HIGH (ref 26.0–34.0)
MCHC: 32.1 g/dL (ref 30.0–36.0)
MCV: 107.8 fL — ABNORMAL HIGH (ref 80.0–100.0)
Platelets: 240 10*3/uL (ref 150–400)
RBC: 4.11 MIL/uL — ABNORMAL LOW (ref 4.22–5.81)
RDW: 15.1 % (ref 11.5–15.5)
WBC: 25.6 10*3/uL — ABNORMAL HIGH (ref 4.0–10.5)
nRBC: 0 % (ref 0.0–0.2)

## 2023-07-22 LAB — BASIC METABOLIC PANEL
Anion gap: 9 (ref 5–15)
BUN: 34 mg/dL — ABNORMAL HIGH (ref 8–23)
CO2: 26 mmol/L (ref 22–32)
Calcium: 10.4 mg/dL — ABNORMAL HIGH (ref 8.9–10.3)
Chloride: 97 mmol/L — ABNORMAL LOW (ref 98–111)
Creatinine, Ser: 1.44 mg/dL — ABNORMAL HIGH (ref 0.61–1.24)
GFR, Estimated: 47 mL/min — ABNORMAL LOW (ref >60)
Glucose, Bld: 136 mg/dL — ABNORMAL HIGH (ref 70–99)
Potassium: 5 mmol/L (ref 3.5–5.1)
Sodium: 132 mmol/L — ABNORMAL LOW (ref 135–145)

## 2023-07-22 LAB — CBG MONITORING, ED: Glucose-Capillary: 146 mg/dL — ABNORMAL HIGH (ref 70–99)

## 2023-07-22 MED ORDER — FUROSEMIDE 40 MG PO TABS: 20.0000 mg | ORAL_TABLET | Freq: Every day | ORAL | Status: DC

## 2023-07-22 MED ORDER — MELATONIN 5 MG PO TABS
10.0000 mg | ORAL_TABLET | Freq: Once | ORAL | Status: AC
  Administered 2023-07-22: 10 mg via ORAL
  Filled 2023-07-22: qty 2

## 2023-07-22 MED ORDER — ISOSORBIDE MONONITRATE ER 60 MG PO TB24: 60.0000 mg | ORAL_TABLET | Freq: Every day | ORAL | Status: DC

## 2023-07-22 MED ORDER — EMPAGLIFLOZIN 25 MG PO TABS
25.0000 mg | ORAL_TABLET | Freq: Every day | ORAL | Status: DC
  Filled 2023-07-22: qty 1

## 2023-07-22 NOTE — Discharge Summary (Addendum)
Physician Discharge Summary   Patient: Daniel Bray MRN: [id number] DOB: July 01, 1935  Admit date:     07/20/2023  Discharge date: 07/22/23  Discharge Physician: Lurene Shadow   PCP: [facility]   Recommendations at discharge:    Follow up with PCP in 1 week  Discharge Diagnoses: Principal Problem:   General weakness Active Problems:   Elevated troponin   CAD (coronary artery disease)   Type 2 diabetes mellitus with hyperlipidemia (HCC)   History of renal cell carcinoma s/p nephrectomy   Hypothyroidism   Leukocytosis   HLD (hyperlipidemia)   GERD (gastroesophageal reflux disease)   Anxiety   Hx of CABG   Weight loss  Resolved Problems:   * No resolved hospital problems. [facility] Course:  [name] is an 88 year old man with history significant for CAD s/p remote PCI and CABG, hypertension, hyperlipidemia, chronic systolic CHF/cardiomyopathy being evaluated for possible amyloidosis, non-insulin-dependent diabetes mellitus, MGUS, CLL, hairy cell leukemia, history of left renal cell cancer s/p left nephrectomy history of partial colectomy, CAD, hypothyroidism, anxiety, CKD stage IIIa, history of pancreatic cancer s/p resection, history of small bowel obstruction.  He presented to the hospital because of general weakness.    Troponins were mildly elevated but flat.  He did not have any chest pain.  NSTEMI was ruled out.  Cardiologist was consulted to assist with management given underlying cardiac disease.  He underwent pharmacologic nuclear stress test.  Stress test did not show any evidence of reversible ischemia.  EF was 29%.  2D echo showed EF of 30%, moderate LVH, grade 1 diastolic dysfunction consistent with chronic systolic and diastolic CHF.  Of note, EF on 2D echo in June 2023 showed EF estimated at 35 to 40%.   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  No evidence of infection.  Outpatient follow-up with [place] oncologist was  recommended.  He had mild hyponatremia but he was asymptomatic from this. He said he felt better and did not have any symptoms and was ready to go home.  He was cleared for discharge by cardiologist.  He is deemed stable for discharge to home today.  Discharge plan was discussed with patient and his wife at the bedside.       Consultants: Cardiologist Procedures performed: Nuclear stress test Disposition: Home Diet recommendation:  Discharge Diet Orders (From admission, onward)     Start     Ordered   07/22/23 0000  Diet - low sodium heart healthy        07/22/23 0907           Cardiac and Carb modified diet DISCHARGE MEDICATION: Allergies as of 07/22/2023       Reactions   Levofloxacin Hives, Other (See Comments)   Levaquin [levofloxacin In D5w]    Weakness, low blood pressure   Januvia [sitagliptin] Rash        Medication List     STOP taking these medications    cholecalciferol 1000 units tablet Commonly known as: VITAMIN D   nitroGLYCERIN 0.4 MG SL tablet Commonly known as: NITROSTAT   pyridoxine 100 MG tablet Commonly known as: B-6       TAKE these medications    acetaminophen 650 MG CR tablet Commonly known as: TYLENOL Take 1,300 mg by mouth every 8 (eight) hours as needed for pain.   AeroChamber MV inhaler Use as instructed   albuterol 108 (90 Base) MCG/ACT inhaler Commonly known as: VENTOLIN HFA Inhale 2  puffs into the lungs every 2 (two) hours as needed for wheezing or shortness of breath.   Alka-Seltzer Heartburn + Gas 750-80 MG Chew Generic drug: Calcium Carbonate-Simethicone Chew 2 each by mouth daily as needed (gas/heartburn).   amLODipine 2.5 MG tablet Commonly known as: NORVASC Take 2.5 mg by mouth daily.   aspirin EC 81 MG tablet Take 81 mg by mouth daily.   carvedilol 6.25 MG tablet Commonly known as: COREG Take 1 tablet (6.25 mg total) by mouth 2 (two) times daily.   clobetasol cream 0.05 % Commonly known as:  TEMOVATE Apply 1 Application topically 2 (two) times daily as needed.   diclofenac Sodium 1 % Gel Commonly known as: VOLTAREN Apply 1 application topically daily as needed (pain).   Dulaglutide 3 MG/0.5ML Soaj Inject 3 mg into the skin every Monday.   empagliflozin 25 MG Tabs tablet Commonly known as: JARDIANCE Take 25 mg by mouth daily.   FIBER CHOICE PO Take 5 tablets by mouth with breakfast, with lunch, and with evening meal.   furosemide 20 MG tablet Commonly known as: LASIX Take 1 tablet (20 mg total) by mouth daily. Do not take if your systolic BP is less than 110   gabapentin 300 MG capsule Commonly known as: NEURONTIN Take 300 mg by mouth at bedtime.   glipiZIDE 10 MG tablet Commonly known as: GLUCOTROL Take 10 mg by mouth 2 (two) times daily with a meal.   hydrALAZINE 25 MG tablet Commonly known as: APRESOLINE Take 25 mg by mouth 2 (two) times daily.   ipratropium 0.06 % nasal spray Commonly known as: ATROVENT Place 2 sprays into both nostrils 4 (four) times daily.   isosorbide mononitrate 60 MG 24 hr tablet Commonly known as: IMDUR Take 60 mg by mouth daily.   levocetirizine 5 MG tablet Commonly known as: XYZAL Take 5 mg by mouth every evening.   levothyroxine 75 MCG tablet Commonly known as: SYNTHROID Take 75 mcg by mouth daily before breakfast.   Melatonin 10 MG Tabs Take 10 mg by mouth at bedtime as needed (sleep).   metFORMIN 500 MG 24 hr tablet Commonly known as: GLUCOPHAGE-XR Take 1 tablet by mouth 2 (two) times daily.   omeprazole 40 MG capsule Commonly known as: PRILOSEC Take 40 mg by mouth daily.   oxymetazoline 0.05 % nasal spray Commonly known as: AFRIN Place 1 spray into both nostrils at bedtime.   PRESERVISION AREDS 2+MULTI VIT PO Take 1 capsule by mouth in the morning and at bedtime.   rosuvastatin 40 MG tablet Commonly known as: CRESTOR Take 40 mg by mouth daily.   Senna Plus 8.6-50 MG tablet Generic drug:  senna-docusate Take 2 tablets by mouth 2 (two) times daily.   Vyndamax 61 MG Caps Generic drug: Tafamidis Take by mouth.        Follow-up Information     Paraschos, Alexander, MD. Go in 1 week(s).   Specialty: Cardiology Contact information: [address] [facility] [place] [address] [phone]                Discharge Exam: Ceasar Mons Weights   07/20/23 1223  Weight: 61.2 kg   Patient was seen and examined in the emergency department, room 9.  His wife was at the bedside.  GEN: NAD SKIN: Warm and dry EYES: No pallor or icterus ENT: MMM CV: RRR PULM: CTA B ABD: soft, ND, NT, +BS CNS: AAO x 3, non focal EXT: No edema or tenderness  Condition at discharge: good  The results of significant diagnostics from this hospitalization (including imaging, microbiology, ancillary and laboratory) are listed below for reference.   Imaging Studies: ECHOCARDIOGRAM COMPLETE Result Date: 07/21/2023    ECHOCARDIOGRAM REPORT   Patient Name:   LORELL SMERIGLIO Date of Exam: 07/21/2023 Medical Rec #:  [id number]          Height:       70.0 in Accession #:    [id number]         Weight:       135.0 lb Date of Birth:  07/11/1935         BSA:          1.766 m Patient Age:    88 years           BP:           144/81 mmHg Patient Gender: M                  HR:           86 bpm. Exam Location:  [facility] Procedure: 2D Echo, 3D Echo, Cardiac Doppler and Color Doppler Indications:     NSTEMI  History:         Patient has prior history of Echocardiogram examinations, most                  recent 01/18/2022. Acute MI and CAD, Prior CABG,                  Arrythmias:Bradycardia, Signs/Symptoms:Shortness of Breath and                  Syncope; Risk Factors:Diabetes, Hypertension, Dyslipidemia and                  Current Smoker. CKD.  Sonographer:     Mikki Harbor Referring Phys:  [id number] AMY N COX Diagnosing Phys: Windell Norfolk  Sonographer Comments: Image acquisition  challenging due to patient body habitus. IMPRESSIONS  1. Findings suggestive of infiltrative cardiomyopathy. Left ventricular ejection fraction, by estimation, is 30%. The left ventricle has severely decreased function. The left ventricle demonstrates global hypokinesis. There is moderate left ventricular hypertrophy. Left ventricular diastolic parameters are consistent with Grade I diastolic dysfunction (impaired relaxation).  2. Right ventricular systolic function is normal. The right ventricular size is normal.  3. The mitral valve is normal in structure. Trivial mitral valve regurgitation.  4. The aortic valve is tricuspid. Aortic valve regurgitation is not visualized. FINDINGS  Left Ventricle: Findings suggestive of infiltrative cardiomyopathy. Left ventricular ejection fraction, by estimation, is 30%. The left ventricle has severely decreased function. The left ventricle demonstrates global hypokinesis. The left ventricular internal cavity size was normal in size. There is moderate left ventricular hypertrophy. Left ventricular diastolic parameters are consistent with Grade I diastolic dysfunction (impaired relaxation). Right Ventricle: The right ventricular size is normal. No increase in right ventricular wall thickness. Right ventricular systolic function is normal. Left Atrium: Left atrial size was normal in size. Right Atrium: Right atrial size was normal in size. Pericardium: There is no evidence of pericardial effusion. Mitral Valve: The mitral valve is normal in structure. Trivial mitral valve regurgitation. MV peak gradient, 3.0 mmHg. The mean mitral valve gradient is 1.0 mmHg. Tricuspid Valve: The tricuspid valve is normal in structure. Tricuspid valve regurgitation is trivial. Aortic Valve: The aortic valve is tricuspid. Aortic valve regurgitation is not visualized. Aortic valve mean gradient measures 2.0 mmHg. Aortic valve  peak gradient measures 4.5 mmHg. Aortic valve area, by VTI measures 2.61 cm.  Pulmonic Valve: The pulmonic valve was not well visualized. Pulmonic valve regurgitation is trivial. Aorta: The aortic root is normal in size and structure. Venous: The inferior vena cava was not well visualized. IAS/Shunts: The interatrial septum was not assessed.  LEFT VENTRICLE PLAX 2D LVIDd:         4.50 cm     Diastology LVIDs:         3.80 cm     LV e' medial:    4.57 cm/s LV PW:         1.30 cm     LV E/e' medial:  7.8 LV IVS:        1.30 cm     LV e' lateral:   5.77 cm/s LVOT diam:     2.10 cm     LV E/e' lateral: 6.2 LV SV:         45 LV SV Index:   26 LVOT Area:     3.46 cm  LV Volumes (MOD) LV vol d, MOD A2C: 58.9 ml LV vol d, MOD A4C: 98.4 ml LV vol s, MOD A2C: 35.7 ml LV vol s, MOD A4C: 74.5 ml LV SV MOD A2C:     23.2 ml LV SV MOD A4C:     98.4 ml LV SV MOD BP:      27.9 ml RIGHT VENTRICLE RV Basal diam:  3.05 cm RV Mid diam:    2.20 cm RV S prime:     8.27 cm/s LEFT ATRIUM             Index        RIGHT ATRIUM           Index LA diam:        3.40 cm 1.93 cm/m   RA Area:     10.40 cm LA Vol (A2C):   40.0 ml 22.65 ml/m  RA Volume:   22.90 ml  12.97 ml/m LA Vol (A4C):   31.2 ml 17.66 ml/m LA Biplane Vol: 38.3 ml 21.68 ml/m  AORTIC VALVE                    PULMONIC VALVE AV Area (Vmax):    2.54 cm     PV Vmax:       1.00 m/s AV Area (Vmean):   2.58 cm     PV Peak grad:  4.0 mmHg AV Area (VTI):     2.61 cm AV Vmax:           106.00 cm/s AV Vmean:          62.200 cm/s AV VTI:            0.174 m AV Peak Grad:      4.5 mmHg AV Mean Grad:      2.0 mmHg LVOT Vmax:         77.70 cm/s LVOT Vmean:        46.300 cm/s LVOT VTI:          0.131 m LVOT/AV VTI ratio: 0.75  AORTA Ao Root diam: 3.80 cm Ao Asc diam:  3.40 cm MITRAL VALVE MV Area (PHT): 8.92 cm    SHUNTS MV Area VTI:   2.30 cm    Systemic VTI:  0.13 m MV Peak grad:  3.0 mmHg    Systemic Diam: 2.10 cm MV Mean grad:  1.0 mmHg MV Vmax:  0.86 m/s MV Vmean:      51.1 cm/s MV Decel Time: 85 msec MV E velocity: 35.80 cm/s MV A velocity: 83.00 cm/s  MV E/A ratio:  0.43 Windell Norfolk Electronically signed by Windell Norfolk Signature Date/Time: 07/21/2023/5:50:06 PM    Final    NM Myocar Multi W/Spect Izetta Dakin Motion / EF Result Date: 07/21/2023   Findings are consistent with no ischemia. The study is high risk.   No ST deviation was noted.   LV perfusion is normal. There is no evidence of ischemia.   Left ventricular function is abnormal. Nuclear stress EF: 29%. The left ventricular ejection fraction is severely decreased (<30%). End diastolic cavity size is mildly enlarged.   US Venous Img Upper Uni Left Result Date: 07/20/2023 CLINICAL DATA:  Left upper extremity bruising EXAM: LEFT UPPER EXTREMITY VENOUS DOPPLER ULTRASOUND TECHNIQUE: Gray-scale sonography with graded compression, as well as color Doppler and duplex ultrasound were performed to evaluate the upper extremity deep venous system from the level of the subclavian vein and including the jugular, axillary, basilic, radial, ulnar and upper cephalic vein. Spectral Doppler was utilized to evaluate flow at rest and with distal augmentation maneuvers. COMPARISON:  None Available. FINDINGS: Internal Jugular Vein: No evidence of thrombus. Normal compressibility, respiratory phasicity and response to augmentation. Subclavian Vein: No evidence of thrombus. Normal compressibility, respiratory phasicity and response to augmentation. Axillary Vein: No evidence of thrombus. Normal compressibility, respiratory phasicity and response to augmentation. Cephalic Vein: No evidence of thrombus. Normal compressibility, respiratory phasicity and response to augmentation. Basilic Vein: No evidence of thrombus. Normal compressibility, respiratory phasicity and response to augmentation. Brachial Veins: No evidence of thrombus. Normal compressibility, respiratory phasicity and response to augmentation. Radial Veins: No evidence of thrombus. Normal compressibility, respiratory phasicity and response to augmentation.  Ulnar Veins: No evidence of thrombus. Normal compressibility, respiratory phasicity and response to augmentation. Venous Reflux:  None visualized. Other Findings: A 3.4 x 1.2 x 5.1 cm heterogeneous hypoechoic collection is noted in the deep left shoulder soft tissues without internal vascularity, presumably related to the large left rotator cuff tear described on recent MRI. IMPRESSION: No evidence of DVT within the left upper extremity. Electronically Signed   By: Delbert Phenix M.D.   On: 07/20/2023 13:35   DG Chest 2 View Result Date: 07/20/2023 CLINICAL DATA:  sob EXAM: CHEST - 2 VIEW COMPARISON:  01/24/2023 FINDINGS: Lungs are clear. Heart size and mediastinal contours are within normal limits. Aortic Atherosclerosis (ICD10-170.0). CABG markers. No effusion. Sternotomy wires.  Surgical clips left upper abdomen. IMPRESSION: No acute cardiopulmonary disease. Electronically Signed   By: Corlis Leak M.D.   On: 07/20/2023 12:48   MR SHOULDER LEFT WO CONTRAST Result Date: 07/07/2023 CLINICAL DATA:  Rotator cuff arthropathy of left shoulder. EXAM: MRI OF THE LEFT SHOULDER WITHOUT CONTRAST TECHNIQUE: Multiplanar, multisequence MR imaging of the shoulder was performed. No intravenous contrast was administered. COMPARISON:  None Available. FINDINGS: Rotator cuff: The humeral head is high-riding. There is a massive full-thickness tear of the entire supraspinatus tendon in the anterior approximately 50% of the infraspinatus tendon footprints with tendon retraction up to 3.8 cm, medial to the humeral head apex. Moderate intermediate T2 signal tendinosis of the more posterior infraspinatus tendon footprint. There is a full-thickness tear of the majority of the subscapularis tendon insertion. There may be some intact far superior subscapularis tendon fibers. The teres minor is intact. Muscles: Moderate to high-grade anterior supraspinatus and mild superior subscapularis muscle atrophy. Biceps long head: The long  head of  the biceps tendon is not well visualized proximal to the bicipital groove. The tendon appears to be highly attenuated within the proximal aspect of the bicipital groove (axial images tendon 11). Likely interstitial tearing within the biceps tendon within the more distal aspect of the bicipital groove (axial images 19 through 25). Findings are suspicious for proximal tendon rupture with distal tendon retraction. Acromioclavicular Joint: There are moderate degenerative changes of the acromioclavicular joint including joint space narrowing, subchondral marrow edema, and peripheral osteophytosis. 5 mm ossicle at the superior aspect of the joint. Type III acromion, with mild downsloping of the anterolateral acromion. Glenohumeral Joint: There is full-thickness cartilage loss within the superomedial aspect of the humeral head. High-grade thinning of the superior glenoid cartilage. Mild-to-moderate joint effusion. Labrum: Moderate attenuation throughout the superior glenoid labrum. Peripheral degenerative irregularity of the posterosuperior glenoid labrum. Bones: There is bilateral chronic cystic change within the lesser tuberosity adjacent to the biceps tendon within the bicipital groove. Other: None. IMPRESSION: 1. Massive full-thickness tear of the entire supraspinatus tendon and the anterior approximately 50% of the infraspinatus tendon footprints with tendon retraction up to 3.8 cm, medial to the humeral head apex. Moderate to high-grade anterior supraspinatus and mild superior subscapularis muscle atrophy. 2. Findings suspicious for proximal long head of the biceps tendon rupture with distal tendon retraction. Interstitial tearing of the biceps tendon within the bicipital groove. 3. Moderate degenerative changes of the acromioclavicular joint. 4. Moderate glenohumeral cartilage degenerative changes. Electronically Signed   By: Neita Garnet M.D.   On: 07/07/2023 15:47    Microbiology: Results for orders placed or  performed during the hospital encounter of 11/23/21  Resp Panel by RT-PCR (Flu A&B, Covid) Nasopharyngeal Swab     Status: None   Collection Time: 11/23/21  8:54 PM   Specimen: Nasopharyngeal Swab; Nasopharyngeal(NP) swabs in vial transport medium  Result Value Ref Range Status   SARS Coronavirus 2 by RT PCR NEGATIVE NEGATIVE Final    Comment: (NOTE) SARS-CoV-2 target nucleic acids are NOT DETECTED.  The SARS-CoV-2 RNA is generally detectable in upper respiratory specimens during the acute phase of infection. The lowest concentration of SARS-CoV-2 viral copies this assay can detect is 138 copies/mL. A negative result does not preclude SARS-Cov-2 infection and should not be used as the sole basis for treatment or other patient management decisions. A negative result may occur with  improper specimen collection/handling, submission of specimen other than nasopharyngeal swab, presence of viral mutation(s) within the areas targeted by this assay, and inadequate number of viral copies(<138 copies/mL). A negative result must be combined with clinical observations, patient history, and epidemiological information. The expected result is Negative.  Fact Sheet for Patients:  BloggerCourse.com  Fact Sheet for Healthcare Providers:  SeriousBroker.it  This test is no t yet approved or cleared by the [place] FDA and  has been authorized for detection and/or diagnosis of SARS-CoV-2 by FDA under an Emergency Use Authorization (EUA). This EUA will remain  in effect (meaning this test can be used) for the duration of the COVID-19 declaration under Section 564(b)(1) of the Act, 21 U.S.C.section 360bbb-3(b)(1), unless the authorization is terminated  or revoked sooner.       Influenza A by PCR NEGATIVE NEGATIVE Final   Influenza B by PCR NEGATIVE NEGATIVE Final    Comment: (NOTE) The Xpert Xpress SARS-CoV-2/FLU/RSV plus assay is intended as  an aid in the diagnosis of influenza from Nasopharyngeal swab specimens and should not be used as a  sole basis for treatment. Nasal washings and aspirates are unacceptable for Xpert Xpress SARS-CoV-2/FLU/RSV testing.  Fact Sheet for Patients: BloggerCourse.com  Fact Sheet for Healthcare Providers: SeriousBroker.it  This test is not yet approved or cleared by the [place] FDA and has been authorized for detection and/or diagnosis of SARS-CoV-2 by FDA under an Emergency Use Authorization (EUA). This EUA will remain in effect (meaning this test can be used) for the duration of the COVID-19 declaration under Section 564(b)(1) of the Act, 21 U.S.C. section 360bbb-3(b)(1), unless the authorization is terminated or revoked.  Performed at [facility], [address] [place], [place] [address]     Labs: CBC: Recent Labs  Lab 07/20/23 1225 07/21/23 0432 07/22/23 0644  WBC 18.9* 22.5* 25.6*  HGB 13.9 14.1 14.2  HCT 43.3 43.3 44.3  MCV 108.8* 106.7* 107.8*  PLT 243 251 240   Basic Metabolic Panel: Recent Labs  Lab 07/20/23 1225 07/21/23 0432 07/22/23 0644  NA 134* 133* 132*  K 4.4 4.8 5.0  CL 99 102 97*  CO2 26 24 26   GLUCOSE 217* 143* 136*  BUN 29* 29* 34*  CREATININE 1.27* 1.07 1.44*  CALCIUM 11.4* 11.1* 10.4*   Liver Function Tests: Recent Labs  Lab 07/20/23 1225  AST 43*  ALT 46*  ALKPHOS 65  BILITOT 0.9  PROT 6.5  ALBUMIN 3.8   CBG: Recent Labs  Lab 07/21/23 0724 07/21/23 1125 07/21/23 1628 07/21/23 2116 07/22/23 0833  GLUCAP 133* 118* 142* 235* 146*    Discharge time spent: greater than 30 minutes.  Signed: Lurene Shadow, MD [facility] 07/22/2023

## 2023-07-22 NOTE — ED Notes (Signed)
Patient reports difficulty falling asleep.  Requesting medication to help. Notified NP

## 2023-09-24 ENCOUNTER — Ambulatory Visit
Admission: EM | Admit: 2023-09-24 | Discharge: 2023-09-24 | Disposition: A | Discharge status: Home / Self Care | Payer: Medicare HMO | Attending: Family Medicine | Admitting: Family Medicine

## 2023-09-24 DIAGNOSIS — R21 Rash and other nonspecific skin eruption: Secondary | ICD-10-CM

## 2023-09-24 MED ORDER — TRIAMCINOLONE ACETONIDE 0.1 % EX OINT: 1.0000 | TOPICAL_OINTMENT | Freq: Two times a day (BID) | CUTANEOUS | 0 refills | Status: AC

## 2023-09-24 MED ORDER — PREDNISONE 10 MG (21) PO TBPK: ORAL_TABLET | Freq: Every day | ORAL | 0 refills | Status: DC

## 2023-09-24 NOTE — Discharge Instructions (Signed)
 Stop by the pharmacy to pick up your prescriptions.  Follow up with your primary care provider or return to the urgent care, if not improving.

## 2023-09-24 NOTE — ED Provider Notes (Signed)
 [facility]    CSN: [id number] Arrival date & time: 09/24/23  0945      History   Chief Complaint Chief Complaint  Patient presents with   Rash    HPI Daniel Bray is a 89 y.o. male.   HPI  Daniel Bray presents for rash on the sides of his face and bilateral forearms for the past 2 days.  He is a Therapist, occupational and they practice outside.  Denies anything biting him.  No new detergents, soaps or lotions.  His wife who sleeps beside him does not have any problems itching.  He has been applying Sarna which has helped the itching.       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   Diabetic polyneuropathy associated with type 2 diabetes mellitus (HCC)    Ileus (HCC) 05/29/2020   Acute renal failure superimposed on stage 3a chronic kidney disease (HCC) 05/28/2020   Nausea vomiting and diarrhea 05/28/2020   Abdominal pain 05/28/2020   Hyperkalemia 05/28/2020   Hypercalcemia 05/28/2020   Elevated troponin 01/24/2020   Leukocytosis 01/24/2020   HLD (hyperlipidemia) 01/24/2020   GERD (gastroesophageal reflux disease) 01/24/2020   Anxiety 01/24/2020   CAD (coronary artery disease) 01/24/2020   Tobacco abuse 01/24/2020   Stage 3b chronic kidney disease (HCC) 01/24/2020   Bradycardia 01/24/2020   Laceration of forehead    Syncope 10/12/2019   Type 2 diabetes mellitus with hyperlipidemia (HCC) 10/12/2019   Accelerated hypertension  10/12/2019   History of renal cell carcinoma s/p nephrectomy 10/12/2019   SBO (small bowel obstruction) (HCC) 10/12/2019   Hypothyroidism 10/12/2019    Past Surgical History:  Procedure Laterality Date   CARDIAC ELECTROPHYSIOLOGY STUDY AND ABLATION     CARDIAC SURGERY     CORONARY ANGIOPLASTY WITH STENT PLACEMENT     CORONARY ARTERY BYPASS GRAFT     x3   LEFT HEART CATH AND CORONARY ANGIOGRAPHY N/A 08/03/2021   Procedure: LEFT HEART CATH AND CORONARY ANGIOGRAPHY;  Surgeon: Armando Reichert, MD;  Location: [facility] INVASIVE CV LAB;  Service: Cardiovascular;  Laterality: N/A;   PANCREAS SURGERY     RIGHT HEART CATH N/A 01/18/2022   Procedure: RIGHT HEART CATH;  Surgeon: Alwyn Pea, MD;  Location: [facility] INVASIVE CV LAB;  Service: Cardiovascular;  Laterality: N/A;   SPLENECTOMY         Home Medications    Prior to Admission medications   Medication Sig Start Date End Date Taking? Authorizing Provider  predniSONE (STERAPRED UNI-PAK 21 TAB) 10 MG (21) TBPK tablet Take by mouth daily. Take 6 tabs by mouth daily for 1, then 5 tabs for 1 day, then 4 tabs for 1 day, then 3 tabs for 1 day, then 2 tabs for 1 day, then 1 tab for 1 day. 09/24/23  Yes Katha Cabal, DO  triamcinolone ointment (KENALOG) 0.1 % Apply 1 Application topically 2 (two) times daily. 09/24/23  Yes Demondre Aguas, Seward Meth, DO  acetaminophen (TYLENOL) 650 MG CR tablet Take 1,300 mg by mouth every 8 (eight) hours as needed for pain.    [provider]  albuterol (VENTOLIN HFA) 108 (90 Base) MCG/ACT inhaler Inhale 2 puffs into the lungs every 2 (two) hours as needed for wheezing or shortness of breath. 11/24/21   Enedina Finner, MD  amLODipine (NORVASC) 2.5 MG tablet Take 2.5 mg by mouth daily.    [provider]  aspirin EC 81 MG tablet Take 81 mg by mouth daily.    [provider]  Calcium Carbonate-Simethicone (ALKA-SELTZER HEARTBURN + GAS) 750-80 MG CHEW Chew 2 each by mouth daily as needed  (gas/heartburn).    [provider]  carvedilol (COREG) 6.25 MG tablet Take 1 tablet (6.25 mg total) by mouth 2 (two) times daily. 01/18/22 07/20/23  Tresa Moore, MD  clobetasol cream (TEMOVATE) 0.05 % Apply 1 Application topically 2 (two) times daily as needed. 03/21/23 03/20/24  [provider]  diclofenac Sodium (VOLTAREN) 1 % GEL Apply 1 application topically daily as needed (pain).    [provider]  Dulaglutide 3 MG/0.5ML SOPN Inject 3 mg into the skin every Monday.    [provider]  empagliflozin (JARDIANCE) 25 MG TABS tablet Take 25 mg by mouth daily.    [provider]  furosemide (LASIX) 20 MG tablet Take 1 tablet (20 mg total) by mouth daily. Do not take if your systolic BP is less than 110 01/19/22 07/20/23  Sreenath, Sudheer B, MD  gabapentin (NEURONTIN) 300 MG capsule Take 300 mg by mouth at bedtime.    [provider]  glipiZIDE (GLUCOTROL) 10 MG tablet Take 10 mg by mouth 2 (two) times daily with a meal.     [provider]  hydrALAZINE (APRESOLINE) 25 MG tablet Take 25 mg by mouth 2 (two) times daily.    [provider]  Inulin (FIBER CHOICE PO) Take 5 tablets by mouth with breakfast, with lunch, and with evening meal.    [provider]  ipratropium (ATROVENT) 0.06 % nasal spray Place 2 sprays into both nostrils 4 (four) times daily. 01/24/23   Domenick Gong, MD  isosorbide mononitrate (IMDUR) 60 MG 24 hr tablet Take 60 mg by mouth daily.    [provider]  levocetirizine (XYZAL) 5 MG tablet Take 5 mg by mouth every evening. 11/27/20   [provider]  levothyroxine (SYNTHROID) 75 MCG tablet Take 75 mcg by mouth daily before breakfast.     [provider]  Melatonin 10 MG TABS Take 10 mg by mouth at bedtime as needed (sleep).    [provider]  metFORMIN (GLUCOPHAGE-XR) 500 MG 24 hr tablet Take 1 tablet by mouth 2 (two) times daily. 05/12/23 05/11/24   [provider]  Multiple Vitamins-Minerals (PRESERVISION AREDS 2+MULTI VIT PO) Take 1 capsule by mouth in the morning and at bedtime.    [provider]  omeprazole (PRILOSEC) 40 MG capsule Take 40 mg by mouth daily.     [provider]  oxymetazoline (AFRIN) 0.05 % nasal spray Place 1 spray into both nostrils at bedtime.    [provider]  rosuvastatin (CRESTOR) 40 MG tablet Take 40 mg by mouth daily.    [provider]  SENNA PLUS 8.6-50 MG tablet Take 2 tablets by mouth 2 (two) times daily.    [provider]  Spacer/Aero-Holding Deretha Emory (AEROCHAMBER MV) inhaler Use as instructed 01/24/23   Domenick Gong, MD  [facility] 61 MG CAPS Take by mouth. 07/19/22   [provider]    Family History Family History  Problem Relation Age of Onset   Pancreatic cancer Mother    Liver cancer Mother    Heart disease Mother     Social History Social History   Tobacco Use   Smoking status: Every Day    Types: Cigars   Smokeless tobacco: Current   Tobacco comments:    He smokes cigars occassionally  Vaping Use   Vaping status: Never Used  Substance Use Topics   Alcohol use: No   Drug use: No     Allergies   Levofloxacin, Levaquin [levofloxacin in d5w], and Januvia [sitagliptin]   Review of Systems Review of Systems :negative unless otherwise stated in HPI.      Physical Exam Triage Vital Signs ED Triage Vitals  Encounter Vitals Group     BP 09/24/23 1001 (!) 146/72     Systolic BP Percentile --      Diastolic BP Percentile --      Pulse Rate 09/24/23 1001 74     Resp 09/24/23 1001 18     Temp 09/24/23 1001 (!) 97.5 F (36.4 C)     Temp Source 09/24/23 1001 Oral     SpO2 09/24/23 1001 93 %     Weight --      Height --      Head Circumference --      Peak Flow --      Pain Score 09/24/23 1000 0     Pain Loc --      Pain Education --      Exclude from Growth Chart --    No data found.  Updated Vital  Signs BP (!) 146/72 (BP Location: Right Arm)   Pulse 74   Temp (!) 97.5 F (36.4 C) (Oral)   Resp 18   SpO2 93%   Visual Acuity Right Eye Distance:   Left Eye Distance:   Bilateral Distance:    Right Eye Near:   Left Eye Near:    Bilateral Near:     Physical Exam  GEN: alert, well appearing elderly  male, in no acute distress  EYES: no scleral injection or discharge  RESP: no increased work of breathing, MSK: no extremity edema, non-tender BUE  SKIN: warm and dry; erythematous patch on upper left arm, very dry skin bilateral UE, face and hands, erythematous scaly patch near left ear tragus   UC Treatments / Results  Labs (all labs ordered are listed, but only abnormal results are displayed) Labs Reviewed - No data to display  EKG   Radiology No results found.  Procedures Procedures (including critical care time)  Medications Ordered in UC Medications - No data to display  Initial Impression / Assessment and Plan / UC Course  I have reviewed the triage vital signs and the nursing notes.  Pertinent labs & imaging results that were available during my care of the patient were reviewed by me and considered in my medical decision making (see chart for details).     Patient is a 89 y.o. malewho presents for rash.  Overall, patient is well-appearing and well-hydrated.  Vital signs stable.  Daniel Bray is afebrile.  Exam concerning for contact dermatitis.  Treat with prednisone taper and steroid ointment.  No sign of infection to suggest antibiotics or antifungal at this time.  Does not appear to be Shingles-like rash.    Reviewed expectations regarding course of current medical issues.  All questions asked were answered.  Outlined signs and symptoms indicating need for more acute intervention. Patient verbalized understanding. After Visit Summary given.   Final Clinical Impressions(s) / UC Diagnoses   Final diagnoses:  Rash and nonspecific skin eruption      Discharge Instructions      Stop by the pharmacy to pick up your prescriptions.  Follow up with your primary care provider or return to the urgent care, if not improving.      ED Prescriptions     Medication Sig Dispense Auth. Provider   predniSONE (STERAPRED UNI-PAK 21 TAB) 10 MG (21) TBPK tablet Take by mouth daily. Take 6 tabs by mouth daily for 1, then 5 tabs for 1 day, then 4 tabs for 1 day, then 3 tabs for 1 day, then 2 tabs for 1 day, then 1 tab for 1 day. 21 tablet Emrys Mckamie, DO   triamcinolone ointment (KENALOG) 0.1 % Apply 1 Application topically 2 (two) times daily. 30 g Katha Cabal, DO      PDMP not reviewed this encounter.              Katha Cabal, DO 09/24/23 1412

## 2023-09-24 NOTE — ED Triage Notes (Addendum)
 Patient presents to [facility] for possible rash x 2 days ago. States he is itching on BLE and face. No OTC meds for symptom relief. No hx of skin disorders.   Denies fever. No changes to diet, meds, or products.

## 2023-10-22 ENCOUNTER — Emergency Department

## 2023-10-22 ENCOUNTER — Observation Stay
Admission: EM | Admit: 2023-10-22 | Discharge: 2023-10-23 | Disposition: A | Discharge status: Home / Self Care | Attending: Osteopathic Medicine | Admitting: Osteopathic Medicine

## 2023-10-22 ENCOUNTER — Other Ambulatory Visit: Payer: Self-pay

## 2023-10-22 ENCOUNTER — Observation Stay
Admit: 2023-10-22 | Discharge: 2023-10-22 | Disposition: A | Discharge status: Home / Self Care | Attending: Internal Medicine | Admitting: Internal Medicine

## 2023-10-22 DIAGNOSIS — Z7984 Long term (current) use of oral hypoglycemic drugs: Secondary | ICD-10-CM | POA: Diagnosis not present

## 2023-10-22 DIAGNOSIS — K5904 Chronic idiopathic constipation: Secondary | ICD-10-CM | POA: Diagnosis not present

## 2023-10-22 DIAGNOSIS — I2510 Atherosclerotic heart disease of native coronary artery without angina pectoris: Secondary | ICD-10-CM | POA: Diagnosis not present

## 2023-10-22 DIAGNOSIS — D472 Monoclonal gammopathy: Secondary | ICD-10-CM | POA: Diagnosis not present

## 2023-10-22 DIAGNOSIS — E1165 Type 2 diabetes mellitus with hyperglycemia: Secondary | ICD-10-CM | POA: Insufficient documentation

## 2023-10-22 DIAGNOSIS — K567 Ileus, unspecified: Secondary | ICD-10-CM | POA: Diagnosis present

## 2023-10-22 DIAGNOSIS — K529 Noninfective gastroenteritis and colitis, unspecified: Secondary | ICD-10-CM | POA: Diagnosis not present

## 2023-10-22 DIAGNOSIS — I429 Cardiomyopathy, unspecified: Secondary | ICD-10-CM | POA: Diagnosis not present

## 2023-10-22 DIAGNOSIS — E854 Organ-limited amyloidosis: Secondary | ICD-10-CM | POA: Diagnosis not present

## 2023-10-22 DIAGNOSIS — I130 Hypertensive heart and chronic kidney disease with heart failure and stage 1 through stage 4 chronic kidney disease, or unspecified chronic kidney disease: Secondary | ICD-10-CM | POA: Diagnosis not present

## 2023-10-22 DIAGNOSIS — R140 Abdominal distension (gaseous): Secondary | ICD-10-CM | POA: Diagnosis present

## 2023-10-22 DIAGNOSIS — Z905 Acquired absence of kidney: Secondary | ICD-10-CM | POA: Insufficient documentation

## 2023-10-22 DIAGNOSIS — Z7985 Long-term (current) use of injectable non-insulin antidiabetic drugs: Secondary | ICD-10-CM | POA: Diagnosis not present

## 2023-10-22 DIAGNOSIS — C9140 Hairy cell leukemia not having achieved remission: Secondary | ICD-10-CM | POA: Diagnosis not present

## 2023-10-22 DIAGNOSIS — Z7901 Long term (current) use of anticoagulants: Secondary | ICD-10-CM | POA: Insufficient documentation

## 2023-10-22 DIAGNOSIS — K560 Paralytic ileus: Secondary | ICD-10-CM | POA: Diagnosis not present

## 2023-10-22 DIAGNOSIS — R1084 Generalized abdominal pain: Principal | ICD-10-CM

## 2023-10-22 DIAGNOSIS — C9110 Chronic lymphocytic leukemia of B-cell type not having achieved remission: Secondary | ICD-10-CM | POA: Diagnosis not present

## 2023-10-22 DIAGNOSIS — Z79890 Hormone replacement therapy (postmenopausal): Secondary | ICD-10-CM | POA: Insufficient documentation

## 2023-10-22 DIAGNOSIS — F17210 Nicotine dependence, cigarettes, uncomplicated: Secondary | ICD-10-CM | POA: Insufficient documentation

## 2023-10-22 DIAGNOSIS — I214 Non-ST elevation (NSTEMI) myocardial infarction: Secondary | ICD-10-CM | POA: Diagnosis not present

## 2023-10-22 DIAGNOSIS — N1831 Chronic kidney disease, stage 3a: Secondary | ICD-10-CM | POA: Insufficient documentation

## 2023-10-22 DIAGNOSIS — R110 Nausea: Secondary | ICD-10-CM

## 2023-10-22 DIAGNOSIS — R7989 Other specified abnormal findings of blood chemistry: Secondary | ICD-10-CM | POA: Diagnosis not present

## 2023-10-22 DIAGNOSIS — Z79899 Other long term (current) drug therapy: Secondary | ICD-10-CM | POA: Insufficient documentation

## 2023-10-22 DIAGNOSIS — I5022 Chronic systolic (congestive) heart failure: Secondary | ICD-10-CM | POA: Diagnosis not present

## 2023-10-22 DIAGNOSIS — R197 Diarrhea, unspecified: Secondary | ICD-10-CM | POA: Diagnosis present

## 2023-10-22 DIAGNOSIS — I110 Hypertensive heart disease with heart failure: Secondary | ICD-10-CM | POA: Insufficient documentation

## 2023-10-22 LAB — CBC WITH DIFFERENTIAL/PLATELET
Abs Immature Granulocytes: 0.08 10*3/uL — ABNORMAL HIGH (ref 0.00–0.07)
Basophils Absolute: 0.1 10*3/uL (ref 0.0–0.1)
Basophils Relative: 1 %
Eosinophils Absolute: 0.2 10*3/uL (ref 0.0–0.5)
Eosinophils Relative: 1 %
HCT: 47.4 % (ref 39.0–52.0)
Hemoglobin: 15.2 g/dL (ref 13.0–17.0)
Immature Granulocytes: 0 %
Lymphocytes Relative: 49 %
Lymphs Abs: 10.4 10*3/uL — ABNORMAL HIGH (ref 0.7–4.0)
MCH: 34.6 pg — ABNORMAL HIGH (ref 26.0–34.0)
MCHC: 32.1 g/dL (ref 30.0–36.0)
MCV: 108 fL — ABNORMAL HIGH (ref 80.0–100.0)
Monocytes Absolute: 1.7 10*3/uL — ABNORMAL HIGH (ref 0.1–1.0)
Monocytes Relative: 8 %
Neutro Abs: 8.5 10*3/uL — ABNORMAL HIGH (ref 1.7–7.7)
Neutrophils Relative %: 41 %
Platelets: 206 10*3/uL (ref 150–400)
RBC: 4.39 MIL/uL (ref 4.22–5.81)
RDW: 12.7 % (ref 11.5–15.5)
Smear Review: NORMAL
WBC: 20.9 10*3/uL — ABNORMAL HIGH (ref 4.0–10.5)
nRBC: 0 % (ref 0.0–0.2)

## 2023-10-22 LAB — BASIC METABOLIC PANEL
Anion gap: 4 — ABNORMAL LOW (ref 5–15)
BUN: 23 mg/dL (ref 8–23)
CO2: 28 mmol/L (ref 22–32)
Calcium: 10.5 mg/dL — ABNORMAL HIGH (ref 8.9–10.3)
Chloride: 105 mmol/L (ref 98–111)
Creatinine, Ser: 1.16 mg/dL (ref 0.61–1.24)
GFR, Estimated: >60 mL/min (ref >60)
Glucose, Bld: 211 mg/dL — ABNORMAL HIGH (ref 70–99)
Potassium: 4.6 mmol/L (ref 3.5–5.1)
Sodium: 137 mmol/L (ref 135–145)

## 2023-10-22 LAB — CBC
HCT: 47.4 % (ref 39.0–52.0)
Hemoglobin: 15.2 g/dL (ref 13.0–17.0)
MCH: 34.1 pg — ABNORMAL HIGH (ref 26.0–34.0)
MCHC: 32.1 g/dL (ref 30.0–36.0)
MCV: 106.3 fL — ABNORMAL HIGH (ref 80.0–100.0)
Platelets: 205 10*3/uL (ref 150–400)
RBC: 4.46 MIL/uL (ref 4.22–5.81)
RDW: 12.7 % (ref 11.5–15.5)
WBC: 20.5 10*3/uL — ABNORMAL HIGH (ref 4.0–10.5)
nRBC: 0 % (ref 0.0–0.2)

## 2023-10-22 LAB — GASTROINTESTINAL PANEL BY PCR, STOOL (REPLACES STOOL CULTURE)

## 2023-10-22 LAB — URINALYSIS, ROUTINE W REFLEX MICROSCOPIC
Bilirubin Urine: NEGATIVE
Glucose, UA: 150 mg/dL — AB
Hgb urine dipstick: NEGATIVE
Ketones, ur: NEGATIVE mg/dL
Leukocytes,Ua: NEGATIVE
Nitrite: NEGATIVE
Protein, ur: NEGATIVE mg/dL
Specific Gravity, Urine: 1.029 (ref 1.005–1.030)
pH: 5 (ref 5.0–8.0)

## 2023-10-22 LAB — HEPATIC FUNCTION PANEL
ALT: 22 U/L (ref 0–44)
AST: 24 U/L (ref 15–41)
Albumin: 4 g/dL (ref 3.5–5.0)
Alkaline Phosphatase: 75 U/L (ref 38–126)
Bilirubin, Direct: <0.1 mg/dL (ref 0.0–0.2)
Total Bilirubin: 0.6 mg/dL (ref 0.0–1.2)
Total Protein: 7.5 g/dL (ref 6.5–8.1)

## 2023-10-22 LAB — TROPONIN I (HIGH SENSITIVITY)
Troponin I (High Sensitivity): 108 ng/L (ref <18)
Troponin I (High Sensitivity): 96 ng/L — ABNORMAL HIGH (ref <18)

## 2023-10-22 LAB — CBG MONITORING, ED
Glucose-Capillary: 123 mg/dL — ABNORMAL HIGH (ref 70–99)
Glucose-Capillary: 102 mg/dL — ABNORMAL HIGH (ref 70–99)
Glucose-Capillary: 173 mg/dL — ABNORMAL HIGH (ref 70–99)

## 2023-10-22 LAB — PATHOLOGIST SMEAR REVIEW

## 2023-10-22 LAB — PROTIME-INR
INR: 1.2 (ref 0.8–1.2)
Prothrombin Time: 14.9 s (ref 11.4–15.2)

## 2023-10-22 LAB — APTT: aPTT: 31 s (ref 24–36)

## 2023-10-22 LAB — HEPARIN LEVEL (UNFRACTIONATED): Heparin Unfractionated: 0.41 [IU]/mL (ref 0.30–0.70)

## 2023-10-22 LAB — LIPASE, BLOOD: Lipase: 20 U/L (ref 11–51)

## 2023-10-22 MED ORDER — MELATONIN 5 MG PO TABS
10.0000 mg | ORAL_TABLET | Freq: Every evening | ORAL | Status: DC | PRN
  Administered 2023-10-22: 10 mg via ORAL
  Filled 2023-10-22: qty 2

## 2023-10-22 MED ORDER — HEPARIN SODIUM (PORCINE) 5000 UNIT/ML IJ SOLN: 60.0000 [IU]/kg | Freq: Once | INTRAMUSCULAR | Status: DC

## 2023-10-22 MED ORDER — INSULIN ASPART 100 UNIT/ML IJ SOLN
0.0000 [IU] | Freq: Three times a day (TID) | INTRAMUSCULAR | Status: DC
  Administered 2023-10-22: 3 [IU] via SUBCUTANEOUS
  Administered 2023-10-23: 5 [IU] via SUBCUTANEOUS
  Filled 2023-10-22 (×2): qty 1

## 2023-10-22 MED ORDER — LEVOTHYROXINE SODIUM 50 MCG PO TABS
75.0000 ug | ORAL_TABLET | Freq: Every day | ORAL | Status: DC
  Administered 2023-10-23: 75 ug via ORAL
  Filled 2023-10-22: qty 2

## 2023-10-22 MED ORDER — CARVEDILOL 6.25 MG PO TABS
6.2500 mg | ORAL_TABLET | Freq: Two times a day (BID) | ORAL | Status: DC
Start: 2023-10-22 — End: 2023-10-22

## 2023-10-22 MED ORDER — ASPIRIN 81 MG PO TBEC
81.0000 mg | DELAYED_RELEASE_TABLET | Freq: Every day | ORAL | Status: DC
  Administered 2023-10-22: 81 mg via ORAL
  Filled 2023-10-22 (×2): qty 1

## 2023-10-22 MED ORDER — ROSUVASTATIN CALCIUM 20 MG PO TABS
40.0000 mg | ORAL_TABLET | Freq: Every day | ORAL | Status: DC
  Administered 2023-10-22: 40 mg via ORAL
  Filled 2023-10-22 (×2): qty 2

## 2023-10-22 MED ORDER — ONDANSETRON HCL 4 MG/2ML IJ SOLN
4.0000 mg | Freq: Once | INTRAMUSCULAR | Status: AC
  Administered 2023-10-22: 4 mg via INTRAVENOUS
  Filled 2023-10-22: qty 2

## 2023-10-22 MED ORDER — CETIRIZINE HCL 10 MG PO TABS
5.0000 mg | ORAL_TABLET | Freq: Every evening | ORAL | Status: DC
  Administered 2023-10-22: 5 mg via ORAL
  Filled 2023-10-22 (×2): qty 1

## 2023-10-22 MED ORDER — LOPERAMIDE HCL 2 MG PO CAPS: 2.0000 mg | ORAL_CAPSULE | ORAL | Status: DC | PRN

## 2023-10-22 MED ORDER — AMLODIPINE BESYLATE 5 MG PO TABS: 2.5000 mg | ORAL_TABLET | Freq: Every day | ORAL | Status: DC

## 2023-10-22 MED ORDER — ISOSORBIDE MONONITRATE ER 60 MG PO TB24
60.0000 mg | ORAL_TABLET | Freq: Every day | ORAL | Status: DC
  Administered 2023-10-22: 60 mg via ORAL
  Filled 2023-10-22 (×2): qty 1

## 2023-10-22 MED ORDER — HEPARIN (PORCINE) 25000 UT/250ML-% IV SOLN: 14.0000 [IU]/kg/h | INTRAVENOUS | Status: DC

## 2023-10-22 MED ORDER — HYDRALAZINE HCL 50 MG PO TABS: 25.0000 mg | ORAL_TABLET | Freq: Two times a day (BID) | ORAL | Status: DC

## 2023-10-22 MED ORDER — PIPERACILLIN-TAZOBACTAM 3.375 G IVPB 30 MIN
3.3750 g | Freq: Once | INTRAVENOUS | Status: AC
  Administered 2023-10-22: 3.375 g via INTRAVENOUS
  Filled 2023-10-22 (×2): qty 50

## 2023-10-22 MED ORDER — ACETAMINOPHEN 325 MG PO TABS: 650.0000 mg | ORAL_TABLET | Freq: Four times a day (QID) | ORAL | Status: DC | PRN

## 2023-10-22 MED ORDER — MORPHINE SULFATE (PF) 2 MG/ML IV SOLN: 2.0000 mg | INTRAVENOUS | Status: DC | PRN

## 2023-10-22 MED ORDER — ONDANSETRON HCL 4 MG/2ML IJ SOLN: 4.0000 mg | Freq: Four times a day (QID) | INTRAMUSCULAR | Status: DC | PRN

## 2023-10-22 MED ORDER — ONDANSETRON HCL 4 MG PO TABS: 4.0000 mg | ORAL_TABLET | Freq: Four times a day (QID) | ORAL | Status: DC | PRN

## 2023-10-22 MED ORDER — HEPARIN (PORCINE) 25000 UT/250ML-% IV SOLN
800.0000 [IU]/h | INTRAVENOUS | Status: DC
  Administered 2023-10-22 – 2023-10-23 (×2): 800 [IU]/h via INTRAVENOUS
  Filled 2023-10-22 (×2): qty 250

## 2023-10-22 MED ORDER — SODIUM CHLORIDE 0.9 % IV BOLUS
500.0000 mL | Freq: Once | INTRAVENOUS | Status: AC
  Administered 2023-10-22: 500 mL via INTRAVENOUS

## 2023-10-22 MED ORDER — INSULIN ASPART 100 UNIT/ML IJ SOLN: 0.0000 [IU] | INTRAMUSCULAR | Status: DC

## 2023-10-22 MED ORDER — IPRATROPIUM BROMIDE 0.06 % NA SOLN
2.0000 | Freq: Four times a day (QID) | NASAL | Status: DC
Start: 2023-10-22 — End: 2023-10-22

## 2023-10-22 MED ORDER — DICLOFENAC SODIUM 1 % EX GEL: 2.0000 g | Freq: Four times a day (QID) | CUTANEOUS | Status: DC | PRN

## 2023-10-22 MED ORDER — GABAPENTIN 300 MG PO CAPS
300.0000 mg | ORAL_CAPSULE | Freq: Every day | ORAL | Status: DC
  Administered 2023-10-22: 300 mg via ORAL
  Filled 2023-10-22: qty 1

## 2023-10-22 MED ORDER — ALBUTEROL SULFATE (2.5 MG/3ML) 0.083% IN NEBU: 2.5000 mg | INHALATION_SOLUTION | RESPIRATORY_TRACT | Status: AC | PRN

## 2023-10-22 MED ORDER — ACETAMINOPHEN 650 MG RE SUPP: 650.0000 mg | Freq: Four times a day (QID) | RECTAL | Status: DC | PRN

## 2023-10-22 MED ORDER — SODIUM CHLORIDE 0.9 % IV BOLUS: 1000.0000 mL | Freq: Once | INTRAVENOUS | Status: DC

## 2023-10-22 MED ORDER — PANTOPRAZOLE SODIUM 40 MG PO TBEC
40.0000 mg | DELAYED_RELEASE_TABLET | Freq: Every day | ORAL | Status: DC
  Administered 2023-10-22 – 2023-10-23 (×2): 40 mg via ORAL
  Filled 2023-10-22 (×2): qty 1

## 2023-10-22 MED ORDER — HEPARIN BOLUS VIA INFUSION
3600.0000 [IU] | Freq: Once | INTRAVENOUS | Status: AC
  Administered 2023-10-22: 3600 [IU] via INTRAVENOUS
  Filled 2023-10-22: qty 3600

## 2023-10-22 MED ORDER — TRIAMCINOLONE ACETONIDE 0.1 % EX OINT
1.0000 | TOPICAL_OINTMENT | Freq: Two times a day (BID) | CUTANEOUS | Status: DC
  Filled 2023-10-22: qty 15

## 2023-10-22 NOTE — ED Provider Notes (Signed)
 [facility] Provider Note    Event Date/Time   First MD Initiated Contact with Patient 10/22/23 [phone]     (approximate)   History   Bloated   HPI  Daniel Bray is a 89 y.o. male who presents to the ED from home with a 1 day history of abdominal bloating, gassiness and nausea.  Denies associated fever/chills, chest pain, shortness of breath, dysuria, vomiting or diarrhea.     Past Medical History   Past Medical History:  Diagnosis Date   Cancer (HCC)    kidney   Cancer (HCC)    pancreas   Cancer (HCC)    colon    Diabetes mellitus without complication (HCC)    Hyperlipidemia    Hypertension    Myocardial infarction (HCC)    Streptococcal infection    Strep Bovis   Thyroid disease      Active Problem List   Patient Active Problem List   Diagnosis Date Noted   NSTEMI (non-ST elevated myocardial infarction) (HCC) 10/22/2023   General weakness 07/22/2023   Weight loss 07/20/2023   Weakness 01/17/2022   SOB (shortness of breath) 11/23/2021   Type II diabetes mellitus with renal manifestations (HCC) 11/23/2021   Hiccups    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   Diabetic polyneuropathy associated with type 2 diabetes mellitus (HCC)    Ileus (HCC) 05/29/2020   Acute renal failure superimposed on stage 3a chronic kidney disease (HCC) 05/28/2020   Nausea vomiting and diarrhea 05/28/2020   Abdominal pain 05/28/2020   Hyperkalemia 05/28/2020   Hypercalcemia 05/28/2020   Elevated troponin 01/24/2020   Leukocytosis 01/24/2020   HLD (hyperlipidemia) 01/24/2020   GERD (gastroesophageal reflux disease) 01/24/2020   Anxiety 01/24/2020   CAD (coronary artery disease) 01/24/2020   Tobacco abuse 01/24/2020   Stage 3b chronic kidney disease (HCC) 01/24/2020   Bradycardia 01/24/2020   Laceration of forehead    Syncope 10/12/2019   Type 2 diabetes mellitus with hyperlipidemia (HCC) 10/12/2019   Accelerated hypertension 10/12/2019   History of renal cell  carcinoma s/p nephrectomy 10/12/2019   SBO (small bowel obstruction) (HCC) 10/12/2019   Hypothyroidism 10/12/2019     Past Surgical History   Past Surgical History:  Procedure Laterality Date   CARDIAC ELECTROPHYSIOLOGY STUDY AND ABLATION     CARDIAC SURGERY     CORONARY ANGIOPLASTY WITH STENT PLACEMENT     CORONARY ARTERY BYPASS GRAFT     x3   LEFT HEART CATH AND CORONARY ANGIOGRAPHY N/A 08/03/2021   Procedure: LEFT HEART CATH AND CORONARY ANGIOGRAPHY;  Surgeon: Armando Reichert, MD;  Location: [facility] INVASIVE CV LAB;  Service: Cardiovascular;  Laterality: N/A;   PANCREAS SURGERY     RIGHT HEART CATH N/A 01/18/2022   Procedure: RIGHT HEART CATH;  Surgeon: Alwyn Pea, MD;  Location: [facility] INVASIVE CV LAB;  Service: Cardiovascular;  Laterality: N/A;   SPLENECTOMY       Home Medications   Prior to Admission medications   Medication Sig Start Date End Date Taking? Authorizing Provider  acetaminophen (TYLENOL) 650 MG CR tablet Take 1,300 mg by mouth every 8 (eight) hours as needed for pain.    [provider]  albuterol (VENTOLIN HFA) 108 (90 Base) MCG/ACT inhaler Inhale 2 puffs into the lungs every 2 (two) hours as needed for wheezing or shortness of breath. 11/24/21   Enedina Finner, MD  amLODipine (NORVASC) 2.5 MG tablet Take 2.5 mg by mouth daily.    [provider]  aspirin EC 81 MG tablet Take 81 mg by mouth daily.    [provider]  Calcium Carbonate-Simethicone (ALKA-SELTZER HEARTBURN + GAS) 750-80 MG CHEW Chew 2 each by mouth daily as needed (gas/heartburn).    [provider]  carvedilol (COREG) 6.25 MG tablet Take 1 tablet (6.25 mg total) by mouth 2 (two) times daily. 01/18/22 07/20/23  Tresa Moore, MD  clobetasol cream (TEMOVATE) 0.05 % Apply 1 Application topically 2 (two) times daily as needed. 03/21/23 03/20/24  [provider]  diclofenac Sodium (VOLTAREN) 1 % GEL Apply 1 application topically daily as needed (pain).     [provider]  Dulaglutide 3 MG/0.5ML SOPN Inject 3 mg into the skin every Monday.    [provider]  empagliflozin (JARDIANCE) 25 MG TABS tablet Take 25 mg by mouth daily.    [provider]  furosemide (LASIX) 20 MG tablet Take 1 tablet (20 mg total) by mouth daily. Do not take if your systolic BP is less than 110 01/19/22 07/20/23  Sreenath, Sudheer B, MD  gabapentin (NEURONTIN) 300 MG capsule Take 300 mg by mouth at bedtime.    [provider]  glipiZIDE (GLUCOTROL) 10 MG tablet Take 10 mg by mouth 2 (two) times daily with a meal.     [provider]  hydrALAZINE (APRESOLINE) 25 MG tablet Take 25 mg by mouth 2 (two) times daily.    [provider]  Inulin (FIBER CHOICE PO) Take 5 tablets by mouth with breakfast, with lunch, and with evening meal.    [provider]  ipratropium (ATROVENT) 0.06 % nasal spray Place 2 sprays into both nostrils 4 (four) times daily. 01/24/23   Domenick Gong, MD  isosorbide mononitrate (IMDUR) 60 MG 24 hr tablet Take 60 mg by mouth daily.    [provider]  levocetirizine (XYZAL) 5 MG tablet Take 5 mg by mouth every evening. 11/27/20   [provider]  levothyroxine (SYNTHROID) 75 MCG tablet Take 75 mcg by mouth daily before breakfast.     [provider]  Melatonin 10 MG TABS Take 10 mg by mouth at bedtime as needed (sleep).    [provider]  metFORMIN (GLUCOPHAGE-XR) 500 MG 24 hr tablet Take 1 tablet by mouth 2 (two) times daily. 05/12/23 05/11/24  [provider]  Multiple Vitamins-Minerals (PRESERVISION AREDS 2+MULTI VIT PO) Take 1 capsule by mouth in the morning and at bedtime.    [provider]  omeprazole (PRILOSEC) 40 MG capsule Take 40 mg by mouth daily.     [provider]  oxymetazoline (AFRIN) 0.05 % nasal spray Place 1 spray into both nostrils at bedtime.    [provider]  predniSONE (STERAPRED UNI-PAK 21 TAB)  10 MG (21) TBPK tablet Take by mouth daily. Take 6 tabs by mouth daily for 1, then 5 tabs for 1 day, then 4 tabs for 1 day, then 3 tabs for 1 day, then 2 tabs for 1 day, then 1 tab for 1 day. 09/24/23   Brimage, Seward Meth, DO  rosuvastatin (CRESTOR) 40 MG tablet Take 40 mg by mouth daily.    [provider]  SENNA PLUS 8.6-50 MG tablet Take 2 tablets by mouth 2 (two) times daily.    [provider]  Spacer/Aero-Holding Chambers (AEROCHAMBER MV) inhaler Use as instructed 01/24/23   Domenick Gong, MD  triamcinolone ointment (KENALOG) 0.1 % Apply 1 Application topically 2 (two) times daily. 09/24/23   Katha Cabal, DO  VYNDAMAX  61 MG CAPS Take by mouth. 07/19/22   [provider]     Allergies  Levofloxacin, Levaquin [levofloxacin in d5w], and Januvia [sitagliptin]   Family History   Family History  Problem Relation Age of Onset   Pancreatic cancer Mother    Liver cancer Mother    Heart disease Mother      Physical Exam  Triage Vital Signs: ED Triage Vitals  Encounter Vitals Group     BP 10/22/23 0353 (!) 152/97     Systolic BP Percentile --      Diastolic BP Percentile --      Pulse Rate 10/22/23 0353 93     Resp 10/22/23 0353 18     Temp --      Temp src --      SpO2 10/22/23 0353 99 %     Weight --      Height --      Head Circumference --      Peak Flow --      Pain Score 10/22/23 0348 0     Pain Loc --      Pain Education --      Exclude from Growth Chart --     Updated Vital Signs: BP (!) 152/97   Pulse 93   Temp (!) 97.5 F (36.4 C) (Oral)   Resp 18   Wt 60.8 kg Comment: From O/V note from 10/17/23  SpO2 99%   BMI 19.23 kg/m    General: Awake, no distress.  CV:  RRR.  Good peripheral perfusion.  Resp:  Normal effort.  CTAB. Abd:  Nontender to light or deep palpation.  No distention.  Other:  No calf swelling or tenderness.   ED Results / Procedures / Treatments  Labs (all labs ordered are listed, but only abnormal  results are displayed) Labs Reviewed  BASIC METABOLIC PANEL - Abnormal; Notable for the following components:      Result Value   Glucose, Bld 211 (*)    Calcium 10.5 (*)    Anion gap 4 (*)    All other components within normal limits  CBC - Abnormal; Notable for the following components:   WBC 20.5 (*)    MCV 106.3 (*)    MCH 34.1 (*)    All other components within normal limits  TROPONIN I (HIGH SENSITIVITY) - Abnormal; Notable for the following components:   Troponin I (High Sensitivity) 108 (*)    All other components within normal limits  TROPONIN I (HIGH SENSITIVITY) - Abnormal; Notable for the following components:   Troponin I (High Sensitivity) 96 (*)    All other components within normal limits  HEPATIC FUNCTION PANEL  LIPASE, BLOOD  URINALYSIS, ROUTINE W REFLEX MICROSCOPIC  APTT  PROTIME-INR  CBC WITH DIFFERENTIAL/PLATELET     EKG  ED ECG REPORT I, Jettie Mannor J, the attending physician, personally viewed and interpreted this ECG.   Date: 10/22/2023  EKG Time: 0407  Rate: 91  Rhythm: normal sinus rhythm  Axis: LAD  Intervals:none  ST&T Change: Nonspecific    RADIOLOGY I have independently visualized and interpreted patient's imaging study as well as noted the radiology interpretation:  3 way abdomen: Partial/early SBO, no pneumoperitoneum, no acute cardiopulmonary process  CT abdomen/pelvis: Distended small bowel loops in left abdomen similar to previous study, stable pancreas nodule  Official radiology report(s): CT ABDOMEN PELVIS WO CONTRAST Result Date: 10/22/2023 CLINICAL DATA:  Abdominal bloating. EXAM: CT ABDOMEN AND PELVIS WITHOUT CONTRAST TECHNIQUE: Multidetector CT  imaging of the abdomen and pelvis was performed following the standard protocol without IV contrast. RADIATION DOSE REDUCTION: This exam was performed according to the departmental dose-optimization program which includes automated exposure control, adjustment of the mA and/or kV  according to patient size and/or use of iterative reconstruction technique. COMPARISON:  12/02/2020. FINDINGS: Lower chest: Dependent atelectasis in the lung bases. Hepatobiliary: Tiny hypodensity in the dome of the left liver is stable consistent with benign etiology such as a cyst. No followup imaging is recommended. There is no evidence for gallstones, gallbladder wall thickening, or pericholecystic fluid. No intrahepatic or extrahepatic biliary dilation. Pancreas: Pancreas is diffusely atrophic. 1.8 x 1.6 cm soft tissue nodule in the uncinate process is stable since a 12/02/2020 exam at which time it showed avid arterial phase enhancement. Spleen: Splenectomy. Adrenals/Urinary Tract: No adrenal nodule or mass. Several punctate nonobstructing stones are seen in the right kidney 8 mm hyperattenuating subcapsular lesion anterior interpolar right kidney is too small to characterize but likely a cyst complicated by proteinaceous debris or hemorrhage. No followup imaging is recommended. Left kidney surgically absent. No right hydroureter. Bladder is nondistended and partially obscured by beam hardening artifact from right hip replacement. Stomach/Bowel: Stomach is distended with gas and food. Duodenum is normally positioned as is the ligament of Treitz. Small bowel loops in the left abdomen are dilated up to 2.7 cm diameter in a similar distribution to the previous study. Small bowel loops in the pelvis are nondilated. Patient is status post subtotal colectomy with diverticular disease noted in the distal left colon. Fluid in the rectum can be seen in the setting of diarrhea. Vascular/Lymphatic: There is moderate atherosclerotic calcification of the abdominal aorta without aneurysm. There is no gastrohepatic or hepatoduodenal ligament lymphadenopathy. No retroperitoneal or mesenteric lymphadenopathy. No pelvic sidewall lymphadenopathy. Reproductive: Prostate gland is enlarged. Other: No intraperitoneal free fluid.  Musculoskeletal: Status post right total hip replacement. Degenerative disc disease noted lower lumbar spine. IMPRESSION: 1. Distended small bowel loops in the left abdomen in a similar distribution to the previous study although the degree of small-bowel distention is less than on the prior study. Small bowel loops in the pelvis are nondilated. Imaging features are nonspecific but could reflect underlying small bowel stricture. 2. 1.8 x 1.6 cm soft tissue nodule in the uncinate process of the pancreas is stable since a 12/02/2020 exam at which time it showed avid arterial phase enhancement. This same lesion was present on a noncontrast CT of 10/12/2019 and measured 3.3 x 2.8 cm at that time. Previous exam raise the question of metastatic deposit from renal cell carcinoma. 3. Status post subtotal colectomy with diverticular disease in the distal left colon. 4. Punctate nonobstructing right renal stones. 5. Prostatomegaly. 6.  Aortic Atherosclerois (ICD10-170.0) Electronically Signed   By: Kennith Center M.D.   On: 10/22/2023 06:17   DG Abdomen Acute W/Chest Result Date: 10/22/2023 CLINICAL DATA:  89 year old male with gas and bloating. EXAM: DG ABDOMEN ACUTE WITH 1 VIEW CHEST COMPARISON:  Chest radiographs 07/20/2023. Abdominal radiographs 12/04/2020 and earlier. FINDINGS: PA view of the chest. Chronic CABG. Stable cardiac size and mediastinal contours. Visualized tracheal air column is within normal limits. Lung volumes remain within normal limits. The lungs appear stable, clear. No pneumothorax or pneumoperitoneum. Upright and supine views of the abdomen and pelvis. Chronic right hip arthroplasty. Extensive chronic surgical clips along the abdominal aorta in the abdomen, visible on prior 2022 CTA. Superimposed numerous small bowel air-fluid levels throughout the left abdomen, although mostly  nondilated small bowel there on the supine views. More featureless appearing mid abdominal bowel loops. No  pneumoperitoneum. No acute osseous abnormality identified. IMPRESSION: 1. Bowel-gas pattern compatible with Partial or Early Small Bowel Obstruction. No pneumoperitoneum. 2.  No acute cardiopulmonary abnormality. Electronically Signed   By: Odessa Fleming M.D.   On: 10/22/2023 05:03     PROCEDURES:  Critical Care performed: Yes, see critical care procedure note(s)  CRITICAL CARE Performed by: Irean Hong   Total critical care time: 30 minutes  Critical care time was exclusive of separately billable procedures and treating other patients.  Critical care was necessary to treat or prevent imminent or life-threatening deterioration.  Critical care was time spent personally by me on the following activities: development of treatment plan with patient and/or surrogate as well as nursing, discussions with consultants, evaluation of patient's response to treatment, examination of patient, obtaining history from patient or surrogate, ordering and performing treatments and interventions, ordering and review of laboratory studies, ordering and review of radiographic studies, pulse oximetry and re-evaluation of patient's condition.   Marland Kitchen1-3 Lead EKG Interpretation  Performed by: Irean Hong, MD Authorized by: Irean Hong, MD     Interpretation: normal     ECG rate:  90   ECG rate assessment: normal     Rhythm: sinus rhythm     Ectopy: none     Conduction: normal   Comments:     Patient placed on cardiac monitor to evaluate for arrhythmias    MEDICATIONS ORDERED IN ED: Medications  ondansetron (ZOFRAN) injection 4 mg (has no administration in time range)  acetaminophen (TYLENOL) tablet 650 mg (has no administration in time range)    Or  acetaminophen (TYLENOL) suppository 650 mg (has no administration in time range)  morphine (PF) 2 MG/ML injection 2 mg (has no administration in time range)  ondansetron (ZOFRAN) tablet 4 mg (has no administration in time range)    Or  ondansetron (ZOFRAN)  injection 4 mg (has no administration in time range)  albuterol (PROVENTIL) (2.5 MG/3ML) 0.083% nebulizer solution 2.5 mg (has no administration in time range)  insulin aspart (novoLOG) injection 0-15 Units ( Subcutaneous Not Given 10/22/23 0613)  piperacillin-tazobactam (ZOSYN) IVPB 3.375 g (3.375 g Intravenous New Bag/Given 10/22/23 0622)  heparin bolus via infusion 3,600 Units (has no administration in time range)  heparin ADULT infusion 100 units/mL (25000 units/255mL) (has no administration in time range)  sodium chloride 0.9 % bolus 500 mL (0 mLs Intravenous Stopped 10/22/23 0617)     IMPRESSION / MDM / ASSESSMENT AND PLAN / ED COURSE  I reviewed the triage vital signs and the nursing notes.                             89 year old male presenting with abdominal pain, bloating and nausea. Differential diagnosis includes, but is not limited to, acute appendicitis, renal colic, testicular torsion, urinary tract infection/pyelonephritis, prostatitis,  epididymitis, diverticulitis, small bowel obstruction or ileus, colitis, abdominal aortic aneurysm, gastroenteritis, hernia, etc. I personally reviewed patient's records and note an endocrinology office visit on 10/17/2023 for follow-up type 2 diabetes, hypertension, hyperlipidemia, hyper parathyroidism.  Patient's presentation is most consistent with acute complicated illness / injury requiring diagnostic workup.  The patient is on the cardiac monitor to evaluate for evidence of arrhythmia and/or significant heart rate changes.  Laboratory results demonstrate leukocytosis WBC 20.5, unremarkable electrolytes including LFTs/lipase.  Troponin is elevated at 108.  Three-way  abdomen demonstrates partial/early SBO.  Notably, patient's abdominal pain, gassiness/bloating and nausea have resolved.  He had 2 BMs in the lobby while awaiting treatment room.  Will obtain CT abdomen/pelvis to confirm but clinically symptoms are consistent with ileus.  Hold NG tube  unless patient vomits.  Cover empirically with IV Zosyn.  Given patient's history of ACS, will initiate heparin bolus with drip for elevated troponin.  Will consult hospitalist services for evaluation and admission.    FINAL CLINICAL IMPRESSION(S) / ED DIAGNOSES   Final diagnoses:  Generalized abdominal pain  Nausea  Ileus (HCC)  NSTEMI (non-ST elevated myocardial infarction) (HCC)     Rx / DC Orders   ED Discharge Orders     None        Note:  This document was prepared using Dragon voice recognition software and may include unintentional dictation errors.   Irean Hong, MD 10/22/23 [phone]

## 2023-10-22 NOTE — ED Triage Notes (Signed)
 Pt to ED via POV c/o gas/bloating, reports it has been going on since yesterday morning. Last BM was yesterday. Pt has been unable to sleep due to gas. Denies any abdominal pain, CP, SHOB,

## 2023-10-22 NOTE — ED Notes (Signed)
 Pt arrives from room 18. Pt up to restroom, gait steady. Pt is on cardiac monitor. Denies needs at this time. Wife is at bedside.

## 2023-10-22 NOTE — H&P (Addendum)
 History and Physical    Daniel Bray GEX:[id number] DOB: 1935/07/14 DOA: 10/22/2023  PCP: [facility] (Confirm with patient/family/NH records and if not entered, this has to be entered at [facility] point of entry) Patient coming from: Home  I have personally briefly reviewed patient's old medical records in [facility]  Chief Complaint: Feeling bloated, diarrhea  HPI: Daniel Bray is a 89 y.o. male with medical history significant of CAD status post CABG and PCI, HTN, HLD, chronic HFrEF with LVEF 25-35%, cardiac amyloidosis, CLL, MGUS, hairy cell leukemia, renal cancer status post left nephrectomy, CKD stage IIIa, history of pancreatic cancer status post resection, history of SBO, IIDM, presented with GI symptoms including " feeling bloated" and diarrhea.  Patient has chronic constipation, but he has a poor tolerance to stool softener and laxative, as a result he has been mainly taking fibers for his constipation, family reported that he takes up to 20 pills of fibers every day.  Last night patient ate some packed cheese sandwich and nuts for dinner and soon started develop large amount of continuous gas passing and feeling of bloating but denied any nauseous vomiting or abdominal pain.  No fever or chills.  Overnight patient passed to watery diarrhea and 1 loose bowel movement and feeling the bloating problems improved.  He denied any chest pain shortness of breath.  ED Course: Afebrile, not tachycardia nonhypotensive nonhypoxic.  Blood work showed chronic leukocytosis WBC 20.5 compared to baseline 20-25, hemoglobin 15.2 BUN 23 creatinine 1.1 bicarb 28, glucose 221.  Troponin 108> 96.  CT abdomen pelvis showed distended small bowel loops appear to be nonspecific and low suspicion for SBO.  Chronic soft tissue nodule in pancreas smaller compared to 2021.  Review of Systems: As per HPI otherwise 14 point review of systems negative.    Past Medical History:  Diagnosis  Date   Cancer (HCC)    kidney   Cancer (HCC)    pancreas   Cancer (HCC)    colon    Diabetes mellitus without complication (HCC)    Hyperlipidemia    Hypertension    Myocardial infarction (HCC)    Streptococcal infection    Strep Bovis   Thyroid disease     Past Surgical History:  Procedure Laterality Date   CARDIAC ELECTROPHYSIOLOGY STUDY AND ABLATION     CARDIAC SURGERY     CORONARY ANGIOPLASTY WITH STENT PLACEMENT     CORONARY ARTERY BYPASS GRAFT     x3   LEFT HEART CATH AND CORONARY ANGIOGRAPHY N/A 08/03/2021   Procedure: LEFT HEART CATH AND CORONARY ANGIOGRAPHY;  Surgeon: Armando Reichert, MD;  Location: [facility] INVASIVE CV LAB;  Service: Cardiovascular;  Laterality: N/A;   PANCREAS SURGERY     RIGHT HEART CATH N/A 01/18/2022   Procedure: RIGHT HEART CATH;  Surgeon: Alwyn Pea, MD;  Location: [facility] INVASIVE CV LAB;  Service: Cardiovascular;  Laterality: N/A;   SPLENECTOMY       reports that he has been smoking cigars. He uses smokeless tobacco. He reports that he does not drink alcohol and does not use drugs.  Allergies  Allergen Reactions   Levofloxacin Hives and Other (See Comments)   Levaquin [Levofloxacin In D5w]     Weakness, low blood pressure   Januvia [Sitagliptin] Rash    Family History  Problem Relation Age of Onset   Pancreatic cancer Mother    Liver cancer Mother    Heart disease Mother  Prior to Admission medications   Medication Sig Start Date End Date Taking? Authorizing Provider  acetaminophen (TYLENOL) 650 MG CR tablet Take 1,300 mg by mouth every 8 (eight) hours as needed for pain.    [provider]  albuterol (VENTOLIN HFA) 108 (90 Base) MCG/ACT inhaler Inhale 2 puffs into the lungs every 2 (two) hours as needed for wheezing or shortness of breath. 11/24/21   Enedina Finner, MD  amLODipine (NORVASC) 2.5 MG tablet Take 2.5 mg by mouth daily.    [provider]  aspirin EC 81 MG tablet Take 81 mg by mouth daily.     [provider]  Calcium Carbonate-Simethicone (ALKA-SELTZER HEARTBURN + GAS) 750-80 MG CHEW Chew 2 each by mouth daily as needed (gas/heartburn).    [provider]  carvedilol (COREG) 6.25 MG tablet Take 1 tablet (6.25 mg total) by mouth 2 (two) times daily. 01/18/22 07/20/23  Tresa Moore, MD  clobetasol cream (TEMOVATE) 0.05 % Apply 1 Application topically 2 (two) times daily as needed. 03/21/23 03/20/24  [provider]  diclofenac Sodium (VOLTAREN) 1 % GEL Apply 1 application topically daily as needed (pain).    [provider]  Dulaglutide 3 MG/0.5ML SOPN Inject 3 mg into the skin every Monday.    [provider]  empagliflozin (JARDIANCE) 25 MG TABS tablet Take 25 mg by mouth daily.    [provider]  furosemide (LASIX) 20 MG tablet Take 1 tablet (20 mg total) by mouth daily. Do not take if your systolic BP is less than 110 01/19/22 07/20/23  Sreenath, Sudheer B, MD  gabapentin (NEURONTIN) 300 MG capsule Take 300 mg by mouth at bedtime.    [provider]  glipiZIDE (GLUCOTROL) 10 MG tablet Take 10 mg by mouth 2 (two) times daily with a meal.     [provider]  hydrALAZINE (APRESOLINE) 25 MG tablet Take 25 mg by mouth 2 (two) times daily.    [provider]  Inulin (FIBER CHOICE PO) Take 5 tablets by mouth with breakfast, with lunch, and with evening meal.    [provider]  ipratropium (ATROVENT) 0.06 % nasal spray Place 2 sprays into both nostrils 4 (four) times daily. 01/24/23   Domenick Gong, MD  isosorbide mononitrate (IMDUR) 60 MG 24 hr tablet Take 60 mg by mouth daily.    [provider]  levocetirizine (XYZAL) 5 MG tablet Take 5 mg by mouth every evening. 11/27/20   [provider]  levothyroxine (SYNTHROID) 75 MCG tablet Take 75 mcg by mouth daily before breakfast.     [provider]  Melatonin 10 MG TABS Take 10 mg by mouth at bedtime as needed (sleep).     [provider]  metFORMIN (GLUCOPHAGE-XR) 500 MG 24 hr tablet Take 1 tablet by mouth 2 (two) times daily. 05/12/23 05/11/24  [provider]  Multiple Vitamins-Minerals (PRESERVISION AREDS 2+MULTI VIT PO) Take 1 capsule by mouth in the morning and at bedtime.    [provider]  omeprazole (PRILOSEC) 40 MG capsule Take 40 mg by mouth daily.     [provider]  oxymetazoline (AFRIN) 0.05 % nasal spray Place 1 spray into both nostrils at bedtime.    [provider]  predniSONE (STERAPRED UNI-PAK 21 TAB) 10 MG (21) TBPK tablet Take by mouth daily. Take 6 tabs by mouth daily for 1, then 5 tabs for 1 day, then 4 tabs for 1 day, then 3 tabs for 1 day, then  2 tabs for 1 day, then 1 tab for 1 day. 09/24/23   Brimage, Seward Meth, DO  rosuvastatin (CRESTOR) 40 MG tablet Take 40 mg by mouth daily.    [provider]  SENNA PLUS 8.6-50 MG tablet Take 2 tablets by mouth 2 (two) times daily.    [provider]  Spacer/Aero-Holding Chambers (AEROCHAMBER MV) inhaler Use as instructed 01/24/23   Domenick Gong, MD  triamcinolone ointment (KENALOG) 0.1 % Apply 1 Application topically 2 (two) times daily. 09/24/23   Katha Cabal, DO  VYNDAMAX 61 MG CAPS Take by mouth. 07/19/22   [provider]    Physical Exam: Vitals:   10/22/23 0534 10/22/23 0559 10/22/23 0600 10/22/23 0800  BP:   (!) 140/80 120/68  Pulse:   84 76  Resp:   19 12  Temp: (!) 97.5 F (36.4 C)     TempSrc: Oral     SpO2:   100% 100%  Weight:  60.8 kg      Constitutional: NAD, calm, comfortable Vitals:   10/22/23 0534 10/22/23 0559 10/22/23 0600 10/22/23 0800  BP:   (!) 140/80 120/68  Pulse:   84 76  Resp:   19 12  Temp: (!) 97.5 F (36.4 C)     TempSrc: Oral     SpO2:   100% 100%  Weight:  60.8 kg     Eyes: PERRL, lids and conjunctivae normal ENMT: Mucous membranes are moist. Posterior pharynx clear of any exudate or lesions.Normal dentition.  Neck: normal,  supple, no masses, no thyromegaly Respiratory: clear to auscultation bilaterally, no wheezing, no crackles. Normal respiratory effort. No accessory muscle use.  Cardiovascular: Regular rate and rhythm, no murmurs / rubs / gallops. No extremity edema. 2+ pedal pulses. No carotid bruits.  Abdomen: no tenderness, no masses palpated. No hepatosplenomegaly. Bowel sounds positive.  Musculoskeletal: no clubbing / cyanosis. No joint deformity upper and lower extremities. Good ROM, no contractures. Normal muscle tone.  Skin: no rashes, lesions, ulcers. No induration Neurologic: CN 2-12 grossly intact. Sensation intact, DTR normal. Strength 5/5 in all 4.  Psychiatric: Normal judgment and insight. Alert and oriented x 3. Normal mood.     Labs on Admission: I have personally reviewed following labs and imaging studies  CBC: Recent Labs  Lab 10/22/23 0410  WBC 20.9*  20.5*  NEUTROABS 8.5*  HGB 15.2  15.2  HCT 47.4  47.4  MCV 108.0*  106.3*  PLT 206  205   Basic Metabolic Panel: Recent Labs  Lab 10/22/23 0410  NA 137  K 4.6  CL 105  CO2 28  GLUCOSE 211*  BUN 23  CREATININE 1.16  CALCIUM 10.5*   GFR: Estimated Creatinine Clearance: 39.3 mL/min (by C-G formula based on SCr of 1.16 mg/dL). Liver Function Tests: Recent Labs  Lab 10/22/23 0410  AST 24  ALT 22  ALKPHOS 75  BILITOT 0.6  PROT 7.5  ALBUMIN 4.0   Recent Labs  Lab 10/22/23 0410  LIPASE 20   No results for input(s): "AMMONIA" in the last 168 hours. Coagulation Profile: Recent Labs  Lab 10/22/23 0626  INR 1.2   Cardiac Enzymes: No results for input(s): "CKTOTAL", "CKMB", "CKMBINDEX", "TROPONINI" in the last 168 hours. BNP (last 3 results) No results for input(s): "PROBNP" in the last 8760 hours. HbA1C: No results for input(s): "HGBA1C" in the last 72 hours. CBG: Recent Labs  Lab 10/22/23 0637  GLUCAP 123*   Lipid Profile: No results for input(s): "CHOL", "HDL", "LDLCALC", "TRIG", "CHOLHDL",  "  LDLDIRECT" in the last 72 hours. Thyroid Function Tests: No results for input(s): "TSH", "T4TOTAL", "FREET4", "T3FREE", "THYROIDAB" in the last 72 hours. Anemia Panel: No results for input(s): "VITAMINB12", "FOLATE", "FERRITIN", "TIBC", "IRON", "RETICCTPCT" in the last 72 hours. Urine analysis:    Component Value Date/Time   COLORURINE YELLOW (A) 07/20/2023 1522   APPEARANCEUR CLEAR (A) 07/20/2023 1522   LABSPEC 1.021 07/20/2023 1522   PHURINE 5.0 07/20/2023 1522   GLUCOSEU >=500 (A) 07/20/2023 1522   HGBUR NEGATIVE 07/20/2023 1522   BILIRUBINUR NEGATIVE 07/20/2023 1522   KETONESUR NEGATIVE 07/20/2023 1522   PROTEINUR NEGATIVE 07/20/2023 1522   NITRITE NEGATIVE 07/20/2023 1522   LEUKOCYTESUR NEGATIVE 07/20/2023 1522    Radiological Exams on Admission: CT ABDOMEN PELVIS WO CONTRAST Result Date: 10/22/2023 CLINICAL DATA:  Abdominal bloating. EXAM: CT ABDOMEN AND PELVIS WITHOUT CONTRAST TECHNIQUE: Multidetector CT imaging of the abdomen and pelvis was performed following the standard protocol without IV contrast. RADIATION DOSE REDUCTION: This exam was performed according to the departmental dose-optimization program which includes automated exposure control, adjustment of the mA and/or kV according to patient size and/or use of iterative reconstruction technique. COMPARISON:  12/02/2020. FINDINGS: Lower chest: Dependent atelectasis in the lung bases. Hepatobiliary: Tiny hypodensity in the dome of the left liver is stable consistent with benign etiology such as a cyst. No followup imaging is recommended. There is no evidence for gallstones, gallbladder wall thickening, or pericholecystic fluid. No intrahepatic or extrahepatic biliary dilation. Pancreas: Pancreas is diffusely atrophic. 1.8 x 1.6 cm soft tissue nodule in the uncinate process is stable since a 12/02/2020 exam at which time it showed avid arterial phase enhancement. Spleen: Splenectomy. Adrenals/Urinary Tract: No adrenal nodule or  mass. Several punctate nonobstructing stones are seen in the right kidney 8 mm hyperattenuating subcapsular lesion anterior interpolar right kidney is too small to characterize but likely a cyst complicated by proteinaceous debris or hemorrhage. No followup imaging is recommended. Left kidney surgically absent. No right hydroureter. Bladder is nondistended and partially obscured by beam hardening artifact from right hip replacement. Stomach/Bowel: Stomach is distended with gas and food. Duodenum is normally positioned as is the ligament of Treitz. Small bowel loops in the left abdomen are dilated up to 2.7 cm diameter in a similar distribution to the previous study. Small bowel loops in the pelvis are nondilated. Patient is status post subtotal colectomy with diverticular disease noted in the distal left colon. Fluid in the rectum can be seen in the setting of diarrhea. Vascular/Lymphatic: There is moderate atherosclerotic calcification of the abdominal aorta without aneurysm. There is no gastrohepatic or hepatoduodenal ligament lymphadenopathy. No retroperitoneal or mesenteric lymphadenopathy. No pelvic sidewall lymphadenopathy. Reproductive: Prostate gland is enlarged. Other: No intraperitoneal free fluid. Musculoskeletal: Status post right total hip replacement. Degenerative disc disease noted lower lumbar spine. IMPRESSION: 1. Distended small bowel loops in the left abdomen in a similar distribution to the previous study although the degree of small-bowel distention is less than on the prior study. Small bowel loops in the pelvis are nondilated. Imaging features are nonspecific but could reflect underlying small bowel stricture. 2. 1.8 x 1.6 cm soft tissue nodule in the uncinate process of the pancreas is stable since a 12/02/2020 exam at which time it showed avid arterial phase enhancement. This same lesion was present on a noncontrast CT of 10/12/2019 and measured 3.3 x 2.8 cm at that time. Previous exam raise  the question of metastatic deposit from renal cell carcinoma. 3. Status post subtotal colectomy with diverticular  disease in the distal left colon. 4. Punctate nonobstructing right renal stones. 5. Prostatomegaly. 6.  Aortic Atherosclerois (ICD10-170.0) Electronically Signed   By: Kennith Center M.D.   On: 10/22/2023 06:17   DG Abdomen Acute W/Chest Result Date: 10/22/2023 CLINICAL DATA:  89 year old male with gas and bloating. EXAM: DG ABDOMEN ACUTE WITH 1 VIEW CHEST COMPARISON:  Chest radiographs 07/20/2023. Abdominal radiographs 12/04/2020 and earlier. FINDINGS: PA view of the chest. Chronic CABG. Stable cardiac size and mediastinal contours. Visualized tracheal air column is within normal limits. Lung volumes remain within normal limits. The lungs appear stable, clear. No pneumothorax or pneumoperitoneum. Upright and supine views of the abdomen and pelvis. Chronic right hip arthroplasty. Extensive chronic surgical clips along the abdominal aorta in the abdomen, visible on prior 2022 CTA. Superimposed numerous small bowel air-fluid levels throughout the left abdomen, although mostly nondilated small bowel there on the supine views. More featureless appearing mid abdominal bowel loops. No pneumoperitoneum. No acute osseous abnormality identified. IMPRESSION: 1. Bowel-gas pattern compatible with Partial or Early Small Bowel Obstruction. No pneumoperitoneum. 2.  No acute cardiopulmonary abnormality. Electronically Signed   By: Odessa Fleming M.D.   On: 10/22/2023 05:03    EKG: Independently reviewed.  Sinus rhythm with some PVCs, chronic ST changes probably secondary to LVH on chest leads.  Assessment/Plan Principal Problem:   NSTEMI (non-ST elevated myocardial infarction) [facility]) Active Problems:   Ileus (HCC)  (please populate well all problems here in Problem List. (For example, if patient is on BP meds at home and you resume or decide to hold them, it is a problem that needs to be her. Same for CAD, COPD, HLD  and so on)  Acute enteritis -Viral versus food toxin, appears to be self-limiting, start diet -CT image reassuring, might have ileus before significant diarrhea episode.  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  Elevated troponins NSTEMI -Discussed with on-call [facility] cardiology, who reviewed the recent stress test, and for now impression is demanding ischemia, low suspicion for ACS.  Cardiology agreed with echocardiogram, if echo is negative, likely heparin drip can be discontinued.  Chronic constipation -Discussed with patient regarding usage of fiber pills versus other anticonstipation medications.  CAD Chronic cardiomyopathy Cardiac amyloidosis, question of Chronic HFrEF HTN -No chest pain, euvolemic, no acute concerns. -Continue amlodipine and ARB, and hydralazine and Imdur  IIDM, hyperglycemia -SSI -Hold off metformin and Jardiance  CLL MGUS Hairy cell leukemia Chronic leukocytosis -Chronic elevated leukocytosis with normal differential, pathology smear pending.  Overall no acute concerns.  DVT prophylaxis: Heparin drip Code Status: Full code Family Communication: Wife and daughter at bedside Disposition Plan: Expect less than 2 midnight hospital stay Consults called: Curbside consult with [facility] cardiology Admission status: Tele Observation   Emeline General MD [facility] Pager [phone]  10/22/2023, 9:49 AM

## 2023-10-22 NOTE — Progress Notes (Signed)
 ANTICOAGULATION CONSULT NOTE  Pharmacy Consult for heparin infusion Indication: ACS / STEMI  Allergies  Allergen Reactions   Levofloxacin Hives and Other (See Comments)   Levaquin [Levofloxacin In D5w]     Weakness, low blood pressure   Januvia [Sitagliptin] Rash    Patient Measurements: Weight: 60.8 kg (134 lb 0.6 oz) (From O/V note from 10/17/23) Heparin Dosing Weight: 60.8 kg  Vital Signs: Temp: 97.5 F (36.4 C) (03/19 0534) Temp Source: Oral (03/19 0534) BP: 152/97 (03/19 0353) Pulse Rate: 93 (03/19 0353)  Labs: Recent Labs    10/22/23 0410  HGB 15.2  HCT 47.4  PLT 205  CREATININE 1.16  TROPONINIHS 108*    Estimated Creatinine Clearance: 39.3 mL/min (by C-G formula based on SCr of 1.16 mg/dL).   Medical History: Past Medical History:  Diagnosis Date   Cancer (HCC)    kidney   Cancer (HCC)    pancreas   Cancer (HCC)    colon    Diabetes mellitus without complication (HCC)    Hyperlipidemia    Hypertension    Myocardial infarction (HCC)    Streptococcal infection    Strep Bovis   Thyroid disease     Assessment: Pt is a 89 yo male presenting to ED c/o c/o gas/bloating, found with elevated troponin I level.  Goal of Therapy:  Heparin level 0.3-0.7 units/ml Monitor platelets by anticoagulation protocol: Yes   Plan:  Bolus 3600 units x 1 Start heparin infusion at 800 units/hr Will check HL in 8 hr after start of infusion CBC daily while on heparin  Otelia Sergeant, PharmD, [facility] 10/22/2023 6:10 AM

## 2023-10-22 NOTE — Progress Notes (Signed)
*  PRELIMINARY RESULTS* Echocardiogram 2D Echocardiogram has been performed.  Daniel Bray 10/22/2023, 4:46 PM

## 2023-10-22 NOTE — Progress Notes (Signed)
 ANTICOAGULATION CONSULT NOTE  Pharmacy Consult for heparin infusion Indication: ACS / STEMI  Allergies  Allergen Reactions   Levofloxacin Hives and Other (See Comments)   Levaquin [Levofloxacin In D5w]     Weakness, low blood pressure   Januvia [Sitagliptin] Rash    Patient Measurements: Weight: 60.8 kg (134 lb 0.6 oz) (From O/V note from 10/17/23) Heparin Dosing Weight: 60.8 kg  Vital Signs: Temp: 97.7 F (36.5 C) (03/19 1154) Temp Source: Oral (03/19 1154) BP: 121/72 (03/19 1400) Pulse Rate: 78 (03/19 1400)  Labs: Recent Labs    10/22/23 0410 10/22/23 0535 10/22/23 0626 10/22/23 1519  HGB 15.2  15.2  --   --   --   HCT 47.4  47.4  --   --   --   PLT 206  205  --   --   --   APTT  --   --  31  --   LABPROT  --   --  14.9  --   INR  --   --  1.2  --   HEPARINUNFRC  --   --   --  0.41  CREATININE 1.16  --   --   --   TROPONINIHS 108* 96*  --   --     Estimated Creatinine Clearance: 39.3 mL/min (by C-G formula based on SCr of 1.16 mg/dL).   Medical History: Past Medical History:  Diagnosis Date   Cancer (HCC)    kidney   Cancer (HCC)    pancreas   Cancer (HCC)    colon    Diabetes mellitus without complication (HCC)    Hyperlipidemia    Hypertension    Myocardial infarction (HCC)    Streptococcal infection    Strep Bovis   Thyroid disease     Assessment: Pt is a 89 yo male presenting to ED c/o c/o gas/bloating, found with elevated troponin I level.  Date/Time HL Rate  Comment 3/19 1519 0.41 800 units/hr Therapeutic x 1  Goal of Therapy:  Heparin level 0.3-0.7 units/ml Monitor platelets by anticoagulation protocol: Yes   Plan:  Heparin level therapeutic x 1 Continue heparin infusion at 800 units/hr Check confirmatory HL in 8 hr CBC daily while on heparin  Paulita Fujita, PharmD Clinical Pharmacist  10/22/2023 3:45 PM

## 2023-10-23 DIAGNOSIS — I214 Non-ST elevation (NSTEMI) myocardial infarction: Secondary | ICD-10-CM | POA: Diagnosis not present

## 2023-10-23 LAB — CBC
HCT: 40.3 % (ref 39.0–52.0)
Hemoglobin: 13.3 g/dL (ref 13.0–17.0)
MCH: 34.5 pg — ABNORMAL HIGH (ref 26.0–34.0)
MCHC: 33 g/dL (ref 30.0–36.0)
MCV: 104.7 fL — ABNORMAL HIGH (ref 80.0–100.0)
Platelets: 182 10*3/uL (ref 150–400)
RBC: 3.85 MIL/uL — ABNORMAL LOW (ref 4.22–5.81)
RDW: 13 % (ref 11.5–15.5)
WBC: 17 10*3/uL — ABNORMAL HIGH (ref 4.0–10.5)
nRBC: 0 % (ref 0.0–0.2)

## 2023-10-23 LAB — ECHOCARDIOGRAM COMPLETE
AR max vel: 3.17 cm2
AV Area VTI: 2.69 cm2
AV Area mean vel: 2.62 cm2
AV Mean grad: 2 mmHg
AV Peak grad: 3.4 mmHg
Ao pk vel: 0.92 m/s
Area-P 1/2: 6.12 cm2
Calc EF: 27.3 %
MV VTI: 1.92 cm2
S' Lateral: 4 cm
Single Plane A2C EF: 29.8 %
Single Plane A4C EF: 29.1 %
Weight: 2144.63 [oz_av]

## 2023-10-23 LAB — HEPARIN LEVEL (UNFRACTIONATED): Heparin Unfractionated: 0.43 [IU]/mL (ref 0.30–0.70)

## 2023-10-23 LAB — CBG MONITORING, ED
Glucose-Capillary: 111 mg/dL — ABNORMAL HIGH (ref 70–99)
Glucose-Capillary: 217 mg/dL — ABNORMAL HIGH (ref 70–99)

## 2023-10-23 LAB — TROPONIN I (HIGH SENSITIVITY): Troponin I (High Sensitivity): 83 ng/L — ABNORMAL HIGH (ref <18)

## 2023-10-23 NOTE — Progress Notes (Signed)
 ANTICOAGULATION CONSULT NOTE  Pharmacy Consult for heparin infusion Indication: ACS / STEMI  Allergies  Allergen Reactions   Levofloxacin Hives and Other (See Comments)   Levaquin [Levofloxacin In D5w]     Weakness, low blood pressure   Januvia [Sitagliptin] Rash    Patient Measurements: Weight: 60.8 kg (134 lb 0.6 oz) (From O/V note from 10/17/23) Heparin Dosing Weight: 60.8 kg  Vital Signs: Temp: 98.1 F (36.7 C) (03/20 0026) Temp Source: Oral (03/20 0026) BP: 126/85 (03/20 0026) Pulse Rate: 83 (03/20 0026)  Labs: Recent Labs    10/22/23 0410 10/22/23 0535 10/22/23 0626 10/22/23 1519 10/23/23 0105  HGB 15.2  15.2  --   --   --   --   HCT 47.4  47.4  --   --   --   --   PLT 206  205  --   --   --   --   APTT  --   --  31  --   --   LABPROT  --   --  14.9  --   --   INR  --   --  1.2  --   --   HEPARINUNFRC  --   --   --  0.41 0.43  CREATININE 1.16  --   --   --   --   TROPONINIHS 108* 96*  --   --   --     Estimated Creatinine Clearance: 39.3 mL/min (by C-G formula based on SCr of 1.16 mg/dL).   Medical History: Past Medical History:  Diagnosis Date   Cancer (HCC)    kidney   Cancer (HCC)    pancreas   Cancer (HCC)    colon    Diabetes mellitus without complication (HCC)    Hyperlipidemia    Hypertension    Myocardial infarction (HCC)    Streptococcal infection    Strep Bovis   Thyroid disease     Assessment: Pt is a 89 yo male presenting to ED c/o c/o gas/bloating, found with elevated troponin I level.  Date/Time HL Rate  Comment 3/19 1519 0.41 800 units/hr Therapeutic x 1 3/20 0105 0.43 800 units/hr Therapeutic x 2  Goal of Therapy:  Heparin level 0.3-0.7 units/ml Monitor platelets by anticoagulation protocol: Yes   Plan:  Heparin level therapeutic x 2 Continue heparin infusion at 800 units/hr Recheck HL daily w/ AM labs while therapeutic CBC daily while on heparin  Otelia Sergeant, PharmD, [facility] 10/23/2023 1:54 AM

## 2023-10-23 NOTE — Care Management Obs Status (Signed)
 MEDICARE OBSERVATION STATUS NOTIFICATION   Patient Details  Name: Daniel Bray MRN: [id number] Date of Birth: 1935/02/01   Medicare Observation Status Notification Given:  Orland Dec, CMA 10/23/2023, 9:49 AM

## 2023-10-23 NOTE — ED Notes (Signed)
 Sent a secure chat to Elwin Mocha MD regarding pt status and questions regarding the echo/dispo. Message entails "question about the Echo. it was done at like yesterday, I was under the impression we dc or keep on heparin based on the report? how long does that normally take for reports to get back? the fam would love to see a doc. They were told Card was being consulted but say they never saw one and feel like they feel confused"   Initial response "I will address all this on rounds thanks, he's new to me today"  Passed this along to family who was not happy but fairly pleasant. Sister was talked to in length and kept updated but switched out with brother who came in with immediate confrontation regarding the doctors not being by in 24 hrs (daily rounding had been explained) and complaining about everything. Charge was notified to see if primary RN could help assistance with situation.

## 2023-10-23 NOTE — ED Notes (Signed)
 Blood sugar 217. Daniel Bray has been notified of glucometer problems.

## 2023-10-23 NOTE — Evaluation (Signed)
 Physical Therapy Evaluation Patient Details Name: KAVI ALMQUIST MRN: [id number] DOB: [birth year] Today's Date: 10/23/2023  History of Present Illness  Pt is an 89 y.o. male presenting to hospital 10/22/23 with c/o 1 day h/o abdominal bloating, gassiness, and nausea.  Pt admitted with acute enteritis, elevated troponins, chronic constipation.  PMH includes kidney CA, pancreas CA, colon CA, DM, htn, MI, NSTEMI, diabetic polyneuropathy, bradycardia, CABG x3, CLL, MGUS, hairy cell leukemia.  Clinical Impression  Prior to recent medical concerns, pt reports being independent with functional mobility; lives with his wife in 1 level home with ramp to enter; active in general.  No c/o pain during session (pt reports he does have pain intermittently in L shoulder with use; no longer going to OP PT for it).  Currently pt is independent with bed mobility, transfer from bed, and ambulation in hallway (no AD use).  No balance impairments noted.  Pt appears to be at baseline level of functional mobility per pt description.  No acute PT needs identified; will sign off; discussed with pt who was in agreement; MD updated.    If plan is discharge home, recommend the following:     Can travel by private vehicle    Yes    Equipment Recommendations None recommended by PT  Recommendations for Other Services       Functional Status Assessment Patient has not had a recent decline in their functional status     Precautions / Restrictions Precautions Precautions: None Recall of Precautions/Restrictions: Intact Restrictions Weight Bearing Restrictions Per Provider Order: No      Mobility  Bed Mobility Overal bed mobility: Independent             General bed mobility comments: No difficulties noted supine to/from sitting.    Transfers Overall transfer level: Independent Equipment used: None               General transfer comment: steady transfer from bed     Ambulation/Gait Ambulation/Gait assistance: Independent Gait Distance (Feet):  (approximately 200 feet in hallway) Assistive device: None Gait Pattern/deviations: Step-through pattern Gait velocity: slightly decreased     General Gait Details: steady ambulation  Stairs            Wheelchair Mobility     Tilt Bed    Modified Rankin (Stroke Patients Only)       Balance Overall balance assessment: Independent Sitting-balance support: No upper extremity supported, Feet supported Sitting balance-Leahy Scale: Normal Sitting balance - Comments: steady reaching outside BOS   Standing balance support: No upper extremity supported, During functional activity Standing balance-Leahy Scale: Normal Standing balance comment: no loss of balance with dynamic standing activities/walking                             Pertinent Vitals/Pain Pain Assessment Pain Assessment: No/denies pain HR 82-95 bpm and SpO2 sats 94% or greater on room air during sessions activities.    Home Living Family/patient expects to be discharged to:: Private residence Living Arrangements: Spouse/significant other Available Help at Discharge: Family;Available 24 hours/day Type of Home: House Home Access: Ramped entrance       Home Layout: One level Home Equipment: Grab bars - tub/shower      Prior Function Prior Level of Function : Independent/Modified Independent             Mobility Comments: No recent falls reported.  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  Extremity/Trunk Assessment   Upper Extremity Assessment Upper Extremity Assessment: Overall WFL for tasks assessed    Lower Extremity Assessment Lower Extremity Assessment: Overall WFL for tasks assessed    Cervical / Trunk Assessment Cervical / Trunk Assessment: Normal  Communication   Communication Communication: No apparent difficulties    Cognition Arousal: Alert Behavior During Therapy: WFL for  tasks assessed/performed   PT - Cognitive impairments: No apparent impairments                         Following commands: Intact       Cueing Cueing Techniques: Verbal cues     General Comments General comments (skin integrity, edema, etc.): bruise noted L lateral upper arm (distal to shoulder)--pt's wife reported she noticed it yesterday in the ED    Exercises     Assessment/Plan    PT Assessment Patient does not need any further PT services  PT Problem List         PT Treatment Interventions      PT Goals (Current goals can be found in the Care Plan section)  Acute Rehab PT Goals Patient Stated Goal: to go home PT Goal Formulation: With patient Time For Goal Achievement: 11/06/23 Potential to Achieve Goals: Good    Frequency       Co-evaluation               AM-PAC PT "6 Clicks" Mobility  Outcome Measure Help needed turning from your back to your side while in a flat bed without using bedrails?: None Help needed moving from lying on your back to sitting on the side of a flat bed without using bedrails?: None Help needed moving to and from a bed to a chair (including a wheelchair)?: None Help needed standing up from a chair using your arms (e.g., wheelchair or bedside chair)?: None Help needed to walk in hospital room?: None Help needed climbing 3-5 steps with a railing? : None 6 Click Score: 24    End of Session   Activity Tolerance: Patient tolerated treatment well Patient left: in bed;with call bell/phone within reach;with family/visitor present Nurse Communication: Mobility status;Precautions PT Visit Diagnosis: Other (comment);Muscle weakness (generalized) (M62.81)    Time: 8756-4332 PT Time Calculation (min) (ACUTE ONLY): 23 min   Charges:   PT Evaluation $PT Eval Low Complexity: 1 Low   PT General Charges $$ ACUTE PT VISIT: 1 Visit        Hendricks Limes, PT 10/23/23, 11:39 AM

## 2023-10-23 NOTE — Discharge Summary (Signed)
 Physician Discharge Summary   Patient: Daniel Bray MRN: [id number]  DOB: 04-21-1935   Admit:     Date of Admission: 10/22/2023 Admitted from: home   Discharge: Date of discharge: 10/23/23 Disposition: Home Condition at discharge: good  CODE STATUS: FULL CODE     Discharge Physician: Sunnie Nielsen, DO [facility]     PCP: [facility]  Recommendations for Outpatient Follow-up:  Follow up with PCP [place] in 1-2 weeks - address chronic constipation Keep appointment tomorrow w/ Dr Darrold Junker cardiology    Discharge Instructions     (HEART FAILURE PATIENTS) Call MD:  Anytime you have any of the following symptoms: 1) 3 pound weight gain in 24 hours or 5 pounds in 1 week 2) shortness of breath, with or without a dry hacking cough 3) swelling in the hands, feet or stomach 4) if you have to sleep on extra pillows at night in order to breathe.   Complete by: As directed    Diet - low sodium heart healthy   Complete by: As directed    Increase activity slowly   Complete by: As directed          Discharge Diagnoses: Principal Problem:   NSTEMI (non-ST elevated myocardial infarction) [facility]) Active Problems:   Ileus [facility])       Hospital course / significant events:   Daniel Bray is a 89 y.o. male with medical history significant of CAD status post CABG and PCI, HTN, HLD, chronic HFrEF with LVEF 25-35%, cardiac amyloidosis, CLL, MGUS, hairy cell leukemia, renal cancer status post left nephrectomy, CKD stage IIIa, history of pancreatic cancer status post resection, history of SBO, IIDM, presented with GI symptoms including " feeling bloated" and diarrhea. Patient has chronic constipation, but he has a poor tolerance to stool softener and laxative, as a result he has been mainly taking fibers for his constipation, family reported that he takes up to 20 pills of fibers every day.  Last night patient ate some cheese  sandwich and nuts for dinner and soon started develop large amount of continuous gas passing and feeling of bloating but denied any nauseous vomiting or abdominal pain.  No fever or chills.  Overnight patient passed to watery diarrhea and 1 loose bowel movement and feeling the bloating problems improved.  He denied any chest pain shortness of breath.   In ED, Afebrile, not tachycardia nonhypotensive nonhypoxic.  Blood work showed chronic leukocytosis WBC 20.5 compared to baseline 20-25, hemoglobin 15.2 BUN 23 creatinine 1.1 bicarb 28, glucose 221.  Troponin 108> 96, was started on heparin.  CT abdomen pelvis showed distended small bowel loops appear to be nonspecific and low suspicion for SBO. Echo done. Overnight, no concerns on telemetry, pt having baseline bowel movements. Troponin continued trend down and no chest pain. Echo read by cardiology, I spoke w/ Dr Darrold Junker, who recommends pt can follow w/ him in the office as scheduled tomorrow      Consultants:  Informal discussion w/ cardiology but no formal consult  Procedures/Surgeries: none      ASSESSMENT & PLAN:   Acute enteritis Chronic constipation  Viral versus food toxin, appears to be self-limiting, start diet CT image reassuring, might have ileus before significant diarrhea episode.  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 Follow w/ PCP   Elevated troponins Ruled out ACS  -Discussed with on-call [facility] cardiology Dr Darrold Junker - admitting hospitalist  d/w him yesterday and I also touched base with him today prior to discharge. He reviewed the recent stress test, and for now impression is demanding ischemia, low suspicion for ACS, agreed with echocardiogram, which was at baseline and pt ok for follow up in office    CAD Chronic cardiomyopathy Cardiac amyloidosis, question of Chronic HFrEF HTN No chest pain, euvolemic, no acute concerns. Continue home  medications Close cardiology follow up    IIDM, hyperglycemia Restart home medications Follow up with PCP   CLL MGUS Hairy cell leukemia Chronic leukocytosis Chronic elevated leukocytosis Follow outaptient                Discharge Instructions  Allergies as of 10/23/2023       Reactions   Levofloxacin Hives, Other (See Comments)   Levaquin [levofloxacin In D5w]    Weakness, low blood pressure   Januvia [sitagliptin] Rash        Medication List     STOP taking these medications    ipratropium 0.06 % nasal spray Commonly known as: ATROVENT   oxymetazoline 0.05 % nasal spray Commonly known as: AFRIN   predniSONE 10 MG (21) Tbpk tablet Commonly known as: STERAPRED UNI-PAK 21 TAB       TAKE these medications    acetaminophen 650 MG CR tablet Commonly known as: TYLENOL Take 1,300 mg by mouth every 8 (eight) hours as needed for pain.   AeroChamber MV inhaler Use as instructed   albuterol 108 (90 Base) MCG/ACT inhaler Commonly known as: VENTOLIN HFA Inhale 2 puffs into the lungs every 2 (two) hours as needed for wheezing or shortness of breath.   Alka-Seltzer Heartburn + Gas 750-80 MG Chew Generic drug: Calcium Carbonate-Simethicone Chew 2 each by mouth daily as needed (gas/heartburn).   aspirin EC 81 MG tablet Take 81 mg by mouth daily.   clobetasol cream 0.05 % Commonly known as: TEMOVATE Apply 1 Application topically 2 (two) times daily as needed.   diclofenac Sodium 1 % Gel Commonly known as: VOLTAREN Apply 1 application topically daily as needed (pain).   empagliflozin 25 MG Tabs tablet Commonly known as: JARDIANCE Take 25 mg by mouth daily.   FIBER CHOICE PO Take 5 tablets by mouth with breakfast, with lunch, and with evening meal.   furosemide 20 MG tablet Commonly known as: LASIX Take 1 tablet (20 mg total) by mouth daily. Do not take if your systolic BP is less than 110   gabapentin 300 MG capsule Commonly known as:  NEURONTIN Take 300 mg by mouth at bedtime.   glipiZIDE 10 MG tablet Commonly known as: GLUCOTROL Take 10 mg by mouth 2 (two) times daily with a meal.   isosorbide mononitrate 60 MG 24 hr tablet Commonly known as: IMDUR Take 60 mg by mouth daily.   levocetirizine 5 MG tablet Commonly known as: XYZAL Take 5 mg by mouth every evening.   levothyroxine 75 MCG tablet Commonly known as: SYNTHROID Take 75 mcg by mouth daily before breakfast.   Melatonin 10 MG Tabs Take 10 mg by mouth at bedtime as needed (sleep).   metFORMIN 500 MG 24 hr tablet Commonly known as: GLUCOPHAGE-XR Take 1 tablet by mouth 2 (two) times daily.   omeprazole 40 MG capsule Commonly known as: PRILOSEC Take 40 mg by mouth daily.   PRESERVISION AREDS 2+MULTI VIT PO Take 1 capsule by mouth in the morning and at bedtime.   rosuvastatin 20 MG tablet Commonly known as: CRESTOR Take 20 mg by  mouth daily.   Senna Plus 8.6-50 MG tablet Generic drug: senna-docusate Take 2 tablets by mouth 2 (two) times daily.   triamcinolone ointment 0.1 % Commonly known as: KENALOG Apply 1 Application topically 2 (two) times daily.   Vyndamax 61 MG Caps Generic drug: Tafamidis Take 61 mg by mouth daily.         Follow-up Information     Paraschos, Lyn Hollingshead, MD. Go to.   Specialty: Cardiology Why: keep scheduled appointmetn for tomorrow Contact information: [address] [facility] [place] [address] [phone]                 Allergies  Allergen Reactions   Levofloxacin Hives and Other (See Comments)   Levaquin [Levofloxacin In D5w]     Weakness, low blood pressure   Januvia [Sitagliptin] Rash     Subjective: pt feeling well this morning, reports ambulating without chest pain, dizziness, SOB. Reports BM at baseline.    Discharge Exam: BP 123/71   Pulse 76   Temp 97.6 F (36.4 C) (Oral)   Resp 13   Wt 60.8 kg Comment: From O/V note from 10/17/23  SpO2 96%    BMI 19.23 kg/m  General: Pt is alert, awake, not in acute distress Cardiovascular: RRR, S1/S2, no rubs, no gallops Respiratory: CTA bilaterally, no wheezing, no rhonchi, no rales  Abdominal: Soft, NT, ND Extremities: no edema, no cyanosis     The results of significant diagnostics from this hospitalization (including imaging, microbiology, ancillary and laboratory) are listed below for reference.     Microbiology: Recent Results (from the past 240 hours)  Gastrointestinal Panel by PCR , Stool     Status: None   Collection Time: 10/22/23  1:11 PM   Specimen: Stool  Result Value Ref Range Status   Campylobacter species NOT DETECTED NOT DETECTED Final   Plesimonas shigelloides NOT DETECTED NOT DETECTED Final   Salmonella species NOT DETECTED NOT DETECTED Final   Yersinia enterocolitica NOT DETECTED NOT DETECTED Final   Vibrio species NOT DETECTED NOT DETECTED Final   Vibrio cholerae NOT DETECTED NOT DETECTED Final   Enteroaggregative E coli (EAEC) NOT DETECTED NOT DETECTED Final   Enteropathogenic E coli (EPEC) NOT DETECTED NOT DETECTED Final   Enterotoxigenic E coli (ETEC) NOT DETECTED NOT DETECTED Final   Shiga like toxin producing E coli (STEC) NOT DETECTED NOT DETECTED Final   Shigella/Enteroinvasive E coli (EIEC) NOT DETECTED NOT DETECTED Final   Cryptosporidium NOT DETECTED NOT DETECTED Final   Cyclospora cayetanensis NOT DETECTED NOT DETECTED Final   Entamoeba histolytica NOT DETECTED NOT DETECTED Final   Giardia lamblia NOT DETECTED NOT DETECTED Final   Adenovirus F40/41 NOT DETECTED NOT DETECTED Final   Astrovirus NOT DETECTED NOT DETECTED Final   Norovirus GI/GII NOT DETECTED NOT DETECTED Final   Rotavirus A NOT DETECTED NOT DETECTED Final   Sapovirus (I, II, IV, and V) NOT DETECTED NOT DETECTED Final    Comment: Performed at [facility], [address] [place], [place] [address]     Labs: BNP (last 3 results) No results for input(s): "BNP" in the  last 8760 hours. Basic Metabolic Panel: Recent Labs  Lab 10/22/23 0410  NA 137  K 4.6  CL 105  CO2 28  GLUCOSE 211*  BUN 23  CREATININE 1.16  CALCIUM 10.5*   Liver Function Tests: Recent Labs  Lab 10/22/23 0410  AST 24  ALT 22  ALKPHOS 75  BILITOT 0.6  PROT 7.5  ALBUMIN 4.0   Recent Labs  Lab 10/22/23 0410  LIPASE 20   No results for input(s): "AMMONIA" in the last 168 hours. CBC: Recent Labs  Lab 10/22/23 0410 10/23/23 0629  WBC 20.9*  20.5* 17.0*  NEUTROABS 8.5*  --   HGB 15.2  15.2 13.3  HCT 47.4  47.4 40.3  MCV 108.0*  106.3* 104.7*  PLT 206  205 182   Cardiac Enzymes: No results for input(s): "CKTOTAL", "CKMB", "CKMBINDEX", "TROPONINI" in the last 168 hours. BNP: Invalid input(s): "POCBNP" CBG: Recent Labs  Lab 10/22/23 0637 10/22/23 1200 10/22/23 1651 10/23/23 0806 10/23/23 1153  GLUCAP 123* 102* 173* 111* 217*   D-Dimer No results for input(s): "DDIMER" in the last 72 hours. Hgb A1c No results for input(s): "HGBA1C" in the last 72 hours. Lipid Profile No results for input(s): "CHOL", "HDL", "LDLCALC", "TRIG", "CHOLHDL", "LDLDIRECT" in the last 72 hours. Thyroid function studies No results for input(s): "TSH", "T4TOTAL", "T3FREE", "THYROIDAB" in the last 72 hours.  Invalid input(s): "FREET3" Anemia work up No results for input(s): "VITAMINB12", "FOLATE", "FERRITIN", "TIBC", "IRON", "RETICCTPCT" in the last 72 hours. Urinalysis    Component Value Date/Time   COLORURINE YELLOW (A) 10/22/2023 1311   APPEARANCEUR CLEAR (A) 10/22/2023 1311   LABSPEC 1.029 10/22/2023 1311   PHURINE 5.0 10/22/2023 1311   GLUCOSEU 150 (A) 10/22/2023 1311   HGBUR NEGATIVE 10/22/2023 1311   BILIRUBINUR NEGATIVE 10/22/2023 1311   KETONESUR NEGATIVE 10/22/2023 1311   PROTEINUR NEGATIVE 10/22/2023 1311   NITRITE NEGATIVE 10/22/2023 1311   LEUKOCYTESUR NEGATIVE 10/22/2023 1311   Sepsis Labs Recent Labs  Lab 10/22/23 0410 10/23/23 0629  WBC 20.9*   20.5* 17.0*   Microbiology Recent Results (from the past 240 hours)  Gastrointestinal Panel by PCR , Stool     Status: None   Collection Time: 10/22/23  1:11 PM   Specimen: Stool  Result Value Ref Range Status   Campylobacter species NOT DETECTED NOT DETECTED Final   Plesimonas shigelloides NOT DETECTED NOT DETECTED Final   Salmonella species NOT DETECTED NOT DETECTED Final   Yersinia enterocolitica NOT DETECTED NOT DETECTED Final   Vibrio species NOT DETECTED NOT DETECTED Final   Vibrio cholerae NOT DETECTED NOT DETECTED Final   Enteroaggregative E coli (EAEC) NOT DETECTED NOT DETECTED Final   Enteropathogenic E coli (EPEC) NOT DETECTED NOT DETECTED Final   Enterotoxigenic E coli (ETEC) NOT DETECTED NOT DETECTED Final   Shiga like toxin producing E coli (STEC) NOT DETECTED NOT DETECTED Final   Shigella/Enteroinvasive E coli (EIEC) NOT DETECTED NOT DETECTED Final   Cryptosporidium NOT DETECTED NOT DETECTED Final   Cyclospora cayetanensis NOT DETECTED NOT DETECTED Final   Entamoeba histolytica NOT DETECTED NOT DETECTED Final   Giardia lamblia NOT DETECTED NOT DETECTED Final   Adenovirus F40/41 NOT DETECTED NOT DETECTED Final   Astrovirus NOT DETECTED NOT DETECTED Final   Norovirus GI/GII NOT DETECTED NOT DETECTED Final   Rotavirus A NOT DETECTED NOT DETECTED Final   Sapovirus (I, II, IV, and V) NOT DETECTED NOT DETECTED Final    Comment: Performed at [facility], [address], [place], [place] [address]   Imaging ECHOCARDIOGRAM COMPLETE Result Date: 10/23/2023    ECHOCARDIOGRAM REPORT   Patient Name:   Daniel Bray Date of Exam: 10/22/2023 Medical Rec #:  [id number]          Height:       70.0 in Accession #:    [id number]  Weight:       134.0 lb Date of Birth:  [birth year]         BSA:          1.761 m Patient Age:    88 years           BP:           121/72 mmHg Patient Gender: M                  HR:           85 bpm. Exam Location:  [facility] Procedure: 2D Echo, 3D  Echo, Cardiac Doppler, Color Doppler and Strain Analysis            (Both Spectral and Color Flow Doppler were utilized during            procedure). Indications:     Elevated Troponin  History:         Patient has prior history of Echocardiogram examinations, most                  recent 07/21/2023. CAD and Acute MI, Prior CABG,                  Arrythmias:Bradycardia, Signs/Symptoms:Shortness of Breath and                  Syncope; Risk Factors:Hypertension, Dyslipidemia and Current                  Smoker. CKD.  Sonographer:     Mikki Harbor Referring Phys:  [id number] Emeline General Diagnosing Phys: Marcina Millard MD  Sonographer Comments: Global longitudinal strain was attempted. IMPRESSIONS  1. Left ventricular ejection fraction, by estimation, is 30 to 35%. The left ventricle has moderately decreased function. The left ventricle has no regional wall motion abnormalities. There is moderate left ventricular hypertrophy. Left ventricular diastolic parameters were normal. The global longitudinal strain is abnormal.  2. Right ventricular systolic function is normal. The right ventricular size is normal. There is normal pulmonary artery systolic pressure.  3. The mitral valve is normal in structure. Mild mitral valve regurgitation. No evidence of mitral stenosis.  4. The aortic valve is normal in structure. Aortic valve regurgitation is not visualized. No aortic stenosis is present.  5. The inferior vena cava is normal in size with greater than 50% respiratory variability, suggesting right atrial pressure of 3 mmHg. FINDINGS  Left Ventricle: Left ventricular ejection fraction, by estimation, is 30 to 35%. The left ventricle has moderately decreased function. The left ventricle has no regional wall motion abnormalities. Strain was performed and the global longitudinal strain is abnormal. The left ventricular internal cavity size was normal in size. There is moderate left ventricular hypertrophy. Left ventricular  diastolic parameters were normal. Right Ventricle: The right ventricular size is normal. No increase in right ventricular wall thickness. Right ventricular systolic function is normal. There is normal pulmonary artery systolic pressure. The tricuspid regurgitant velocity is 1.57 m/s, and  with an assumed right atrial pressure of 8 mmHg, the estimated right ventricular systolic pressure is 17.9 mmHg. Left Atrium: Left atrial size was normal in size. Right Atrium: Right atrial size was normal in size. Pericardium: There is no evidence of pericardial effusion. Mitral Valve: The mitral valve is normal in structure. Mild mitral valve regurgitation. No evidence of mitral valve stenosis. MV peak gradient, 2.9 mmHg. The mean mitral valve gradient is 1.0 mmHg. Tricuspid Valve: The tricuspid valve is normal  in structure. Tricuspid valve regurgitation is mild . No evidence of tricuspid stenosis. Aortic Valve: The aortic valve is normal in structure. Aortic valve regurgitation is not visualized. No aortic stenosis is present. Aortic valve mean gradient measures 2.0 mmHg. Aortic valve peak gradient measures 3.4 mmHg. Aortic valve area, by VTI measures 2.69 cm. Pulmonic Valve: The pulmonic valve was normal in structure. Pulmonic valve regurgitation is not visualized. No evidence of pulmonic stenosis. Aorta: The aortic root is normal in size and structure. Venous: The inferior vena cava is normal in size with greater than 50% respiratory variability, suggesting right atrial pressure of 3 mmHg. IAS/Shunts: No atrial level shunt detected by color flow Doppler. Additional Comments: 3D was performed not requiring image post processing on an independent workstation and was abnormal.  LEFT VENTRICLE PLAX 2D LVIDd:         4.90 cm      Diastology LVIDs:         4.00 cm      LV e' medial:    6.74 cm/s LV PW:         1.30 cm      LV E/e' medial:  13.9 LV IVS:        1.30 cm      LV e' lateral:   6.42 cm/s LVOT diam:     2.20 cm      LV  E/e' lateral: 14.6 LV SV:         44 LV SV Index:   25 LVOT Area:     3.80 cm  LV Volumes (MOD) LV vol d, MOD A2C: 118.0 ml LV vol d, MOD A4C: 94.1 ml LV vol s, MOD A2C: 82.8 ml LV vol s, MOD A4C: 66.7 ml LV SV MOD A2C:     35.2 ml LV SV MOD A4C:     94.1 ml LV SV MOD BP:      29.5 ml RIGHT VENTRICLE RV Basal diam:  3.50 cm RV Mid diam:    2.90 cm RV S prime:     9.57 cm/s TAPSE (M-mode): 2.0 cm LEFT ATRIUM             Index        RIGHT ATRIUM           Index LA diam:        4.50 cm 2.56 cm/m   RA Area:     16.60 cm LA Vol (A2C):   80.7 ml 45.83 ml/m  RA Volume:   50.10 ml  28.45 ml/m LA Vol (A4C):   46.6 ml 26.46 ml/m LA Biplane Vol: 61.9 ml 35.15 ml/m  AORTIC VALVE                    PULMONIC VALVE AV Area (Vmax):    3.17 cm     PV Vmax:       0.79 m/s AV Area (Vmean):   2.62 cm     PV Peak grad:  2.5 mmHg AV Area (VTI):     2.69 cm AV Vmax:           92.10 cm/s AV Vmean:          61.400 cm/s AV VTI:            0.164 m AV Peak Grad:      3.4 mmHg AV Mean Grad:      2.0 mmHg LVOT Vmax:         76.70 cm/s LVOT  Vmean:        42.300 cm/s LVOT VTI:          0.116 m LVOT/AV VTI ratio: 0.71  AORTA Ao Root diam: 3.60 cm MITRAL VALVE               TRICUSPID VALVE MV Area (PHT): 6.12 cm    TR Peak grad:   9.9 mmHg MV Area VTI:   1.92 cm    TR Vmax:        157.00 cm/s MV Peak grad:  2.9 mmHg MV Mean grad:  1.0 mmHg    SHUNTS MV Vmax:       0.86 m/s    Systemic VTI:  0.12 m MV Vmean:      54.9 cm/s   Systemic Diam: 2.20 cm MV Decel Time: 124 msec MV E velocity: 93.70 cm/s MV A velocity: 49.60 cm/s MV E/A ratio:  1.89 Marcina Millard MD Electronically signed by Marcina Millard MD Signature Date/Time: 10/23/2023/1:15:08 PM    Final    CT ABDOMEN PELVIS WO CONTRAST Result Date: 10/22/2023 CLINICAL DATA:  Abdominal bloating. EXAM: CT ABDOMEN AND PELVIS WITHOUT CONTRAST TECHNIQUE: Multidetector CT imaging of the abdomen and pelvis was performed following the standard protocol without IV contrast. RADIATION  DOSE REDUCTION: This exam was performed according to the departmental dose-optimization program which includes automated exposure control, adjustment of the mA and/or kV according to patient size and/or use of iterative reconstruction technique. COMPARISON:  12/02/2020. FINDINGS: Lower chest: Dependent atelectasis in the lung bases. Hepatobiliary: Tiny hypodensity in the dome of the left liver is stable consistent with benign etiology such as a cyst. No followup imaging is recommended. There is no evidence for gallstones, gallbladder wall thickening, or pericholecystic fluid. No intrahepatic or extrahepatic biliary dilation. Pancreas: Pancreas is diffusely atrophic. 1.8 x 1.6 cm soft tissue nodule in the uncinate process is stable since a 12/02/2020 exam at which time it showed avid arterial phase enhancement. Spleen: Splenectomy. Adrenals/Urinary Tract: No adrenal nodule or mass. Several punctate nonobstructing stones are seen in the right kidney 8 mm hyperattenuating subcapsular lesion anterior interpolar right kidney is too small to characterize but likely a cyst complicated by proteinaceous debris or hemorrhage. No followup imaging is recommended. Left kidney surgically absent. No right hydroureter. Bladder is nondistended and partially obscured by beam hardening artifact from right hip replacement. Stomach/Bowel: Stomach is distended with gas and food. Duodenum is normally positioned as is the ligament of Treitz. Small bowel loops in the left abdomen are dilated up to 2.7 cm diameter in a similar distribution to the previous study. Small bowel loops in the pelvis are nondilated. Patient is status post subtotal colectomy with diverticular disease noted in the distal left colon. Fluid in the rectum can be seen in the setting of diarrhea. Vascular/Lymphatic: There is moderate atherosclerotic calcification of the abdominal aorta without aneurysm. There is no gastrohepatic or hepatoduodenal ligament lymphadenopathy.  No retroperitoneal or mesenteric lymphadenopathy. No pelvic sidewall lymphadenopathy. Reproductive: Prostate gland is enlarged. Other: No intraperitoneal free fluid. Musculoskeletal: Status post right total hip replacement. Degenerative disc disease noted lower lumbar spine. IMPRESSION: 1. Distended small bowel loops in the left abdomen in a similar distribution to the previous study although the degree of small-bowel distention is less than on the prior study. Small bowel loops in the pelvis are nondilated. Imaging features are nonspecific but could reflect underlying small bowel stricture. 2. 1.8 x 1.6 cm soft tissue nodule in the uncinate process of the pancreas is  stable since a 12/02/2020 exam at which time it showed avid arterial phase enhancement. This same lesion was present on a noncontrast CT of 10/12/2019 and measured 3.3 x 2.8 cm at that time. Previous exam raise the question of metastatic deposit from renal cell carcinoma. 3. Status post subtotal colectomy with diverticular disease in the distal left colon. 4. Punctate nonobstructing right renal stones. 5. Prostatomegaly. 6.  Aortic Atherosclerois (ICD10-170.0) Electronically Signed   By: Kennith Center M.D.   On: 10/22/2023 06:17   DG Abdomen Acute W/Chest Result Date: 10/22/2023 CLINICAL DATA:  89 year old male with gas and bloating. EXAM: DG ABDOMEN ACUTE WITH 1 VIEW CHEST COMPARISON:  Chest radiographs 07/20/2023. Abdominal radiographs 12/04/2020 and earlier. FINDINGS: PA view of the chest. Chronic CABG. Stable cardiac size and mediastinal contours. Visualized tracheal air column is within normal limits. Lung volumes remain within normal limits. The lungs appear stable, clear. No pneumothorax or pneumoperitoneum. Upright and supine views of the abdomen and pelvis. Chronic right hip arthroplasty. Extensive chronic surgical clips along the abdominal aorta in the abdomen, visible on prior 2022 CTA. Superimposed numerous small bowel air-fluid levels  throughout the left abdomen, although mostly nondilated small bowel there on the supine views. More featureless appearing mid abdominal bowel loops. No pneumoperitoneum. No acute osseous abnormality identified. IMPRESSION: 1. Bowel-gas pattern compatible with Partial or Early Small Bowel Obstruction. No pneumoperitoneum. 2.  No acute cardiopulmonary abnormality. Electronically Signed   By: Odessa Fleming M.D.   On: 10/22/2023 05:03      Time coordinating discharge: over 30 minutes  SIGNED:  Sunnie Nielsen DO [facility]

## 2023-11-11 NOTE — Progress Notes (Signed)
 Patient was significantly deconditioned by enteritis and needed PT therapy.

## 2024-01-16 ENCOUNTER — Other Ambulatory Visit: Payer: Self-pay

## 2024-01-16 ENCOUNTER — Emergency Department

## 2024-01-16 ENCOUNTER — Emergency Department
Admission: EM | Admit: 2024-01-16 | Discharge: 2024-01-16 | Disposition: A | Discharge status: Home / Self Care | Attending: Emergency Medicine | Admitting: Emergency Medicine

## 2024-01-16 DIAGNOSIS — Z951 Presence of aortocoronary bypass graft: Secondary | ICD-10-CM | POA: Diagnosis not present

## 2024-01-16 DIAGNOSIS — I2510 Atherosclerotic heart disease of native coronary artery without angina pectoris: Secondary | ICD-10-CM | POA: Diagnosis not present

## 2024-01-16 DIAGNOSIS — E119 Type 2 diabetes mellitus without complications: Secondary | ICD-10-CM | POA: Insufficient documentation

## 2024-01-16 DIAGNOSIS — R0789 Other chest pain: Secondary | ICD-10-CM | POA: Diagnosis present

## 2024-01-16 DIAGNOSIS — Z95810 Presence of automatic (implantable) cardiac defibrillator: Secondary | ICD-10-CM | POA: Insufficient documentation

## 2024-01-16 DIAGNOSIS — I5023 Acute on chronic systolic (congestive) heart failure: Secondary | ICD-10-CM | POA: Insufficient documentation

## 2024-01-16 DIAGNOSIS — J449 Chronic obstructive pulmonary disease, unspecified: Secondary | ICD-10-CM | POA: Insufficient documentation

## 2024-01-16 LAB — CBC
HCT: 41.4 % (ref 39.0–52.0)
Hemoglobin: 13.4 g/dL (ref 13.0–17.0)
MCH: 33.6 pg (ref 26.0–34.0)
MCHC: 32.4 g/dL (ref 30.0–36.0)
MCV: 103.8 fL — ABNORMAL HIGH (ref 80.0–100.0)
Platelets: 172 10*3/uL (ref 150–400)
RBC: 3.99 MIL/uL — ABNORMAL LOW (ref 4.22–5.81)
RDW: 14.7 % (ref 11.5–15.5)
WBC: 14 10*3/uL — ABNORMAL HIGH (ref 4.0–10.5)
nRBC: 0 % (ref 0.0–0.2)

## 2024-01-16 LAB — BASIC METABOLIC PANEL WITH GFR
Anion gap: 9 (ref 5–15)
BUN: 23 mg/dL (ref 8–23)
CO2: 25 mmol/L (ref 22–32)
Calcium: 9.9 mg/dL (ref 8.9–10.3)
Chloride: 104 mmol/L (ref 98–111)
Creatinine, Ser: 1.27 mg/dL — ABNORMAL HIGH (ref 0.61–1.24)
GFR, Estimated: 55 mL/min — ABNORMAL LOW (ref >60)
Glucose, Bld: 136 mg/dL — ABNORMAL HIGH (ref 70–99)
Potassium: 4.1 mmol/L (ref 3.5–5.1)
Sodium: 138 mmol/L (ref 135–145)

## 2024-01-16 LAB — TROPONIN I (HIGH SENSITIVITY)
Troponin I (High Sensitivity): 115 ng/L (ref <18)
Troponin I (High Sensitivity): 105 ng/L (ref <18)

## 2024-01-16 LAB — BRAIN NATRIURETIC PEPTIDE: B Natriuretic Peptide: 1129.3 pg/mL — ABNORMAL HIGH (ref 0.0–100.0)

## 2024-01-16 MED ORDER — FUROSEMIDE 20 MG PO TABS: 20.0000 mg | ORAL_TABLET | Freq: Every day | ORAL | 1 refills | Status: AC

## 2024-01-16 MED ORDER — FUROSEMIDE 10 MG/ML IJ SOLN
40.0000 mg | Freq: Once | INTRAMUSCULAR | Status: AC
  Administered 2024-01-16: 40 mg via INTRAVENOUS
  Filled 2024-01-16: qty 4

## 2024-01-16 NOTE — ED Notes (Signed)
 Warm blankets given to Pt and wife.

## 2024-01-16 NOTE — ED Triage Notes (Signed)
 Pt reports he hasn't been able to sleep due to feeling discomfort in his left chest, pt reports the feeling of heat in his chest and feeling sob and weakness. Pt states he has hx heart failure, has had triple bypass in the past.

## 2024-01-16 NOTE — ED Notes (Signed)
 Critical trop 115, Dr. Felipe Horton notified

## 2024-01-16 NOTE — ED Notes (Signed)
 This RN fixed Pt leads

## 2024-01-16 NOTE — ED Provider Notes (Signed)
 [facility] Provider Note    Event Date/Time   First MD Initiated Contact with Patient 01/16/24 [phone]     (approximate)   History   Chest Pain   HPI  Daniel Bray is a 89 y.o. male who presents to the ED for evaluation of Chest Pain   Review a [facility] cardiology clinic visit from 2 months ago.  CAD s/p CABG times 10/22/2013, EF 30% ischemic cardiomyopathy, VT s/p ablation 2017, ICD in place, COPD, DM.  Patient presents alongside his wife for evaluation of 2-3 days of progressive heat sensation in his chest, worse when laying flat, dyspnea on exertion and lower extremity edema.  He reports having Lasix  at home but he only uses it as needed, no regular dosing.  Did take 1 dose yesterday   No cough or productive cough, no fevers, dizziness or falls.   Physical Exam   Triage Vital Signs: ED Triage Vitals  Encounter Vitals Group     BP 01/16/24 0229 138/86     Girls Systolic BP Percentile --      Girls Diastolic BP Percentile --      Boys Systolic BP Percentile --      Boys Diastolic BP Percentile --      Pulse Rate 01/16/24 0229 78     Resp 01/16/24 0229 18     Temp 01/16/24 0229 (!) 97.5 F (36.4 C)     Temp src --      SpO2 01/16/24 0229 99 %     Weight 01/16/24 0228 143 lb (64.9 kg)     Height 01/16/24 0228 5' 10 (1.778 m)     Head Circumference --      Peak Flow --      Pain Score 01/16/24 0228 4     Pain Loc --      Pain Education --      Exclude from Growth Chart --     Most recent vital signs: Vitals:   01/16/24 0430 01/16/24 0500  BP: (!) 140/92 (!) 144/88  Pulse: 75 67  Resp: 13   Temp:    SpO2: 99% 100%    General: Awake, no distress.  CV:  Good peripheral perfusion.  Resp:  Normal effort.  No wheezing Abd:  No distention.  MSK:  No deformity noted.  Pitting edema to bilateral lower extremities Neuro:  No focal deficits appreciated. Other:     ED Results / Procedures / Treatments   Labs (all labs ordered  are listed, but only abnormal results are displayed) Labs Reviewed  BASIC METABOLIC PANEL WITH GFR - Abnormal; Notable for the following components:      Result Value   Glucose, Bld 136 (*)    Creatinine, Ser 1.27 (*)    GFR, Estimated 55 (*)    All other components within normal limits  CBC - Abnormal; Notable for the following components:   WBC 14.0 (*)    RBC 3.99 (*)    MCV 103.8 (*)    All other components within normal limits  BRAIN NATRIURETIC PEPTIDE - Abnormal; Notable for the following components:   B Natriuretic Peptide 1,129.3 (*)    All other components within normal limits  TROPONIN I (HIGH SENSITIVITY) - Abnormal; Notable for the following components:   Troponin I (High Sensitivity) 115 (*)    All other components within normal limits  TROPONIN I (HIGH SENSITIVITY) - Abnormal; Notable for the following components:   Troponin I (High Sensitivity)  105 (*)    All other components within normal limits    EKG Poor quality EKG time 2.  Seems to demonstrate a sinus rhythm with a rate of 85 bpm.  Leftward axis, left bundle morphology, nonspecific changes without clear STEMI.  RADIOLOGY 1 view CXR interpreted by me without clear signs of acute pathology  Official radiology report(s): DG Chest Port 1 View Result Date: 01/16/2024 CLINICAL DATA:  Chest pain EXAM: PORTABLE CHEST 1 VIEW COMPARISON:  10/22/2023 FINDINGS: Cardiac shadow is stable. Postsurgical changes are noted. Elevation of the right hemidiaphragm is seen. Mild bibasilar atelectasis is seen. No bony abnormality is noted. IMPRESSION: Basilar atelectasis. Electronically Signed   By: Violeta Grey M.D.   On: 01/16/2024 02:57    PROCEDURES and INTERVENTIONS:  .1-3 Lead EKG Interpretation  Performed by: Arline Bennett, MD Authorized by: Arline Bennett, MD     Interpretation: normal     ECG rate:  72   ECG rate assessment: normal     Rhythm: sinus rhythm     Ectopy: none     Conduction: normal     Medications   furosemide  (LASIX ) injection 40 mg (40 mg Intravenous Given 01/16/24 0429)     IMPRESSION / MDM / ASSESSMENT AND PLAN / ED COURSE  I reviewed the triage vital signs and the nursing notes.  Differential diagnosis includes, but is not limited to, ACS, PTX, PNA, muscle strain/spasm, PE, dissection, anxiety, pleural effusion  {Patient presents with symptoms of an acute illness or injury that is potentially life-threatening.  Pleasant 89 year old presents with signs of a mild CHF exacerbation suitable for outpatient management with close CHF clinic follow-up.  Nonischemic EKG.  2 troponins are flat and chronically elevated.  BNP is acutely elevated.  X-ray without infiltrate.  Nonspecific leukocytosis is noted but I doubt infectious etiology of his symptoms.  Renal dysfunction near baseline.  With IV diuresis he generates good urine output, feeling better.  I considered admission for this patient and offered to him on multiple occasions but he declines.  Clinical Course as of 01/16/24 0555  Fri Jan 16, 2024  6578 Reassessed and discussed plan of care.  Patient is eager to go home.  I expressed some hesitancy but we discussed observation in the ED, diuresis, second troponin, reassessment.  He does not want to be admitted [DS]  0548 Reassessed.  Patient has put out about 750 cc of urine.  He reports feeling better.  Still eager to go home.  We discussed ambulation trial with pulse ox and possible outpatient management.  We discussed CHF clinic follow-up and ED return precautions.  He is appreciative [DS]  0554 Ambulating briskly in the hall by himself without assistant device.  Saturations remain 99% and higher.  No significant dyspnea or tachypnea.  He is eager to go home [DS]    Clinical Course User Index [DS] Arline Bennett, MD     FINAL CLINICAL IMPRESSION(S) / ED DIAGNOSES   Final diagnoses:  Acute on chronic systolic congestive heart failure [name])     Rx / DC Orders   ED Discharge  Orders          Ordered    AMB referral to CHF clinic        01/16/24 0549    furosemide  (LASIX ) 20 MG tablet  Daily        01/16/24 0550             Note:  This document was prepared using Dragon voice  recognition software and may include unintentional dictation errors.   Arline Bennett, MD 01/16/24 [phone]

## 2024-01-16 NOTE — Discharge Instructions (Addendum)
 As we discussed, Daniel Bray should reach out to you to schedule an appointment for follow-up in the CHF clinic next week.  I have also attached the phone number for her clinic, call her if you do not hear from her by Monday  For the next 3 days, take furosemide /Lasix  diuretic once per day, then after this go back to as needed dosing as you have been  Continue to take your other regular prescription medications as well  If your symptoms worsen despite these measures then please return to the ED

## 2024-01-22 ENCOUNTER — Ambulatory Visit: Admitting: Dermatology

## 2024-01-22 ENCOUNTER — Encounter: Payer: Self-pay | Admitting: Dermatology

## 2024-01-22 DIAGNOSIS — D099 Carcinoma in situ, unspecified: Secondary | ICD-10-CM

## 2024-01-22 DIAGNOSIS — S91001A Unspecified open wound, right ankle, initial encounter: Secondary | ICD-10-CM | POA: Diagnosis not present

## 2024-01-22 DIAGNOSIS — Z85828 Personal history of other malignant neoplasm of skin: Secondary | ICD-10-CM | POA: Diagnosis not present

## 2024-01-22 DIAGNOSIS — W908XXA Exposure to other nonionizing radiation, initial encounter: Secondary | ICD-10-CM

## 2024-01-22 DIAGNOSIS — D0439 Carcinoma in situ of skin of other parts of face: Secondary | ICD-10-CM

## 2024-01-22 DIAGNOSIS — D492 Neoplasm of unspecified behavior of bone, soft tissue, and skin: Secondary | ICD-10-CM | POA: Diagnosis not present

## 2024-01-22 DIAGNOSIS — T1490XD Injury, unspecified, subsequent encounter: Secondary | ICD-10-CM

## 2024-01-22 DIAGNOSIS — L570 Actinic keratosis: Secondary | ICD-10-CM | POA: Diagnosis not present

## 2024-01-22 DIAGNOSIS — D485 Neoplasm of uncertain behavior of skin: Secondary | ICD-10-CM

## 2024-01-22 HISTORY — DX: Carcinoma in situ, unspecified: D09.9

## 2024-01-22 NOTE — Patient Instructions (Addendum)

## 2024-01-22 NOTE — Progress Notes (Signed)
 New Patient Visit   Subjective  Daniel Bray is a 89 y.o. male who presents for the following: spots at face, ears that are rough. Spot at left arm, one at right lower leg that won't heal present for at least 1 month.  Patient did have Mohs at [facility] about a year ago for [facility] at nose. He was seeing Dr. Tresa Frohlich at [facility].  The patient has spots, moles and lesions to be evaluated, some may be new or changing and the patient may have concern these could be cancer.   The following portions of the chart were reviewed this encounter and updated as appropriate: medications, allergies, medical history  Review of Systems:  No other skin or systemic complaints except as noted in HPI or Assessment and Plan.  Objective  Well appearing patient in no apparent distress; mood and affect are within normal limits.   A focused examination was performed of the following areas: Arms, legs, face and ears  Relevant exam findings are noted in the Assessment and Plan.  R helix x 3, R lobe x 1, R zygoma x 1, sup to R brow x 1, R forehead x 1, L sup helix x 1, L post helix x 1, L frontal scalp x 1, L temple x 1, L jawline x 1, L tragus x 1, nasal dorsum x 1 (14) Erythematous thin papules/macules with gritty scale.  Right Ankle - Anterior Two crusted ulcerated pink plaques on R lower distal leg. No evidence of malignancy left cheek 6 mm crusted scaly papule   Assessment & Plan   HISTORY OF BASAL CELL CARCINOMA OF THE SKIN - nasal supratip, Mohs 09/18/22 Dr. Robert Chimes - No evidence of recurrence today - Recommend regular full body skin exams - Recommend daily broad spectrum sunscreen SPF 30+ to sun-exposed areas, reapply every 2 hours as needed.  - Call if any new or changing lesions are noted between office visits   AK (ACTINIC KERATOSIS) (14) R helix x 3, R lobe x 1, R zygoma x 1, sup to R brow x 1, R forehead x 1, L sup helix x 1, L post helix x 1, L frontal scalp x 1, L temple  x 1, L jawline x 1, L tragus x 1, nasal dorsum x 1 (14) Actinic keratoses are precancerous spots that appear secondary to cumulative UV radiation exposure/sun exposure over time. They are chronic with expected duration over 1 year. A portion of actinic keratoses will progress to squamous cell carcinoma of the skin. It is not possible to reliably predict which spots will progress to skin cancer and so treatment is recommended to prevent development of skin cancer.  Recommend daily broad spectrum sunscreen SPF 30+ to sun-exposed areas, reapply every 2 hours as needed.  Recommend staying in the shade or wearing long sleeves, sun glasses (UVA+UVB protection) and wide brim hats (4-inch brim around the entire circumference of the hat). Call for new or changing lesions. Destruction of lesion - R helix x 3, R lobe x 1, R zygoma x 1, sup to R brow x 1, R forehead x 1, L sup helix x 1, L post helix x 1, L frontal scalp x 1, L temple x 1, L jawline x 1, L tragus x 1, nasal dorsum x 1 (14) Complexity: simple   Destruction method: cryotherapy   Informed consent: discussed and consent obtained   Timeout:  patient name, date of birth, surgical site, and procedure verified Lesion destroyed using liquid  nitrogen: Yes   Region frozen until ice ball extended beyond lesion: Yes   Cryo cycles: 1 or 2. Outcome: patient tolerated procedure well with no complications   Post-procedure details: wound care instructions given   HEALING WOUND Right Ankle - Anterior Patient advised to use Vaseline or Aquaphor and cover.  If not resolved in 2 months, return for bx.    NEOPLASM OF UNCERTAIN BEHAVIOR OF SKIN left cheek Skin / nail biopsy Type of biopsy: tangential   Informed consent: discussed and consent obtained   Timeout: patient name, date of birth, surgical site, and procedure verified   Procedure prep:  Patient was prepped and draped in usual sterile fashion Prep type:  Isopropyl alcohol Anesthesia: the lesion  was anesthetized in a standard fashion   Anesthetic:  1% lidocaine  w/ epinephrine 1-100,000 buffered w/ 8.4% NaHCO3 Instrument used: DermaBlade   Hemostasis achieved with: pressure and aluminum chloride   Outcome: patient tolerated procedure well   Post-procedure details: sterile dressing applied and wound care instructions given   Dressing type: bandage and petrolatum   Specimen 1 - Surgical pathology Differential Diagnosis: AK vs SCC  Check Margins: No  Return in about 6 months (around 07/23/2024) for with Dr. Felipe Horton, TBSE, HxBCC, HxAK.  Kerstin Peeling, RMA, am acting as scribe for Harris Liming, MD .   Documentation: I have reviewed the above documentation for accuracy and completeness, and I agree with the above.  Harris Liming, MD

## 2024-01-26 ENCOUNTER — Ambulatory Visit: Payer: Self-pay | Admitting: Dermatology

## 2024-01-26 LAB — SURGICAL PATHOLOGY

## 2024-01-27 ENCOUNTER — Telehealth: Payer: Self-pay | Admitting: Cardiology

## 2024-01-27 MED ORDER — FLUOROURACIL 5 % EX CREA: TOPICAL_CREAM | CUTANEOUS | 2 refills | Status: AC

## 2024-01-27 NOTE — Telephone Encounter (Signed)
 Called to confirm/remind patient of their appointment at the [facility] on 01/28/24.   Appointment:   [x] Confirmed  [] Left mess   [] No answer/No voice mail  [] VM Full/unable to leave message  [] Phone not in service  Patient reminded to bring all medications and/or complete list.  Confirmed patient has transportation. Gave directions, instructed to utilize valet parking.

## 2024-01-27 NOTE — Telephone Encounter (Signed)
 Called patient. Discussed pathology results and treatment options. Patient prefers to use 5FU/Calcipotriene compound and follow up in 2 months. Rx sent to [place]. Detailed patient instructions sent to patient via MyChart message. 2 month follow up scheduled.

## 2024-01-27 NOTE — Telephone Encounter (Signed)
-----   Message from [place] sent at 01/26/2024  5:08 PM EDT ----- Diagnosis left cheek :       SQUAMOUS CELL CARCINOMA IN SITU, BASE INVOLVED    Please call with diagnosis and message me with patient's decision on treatment.   Explanation: Biopsy shows a squamous cell skin cancer limited to the top layer of skin. This means it is an early cancer and has not spread. However, it has the potential to spread beyond the skin  and threaten your health, so we recommend treating it.   Treatment option 1: a cream (fluorouracil and calcipotriene) that helps your immune system clear the skin cancer. It will cause redness and irritation. Wait two weeks after the biopsy to start  applying the cream. Apply the cream twice per day until the redness and irritation develop (usually occurs by day 7), then stop and allow it to heal. We will recheck the area in 2 months to ensure  the cancer is gone. The cream is $45 plus shipping and will be mailed to you from a low cost compounding pharmacy.  Treatment option 2: Mohs surgery, which involves cutting out right around the skin cancer and then checking under the microscope on the same day to ensure the whole skin cancer is out. If there is  more cancer remaining, the surgeon will repeat the process until it is fully cleared. The cure rate is about 98-99%. It is done at another office outside of [place] ([place], [place], or  [place]). Once the Mohs surgeon confirms the skin cancer is out, they will discuss the options to repair or heal the area. You must take it easy for about two weeks after surgery (no lifting over  10-15 lbs, avoid activity to get your heart rate and blood pressure up). ----- Message ----- From: Interface, Lab In [place] Sent: 01/26/2024   5:00 PM EDT To: Boneta Sharps, MD

## 2024-01-28 ENCOUNTER — Ambulatory Visit: Attending: Cardiology | Admitting: Cardiology

## 2024-01-28 VITALS — BP 141/84 | HR 79 | Wt 134.0 lb

## 2024-01-28 DIAGNOSIS — Z79899 Other long term (current) drug therapy: Secondary | ICD-10-CM | POA: Insufficient documentation

## 2024-01-28 DIAGNOSIS — I43 Cardiomyopathy in diseases classified elsewhere: Secondary | ICD-10-CM

## 2024-01-28 DIAGNOSIS — E854 Organ-limited amyloidosis: Secondary | ICD-10-CM

## 2024-01-28 DIAGNOSIS — I509 Heart failure, unspecified: Secondary | ICD-10-CM | POA: Diagnosis present

## 2024-02-03 NOTE — Progress Notes (Unsigned)
   ADVANCED HEART FAILURE NEW PATIENT CLINIC NOTE  Referring Physician: Claudene Rover, MD  Primary Care: Perri Constance Sor, PA-C Primary Cardiologist:  HPI: Daniel Bray is a 89 y.o. male with a PMH of Wild-type transthyretin amyloidosis who presents for initial visit for further evaluation and treatment of heart failure/cardiomyopathy.      Patient was diagnosed with transthyretin amyloidosis in 2023 after echocardiogram showed mildly reduced ejection fraction, 35 to 40%.  PYP scan was performed in September of that year that was strongly suggestive of ATTR amyloidosis.  He was referred to [facility] amyloid clinic, underwent right heart catheterization with normal filling pressures, and was started on tafamidis at that time.  His symptoms have overall been fairly stable since that time and he has continued on tafamidis.     SUBJECTIVE:  Patient overall states that he is doing well.  He is able to continue at his activities of daily living without any issue. He does not have issues with volume or significant neuropathy. He does get tired easier than he would like but otherwise has minimal complaints. We discussed his referral from the ED, and the fact that he has two excellent cardiac physicians involved in his care already. We discussed that unless he was interested in a second opinion, it would make more sense for him to continue to receive his care through [facility] and [facility] as he already has someone managing his cardiac amyloid. We discussed new medication options including attruby. Patient was agreeable and will reach out with any further questions.  PMH, current medications, allergies, social history, and family history reviewed in epic.  PHYSICAL EXAM: Vitals:   01/28/24 1539  BP: (!) 141/84  Pulse: 79  SpO2: 99%   GENERAL: Well nourished and in no apparent distress at rest.  PULM:  Normal work of breathing, clear to auscultation bilaterally. Respirations are unlabored.   CARDIAC:  JVP: flat         Normal rate with regular rhythm. No murmurs, rubs or gallops.  No edema. Warm and well perfused extremities. ABDOMEN: Soft, non-tender, non-distended. NEUROLOGIC: Patient is oriented x3 with no focal or lateralizing neurologic deficits.       ASSESSMENT & PLAN:  Cardiac amyloid: Most recent LVEF 30-35%, NYHA class II symptoms, euvolemic. Already follows with [facility] amyloid clinic. Instructed to reach out with any further questions or concerns, but given that he is already well established and on therapy there is no need for additional follow up with the [facility]. - Continue tafamadis for amyloid - Continue jardiance  25mg  daily - Continue lasix  20mg  daily - Continue imdur  60mg  daily  HTN:  - Mildly elevated today, continue medications as above  Follow up as needed   Morene Brownie, MD Advanced Heart Failure Mechanical Circulatory Support 02/03/24

## 2024-02-04 ENCOUNTER — Other Ambulatory Visit: Payer: Self-pay

## 2024-02-04 ENCOUNTER — Emergency Department
Admission: EM | Admit: 2024-02-04 | Discharge: 2024-02-04 | Disposition: A | Discharge status: Home / Self Care | Attending: Emergency Medicine | Admitting: Emergency Medicine

## 2024-02-04 ENCOUNTER — Encounter: Payer: Self-pay | Admitting: Emergency Medicine

## 2024-02-04 ENCOUNTER — Emergency Department

## 2024-02-04 DIAGNOSIS — I509 Heart failure, unspecified: Secondary | ICD-10-CM | POA: Diagnosis not present

## 2024-02-04 DIAGNOSIS — E8770 Fluid overload, unspecified: Secondary | ICD-10-CM | POA: Insufficient documentation

## 2024-02-04 DIAGNOSIS — R0602 Shortness of breath: Secondary | ICD-10-CM | POA: Insufficient documentation

## 2024-02-04 LAB — CBC
HCT: 44 % (ref 39.0–52.0)
Hemoglobin: 13.9 g/dL (ref 13.0–17.0)
MCH: 32.3 pg (ref 26.0–34.0)
MCHC: 31.6 g/dL (ref 30.0–36.0)
MCV: 102.1 fL — ABNORMAL HIGH (ref 80.0–100.0)
Platelets: 162 10*3/uL (ref 150–400)
RBC: 4.31 MIL/uL (ref 4.22–5.81)
RDW: 14.6 % (ref 11.5–15.5)
WBC: 13.8 10*3/uL — ABNORMAL HIGH (ref 4.0–10.5)
nRBC: 0 % (ref 0.0–0.2)

## 2024-02-04 LAB — BRAIN NATRIURETIC PEPTIDE: B Natriuretic Peptide: 1387.5 pg/mL — ABNORMAL HIGH (ref 0.0–100.0)

## 2024-02-04 LAB — BASIC METABOLIC PANEL WITH GFR
Anion gap: 12 (ref 5–15)
BUN: 23 mg/dL (ref 8–23)
CO2: 22 mmol/L (ref 22–32)
Calcium: 10.2 mg/dL (ref 8.9–10.3)
Chloride: 103 mmol/L (ref 98–111)
Creatinine, Ser: 1.13 mg/dL (ref 0.61–1.24)
GFR, Estimated: >60 mL/min (ref >60)
Glucose, Bld: 140 mg/dL — ABNORMAL HIGH (ref 70–99)
Potassium: 4.7 mmol/L (ref 3.5–5.1)
Sodium: 137 mmol/L (ref 135–145)

## 2024-02-04 LAB — TROPONIN I (HIGH SENSITIVITY)
Troponin I (High Sensitivity): 82 ng/L — ABNORMAL HIGH (ref <18)
Troponin I (High Sensitivity): 79 ng/L — ABNORMAL HIGH (ref <18)

## 2024-02-04 MED ORDER — IPRATROPIUM-ALBUTEROL 0.5-2.5 (3) MG/3ML IN SOLN
3.0000 mL | Freq: Once | RESPIRATORY_TRACT | Status: AC
  Administered 2024-02-04: 3 mL via RESPIRATORY_TRACT
  Filled 2024-02-04: qty 3

## 2024-02-04 MED ORDER — FUROSEMIDE 10 MG/ML IJ SOLN
40.0000 mg | Freq: Once | INTRAMUSCULAR | Status: AC
  Administered 2024-02-04: 40 mg via INTRAVENOUS
  Filled 2024-02-04: qty 4

## 2024-02-04 NOTE — ED Notes (Signed)
 Pt provided with TV remote. No other comfort measures requested at this time.

## 2024-02-04 NOTE — ED Notes (Signed)
 Floy MD made aware pt requesting glipizide and metformin be ordered.

## 2024-02-04 NOTE — ED Triage Notes (Signed)
 Patient to ED via POV for SOB. PT reports ongoing for a few days. States he has intermittent warmness on the left side of his chest but denies pain. Hx of heart failure and amyloidosis. NAD noted.

## 2024-02-04 NOTE — Discharge Instructions (Addendum)
As we discussed please.

## 2024-02-04 NOTE — ED Provider Notes (Signed)
 [facility] Provider Note    Event Date/Time   First MD Initiated Contact with Patient 02/04/24 1701     (approximate)   History   Shortness of Breath   HPI  Daniel Bray is a 89 y.o. male who presents to the emergency department today because of concerns for shortness of breath.  Symptoms have been ongoing for a couple of weeks.  It has interfered with his ability to sleep.  He also had an episode of some left chest discomfort earlier today.  Has resolved by the time my exam.  Has a history of heart failure and has been checking his weight and has not noticed any significant increase.  Additionally has not noticed any swelling in his legs.  No swelling in his abdomen.  Denies any fevers.  Has an inhaler at home that he has been tried without any significant relief.     Physical Exam   Triage Vital Signs: ED Triage Vitals  Encounter Vitals Group     BP 02/04/24 1654 (!) 142/100     Girls Systolic BP Percentile --      Girls Diastolic BP Percentile --      Boys Systolic BP Percentile --      Boys Diastolic BP Percentile --      Pulse Rate 02/04/24 1654 92     Resp 02/04/24 1654 17     Temp 02/04/24 1654 98.2 F (36.8 C)     Temp Source 02/04/24 1654 Oral     SpO2 02/04/24 1654 98 %     Weight 02/04/24 1655 136 lb (61.7 kg)     Height 02/04/24 1655 5' 10 (1.778 m)     Head Circumference --      Peak Flow --      Pain Score 02/04/24 1655 0     Pain Loc --      Pain Education --      Exclude from Growth Chart --     Most recent vital signs: Vitals:   02/04/24 1654  BP: (!) 142/100  Pulse: 92  Resp: 17  Temp: 98.2 F (36.8 C)  SpO2: 98%   General: Awake, alert, oriented. CV:  Good peripheral perfusion. Regular rate and rhythm. Resp:  Normal effort. Lungs clear. Abd:  No distention.    ED Results / Procedures / Treatments   Labs (all labs ordered are listed, but only abnormal results are displayed) Labs Reviewed  BASIC  METABOLIC PANEL WITH GFR - Abnormal; Notable for the following components:      Result Value   Glucose, Bld 140 (*)    All other components within normal limits  CBC - Abnormal; Notable for the following components:   WBC 13.8 (*)    MCV 102.1 (*)    All other components within normal limits  BRAIN NATRIURETIC PEPTIDE - Abnormal; Notable for the following components:   B Natriuretic Peptide 1,387.5 (*)    All other components within normal limits  TROPONIN I (HIGH SENSITIVITY) - Abnormal; Notable for the following components:   Troponin I (High Sensitivity) 82 (*)    All other components within normal limits  TROPONIN I (HIGH SENSITIVITY) - Abnormal; Notable for the following components:   Troponin I (High Sensitivity) 79 (*)    All other components within normal limits     EKG  I, Guadalupe Eagles, attending physician, personally viewed and interpreted this EKG  EKG Time: 1658 Rate: 92 Rhythm: sinus rhythm with  1st degree av block Axis: left axis deviation Intervals: qtc 492 QRS: LVH ST changes: no st elevation Impression: abnormal ekg   RADIOLOGY I independently interpreted and visualized the CXR. My interpretation: No pneumonia Radiology interpretation: IMPRESSION:  1. Increased peribronchial thickening, may represent bronchitis or  less likely pulmonary edema.  2. Stable cardiomegaly.  3. Chronic elevation of right hemidiaphragm.     PROCEDURES:  Critical Care performed: No   MEDICATIONS ORDERED IN ED: Medications - No data to display   IMPRESSION / MDM / ASSESSMENT AND PLAN / ED COURSE  I reviewed the triage vital signs and the nursing notes.                              Differential diagnosis includes, but is not limited to, pneumonia, viral illness, ACS, CHF, COPD  Patient's presentation is most consistent with acute presentation with potential threat to life or bodily function.   The patient is on the cardiac monitor to evaluate for evidence of  arrhythmia and/or significant heart rate changes.  Patient presented to the emergency department today because of concerns for shortness of breath.  The patient had x-ray which was concerning for bronchitis versus pulmonary edema.  Blood work however shows significantly elevated BNP.  Did give both a DuoNeb and some Lasix .  He did start feeling better.  Initial troponin was elevated however repeat is stable.  At this time I think likely secondary to CHF.  Given the patient is feeling better I do think it is reasonable for patient to be discharged home.  Did advise patient to take his water pills over the next few days.      FINAL CLINICAL IMPRESSION(S) / ED DIAGNOSES   Final diagnoses:  Shortness of breath  Hypervolemia, unspecified hypervolemia type     Note:  This document was prepared using Dragon voice recognition software and may include unintentional dictation errors.    Floy Roberts, MD 02/04/24 [phone]

## 2024-03-18 ENCOUNTER — Ambulatory Visit: Admitting: Dermatology

## 2024-03-18 ENCOUNTER — Encounter: Payer: Self-pay | Admitting: Dermatology

## 2024-03-18 DIAGNOSIS — Z1621 Resistance to vancomycin: Secondary | ICD-10-CM | POA: Insufficient documentation

## 2024-03-18 DIAGNOSIS — I493 Ventricular premature depolarization: Secondary | ICD-10-CM | POA: Insufficient documentation

## 2024-03-18 DIAGNOSIS — C9141 Hairy cell leukemia, in remission: Secondary | ICD-10-CM | POA: Insufficient documentation

## 2024-03-18 DIAGNOSIS — L820 Inflamed seborrheic keratosis: Secondary | ICD-10-CM | POA: Diagnosis not present

## 2024-03-18 DIAGNOSIS — I10 Essential (primary) hypertension: Secondary | ICD-10-CM | POA: Insufficient documentation

## 2024-03-18 DIAGNOSIS — Z86007 Personal history of in-situ neoplasm of skin: Secondary | ICD-10-CM | POA: Diagnosis not present

## 2024-03-18 DIAGNOSIS — C649 Malignant neoplasm of unspecified kidney, except renal pelvis: Secondary | ICD-10-CM | POA: Insufficient documentation

## 2024-03-18 DIAGNOSIS — I5022 Chronic systolic (congestive) heart failure: Secondary | ICD-10-CM | POA: Insufficient documentation

## 2024-03-18 DIAGNOSIS — Z8614 Personal history of Methicillin resistant Staphylococcus aureus infection: Secondary | ICD-10-CM | POA: Insufficient documentation

## 2024-03-18 DIAGNOSIS — I255 Ischemic cardiomyopathy: Secondary | ICD-10-CM | POA: Insufficient documentation

## 2024-03-18 NOTE — Patient Instructions (Signed)

## 2024-03-18 NOTE — Progress Notes (Signed)
   Follow-Up Visit   Subjective  Daniel Bray is a 89 y.o. male who presents for the following: 2 months f/u on the left cheek- biopsy proven SCCis treated with fluorouracil /calcipotriene cream with a good response.  The patient has lesions to be evaluated on his right hand and right post auricular.    The following portions of the chart were reviewed this encounter and updated as appropriate: medications, allergies, medical history  Review of Systems:  No other skin or systemic complaints except as noted in HPI or Assessment and Plan.  Objective  Well appearing patient in no apparent distress; mood and affect are within normal limits.  A focused examination was performed of the following areas: face  Relevant exam findings are noted in the Assessment and Plan.  right dorsal hand, right post auricular  Stuck-on, waxy, tan-brown papules and plaques -- Discussed benign etiology and prognosis.   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  - Call if any new or changing lesions are noted between office visits   SEBORRHEIC KERATOSIS - Stuck-on, waxy, tan-brown papules and/or plaques  - Benign-appearing - Discussed benign etiology and prognosis. - Observe - Call for any changes  INFLAMED SEBORRHEIC KERATOSIS right dorsal hand, right post auricular Patient decline treatment  HISTORY OF SQUAMOUS CELL CARCINOMA IN SITU (SCCIS)    Return for scheduled appt Jul 22, 2024.  IFay Kirks, CMA, am acting as scribe for Boneta Sharps, MD .   Documentation: I have reviewed the above documentation for accuracy and completeness, and I agree with the above.  Boneta Sharps, MD

## 2024-07-22 ENCOUNTER — Ambulatory Visit: Admitting: Dermatology

## 2024-08-06 ENCOUNTER — Encounter: Payer: Self-pay | Admitting: Emergency Medicine

## 2024-08-06 ENCOUNTER — Ambulatory Visit: Admission: EM | Admit: 2024-08-06 | Discharge: 2024-08-06 | Disposition: A | Discharge status: Home / Self Care

## 2024-08-06 DIAGNOSIS — S51811A Laceration without foreign body of right forearm, initial encounter: Secondary | ICD-10-CM

## 2024-08-06 NOTE — Discharge Instructions (Addendum)
-  You are advised to return if increased redness, swelling, pustular drainage, pain, red streak up arm.

## 2024-08-06 NOTE — ED Provider Notes (Signed)
 " [facility]    CSN: [id number] Arrival date & time: 08/06/24  1023      History   Chief Complaint Chief Complaint  Patient presents with   Laceration    Skin Tear    HPI Daniel Bray is a 89 y.o. male presenting with his wife for laceration of the right forearm that occurred 2 days ago.  He states his wife was falling and he tried to catch her and cut his arm on something in the bathroom.  He is up-to-date with his tetanus immunization.  Takes a baby aspirin  but no anticoagulants.  Denies head injury.  Applied a topical clotting powder and bleeding has stopped.  Denies any redness or swelling.  Denies any bony tenderness.  No other complaints or injuries.  HPI  Past Medical History:  Diagnosis Date   BCC (basal cell carcinoma) 09/18/2022   nasal supratip, Mohs [facility] Dr[facility] 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 Tx with 5FU/Calcip. 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   NSTEMI (non-ST elevated myocardial infarction) (HCC) 10/22/2023   General weakness 07/22/2023   Weight loss 07/20/2023   Nontraumatic complete tear of left rotator cuff 07/07/2023   Injury of tendon of long head of left biceps 07/07/2023   Iron deficiency 04/10/2023   MGUS (monoclonal gammopathy of unknown  significance) 06/25/2022   1st degree AV block 06/20/2022   Weakness 01/17/2022   SOB (shortness of breath) 11/23/2021   Type II diabetes mellitus with renal manifestations (HCC) 11/23/2021   Hiccups    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   Diabetic polyneuropathy associated with type 2 diabetes mellitus (HCC)    Ileus (HCC) 05/29/2020   Acute renal failure superimposed on stage 3a chronic kidney disease (HCC) 05/28/2020   Nausea vomiting and diarrhea 05/28/2020   Abdominal pain 05/28/2020   Hyperkalemia 05/28/2020   Hypercalcemia 05/28/2020   Elevated troponin 01/24/2020   Leukocytosis 01/24/2020   HLD (hyperlipidemia) 01/24/2020   GERD (gastroesophageal reflux disease) 01/24/2020   Anxiety 01/24/2020   CAD (coronary artery disease) 01/24/2020   Tobacco abuse 01/24/2020   Stage 3b chronic kidney disease (HCC) 01/24/2020   Bradycardia 01/24/2020   Laceration of forehead    Syncope 10/12/2019   Type 2 diabetes mellitus with hyperlipidemia (HCC) 10/12/2019   Accelerated hypertension 10/12/2019   History of renal cell carcinoma s/p nephrectomy 10/12/2019   SBO (small bowel obstruction) (HCC) 10/12/2019   Hypothyroidism 10/12/2019   Aerophagia 12/05/2017   Status post hip surgery 11/15/2016   Psoas tendinitis of right side 09/15/2016   Iron deficiency anemia due to chronic blood loss 09/13/2016   Palpitations 08/06/2015   Stenosis of lumbosacral spine 06/28/2015   Primary osteoarthritis of right knee 05/01/2015   Status  post total replacement of right hip 04/06/2015   Primary osteoarthritis of both hips 12/27/2014   Restless legs syndrome 05/11/2014   Trochanteric bursitis, right hip 03/02/2014   Pelvic floor dysfunction 08/12/2013   Exocrine pancreatic insufficiency 05/20/2013   Pure hypercholesterolemia 03/12/2011   LV dysfunction 03/12/2011   Hyperparathyroidism 03/12/2011   History of atrial fibrillation 03/12/2011   ED (erectile dysfunction) 03/12/2011   Chronic constipation  03/12/2011   Carotid artery stenosis, asymptomatic 03/12/2011    Past Surgical History:  Procedure Laterality Date   CARDIAC ELECTROPHYSIOLOGY STUDY AND ABLATION     CARDIAC SURGERY     CORONARY ANGIOPLASTY WITH STENT PLACEMENT     CORONARY ARTERY BYPASS GRAFT     x3   LEFT HEART CATH AND CORONARY ANGIOGRAPHY N/A 08/03/2021   Procedure: LEFT HEART CATH AND CORONARY ANGIOGRAPHY;  Surgeon: Lawyer Bernardino Cough, MD;  Location: [facility] INVASIVE CV [facility];  Service: Cardiovascular;  Laterality: N/A;   PANCREAS SURGERY     RIGHT HEART CATH N/A 01/18/2022   Procedure: RIGHT HEART CATH;  Surgeon: Florencio Cara BIRCH, MD;  Location: [facility] INVASIVE CV LAB;  Service: Cardiovascular;  Laterality: N/A;   SPLENECTOMY         Home Medications    Prior to Admission medications  Medication Sig Start Date End Date Taking? Authorizing Provider  acetaminophen  (TYLENOL ) 650 MG CR tablet Take 1,300 mg by mouth every 8 (eight) hours as needed for pain.    [provider]  albuterol  (VENTOLIN  HFA) 108 (90 Base) MCG/ACT inhaler Inhale 2 puffs into the lungs every 2 (two) hours as needed for wheezing or shortness of breath. 11/24/21   Tobie Calix, MD  aspirin  EC 81 MG tablet Take 81 mg by mouth daily.    [provider]  Calcium  Carbonate-Simethicone  (ALKA-SELTZER HEARTBURN + GAS) 750-80 MG CHEW Chew 2 each by mouth daily as needed (gas/heartburn).    [provider]  diclofenac  Sodium (VOLTAREN ) 1 % GEL Apply 1 application topically daily as needed (pain).    [provider]  empagliflozin  (JARDIANCE ) 25 MG TABS tablet Take 25 mg by mouth daily.    [provider]  fluorouracil  (EFUDEX ) 5 % cream Apply the cream twice per day to the area where the skin cancer was and a quarter inch around that area. Apply until the redness and irritation develop (usually occurs by day 7), then stop and allow it to heal. Protect the area from sunlight while it is healing with a hat or SPF30+  sunscreen. 01/27/24   Claudene Lehmann, MD  furosemide  (LASIX ) 20 MG tablet Take 1 tablet (20 mg total) by mouth daily. 01/16/24 01/15/25  Claudene Rover, MD  gabapentin  (NEURONTIN ) 300 MG capsule Take 300 mg by mouth at bedtime.    [provider]  glipiZIDE (GLUCOTROL) 10 MG tablet Take 10 mg by mouth 2 (two) times daily with a meal.     [provider]  Inulin  (FIBER CHOICE PO) Take 5 tablets by mouth with breakfast, with lunch, and with evening meal.    [provider]  isosorbide  mononitrate (IMDUR ) 60 MG 24 hr tablet Take 60 mg by mouth daily.    [provider]  levocetirizine (XYZAL) 5 MG tablet Take 5 mg by mouth every evening. 11/27/20   [provider]  levothyroxine  (SYNTHROID ) 75 MCG tablet Take 75 mcg by mouth daily before breakfast.     [provider]  Melatonin 10 MG TABS Take 10 mg by mouth at  bedtime as needed (sleep).    [provider]  metFORMIN (GLUCOPHAGE-XR) 500 MG 24 hr tablet Take 1 tablet by mouth 2 (two) times daily. 05/12/23 05/11/24  [provider]  Multiple Vitamins-Minerals (PRESERVISION AREDS 2+MULTI VIT PO) Take 1 capsule by mouth in the morning and at bedtime.    [provider]  omeprazole (PRILOSEC) 40 MG capsule Take 40 mg by mouth daily.     [provider]  rosuvastatin  (CRESTOR ) 20 MG tablet Take 20 mg by mouth daily. 08/20/23 08/19/24  [provider]  SENNA PLUS 8.6-50 MG tablet Take 2 tablets by mouth 2 (two) times daily.    [provider]  triamcinolone  ointment (KENALOG ) 0.1 % Apply 1 Application topically 2 (two) times daily. 09/24/23   Brimage, Vondra, DO  VYNDAMAX 61 MG CAPS Take 61 mg by mouth daily. 07/19/22   [provider]    Family History Family History  Problem Relation Age of Onset   Pancreatic cancer Mother    Liver cancer Mother    Heart disease Mother     Social History Social History[1]   Allergies   Levofloxacin,  Levaquin [levofloxacin in d5w], and Sitagliptin   Review of Systems Review of Systems  Musculoskeletal:  Negative for arthralgias and joint swelling.  Skin:  Positive for color change and wound.  Neurological:  Negative for syncope, weakness, numbness and headaches.     Physical Exam Triage Vital Signs ED Triage Vitals  Encounter Vitals Group     BP 08/06/24 1052 116/74     Girls Systolic BP Percentile --      Girls Diastolic BP Percentile --      Boys Systolic BP Percentile --      Boys Diastolic BP Percentile --      Pulse Rate 08/06/24 1052 77     Resp 08/06/24 1052 15     Temp 08/06/24 1052 97.6 F (36.4 C)     Temp Source 08/06/24 1052 Oral     SpO2 08/06/24 1052 100 %     Weight 08/06/24 1051 136 lb 0.4 oz (61.7 kg)     Height 08/06/24 1051 5' 10 (1.778 m)     Head Circumference --      Peak Flow --      Pain Score 08/06/24 1051 5     Pain Loc --      Pain Education --      Exclude from Growth Chart --    No data found.  Updated Vital Signs BP 116/74 (BP Location: Left Arm)   Pulse 77   Temp 97.6 F (36.4 C) (Oral)   Resp 15   Ht 5' 10 (1.778 m)   Wt 136 lb 0.4 oz (61.7 kg)   SpO2 100%   BMI 19.52 kg/m    Physical Exam Vitals and nursing note reviewed.  Constitutional:      General: He is not in acute distress.    Appearance: Normal appearance. He is well-developed. He is not ill-appearing.  HENT:     Head: Normocephalic and atraumatic.     Nose: Nose normal.     Mouth/Throat:     Mouth: Mucous membranes are moist.     Pharynx: Oropharynx is clear.  Eyes:     Extraocular Movements: Extraocular movements intact.     Conjunctiva/sclera: Conjunctivae normal.     Pupils: Pupils are equal, round, and reactive to light.  Cardiovascular:     Rate and Rhythm: Normal rate and  regular rhythm.  Pulmonary:     Effort: Pulmonary effort is normal. No respiratory distress.     Breath sounds: Normal breath sounds.  Abdominal:     Palpations: Abdomen is  soft.     Tenderness: There is no abdominal tenderness.  Musculoskeletal:     Cervical back: Neck supple.  Skin:    General: Skin is warm and dry.     Capillary Refill: Capillary refill takes less than 2 seconds.     Comments: 2 inch scabbed/clotted laceration right dorsal forearm. No bony tenderness.  Neurological:     General: No focal deficit present.     Mental Status: He is alert and oriented to person, place, and time. Mental status is at baseline.     Cranial Nerves: No cranial nerve deficit.     Motor: No weakness.     Coordination: Coordination normal.     Gait: Gait normal.  Psychiatric:        Mood and Affect: Mood normal.      UC Treatments / Results  Labs (all labs ordered are listed, but only abnormal results are displayed) Labs Reviewed - No data to display  EKG   Radiology No results found.  Procedures Procedures (including critical care time)  Medications Ordered in UC Medications - No data to display  Initial Impression / Assessment and Plan / UC Course  I have reviewed the triage vital signs and the nursing notes.  Pertinent labs & imaging results that were available during my care of the patient were reviewed by me and considered in my medical decision making (see chart for details).   89 y/o male presents with wife for laceration of the right forearm that occurred 2 days ago when he tried to catch her if she was falling.  He denies any other injuries and does not have any swelling of the affected arm.  He has applied a powder clotting substance in the area has stopped bleeding.  Denies any significant pain.  He thought since his wife is getting checked out he would have his wound assessed as well.  See image included in chart.  I cleaned the wound with wound cleanser and applied topical bacitracin, nonadherent pad and Coban.  No bony tenderness and full range of motion of arm so doubtful underlying fracture.  Discussed wound care guidelines with  patient advised him to return if increased redness, swelling, pustular drainage, pain, red streak up arm.   Final Clinical Impressions(s) / UC Diagnoses   Final diagnoses:  Laceration of right forearm, initial encounter     Discharge Instructions      -You are advised to return if increased redness, swelling, pustular drainage, pain, red streak up arm.     ED Prescriptions   None    PDMP not reviewed this encounter.     [1]  Social History Tobacco Use   Smoking status: Every Day    Types: Cigars   Smokeless tobacco: Current   Tobacco comments:    He smokes cigars occassionally  Vaping Use   Vaping status: Never Used  Substance Use Topics   Alcohol use: No   Drug use: No     Arvis Jolan NOVAK, PA-C 08/06/24 1135  "

## 2024-08-06 NOTE — ED Triage Notes (Signed)
 Patient states that her was trying to catch his wife from falling on Wed and cut his right forearm.  Patient has a tender skin tear on his right forearm.  Patient is only on a baby aspirin .
